# Patient Record
Sex: Female | Born: 1975 | Hispanic: Yes | Marital: Married | Smoking: Never smoker
Health system: Southern US, Community
[De-identification: ages and names within clinical notes are randomized; demographics above are authoritative.]

## PROBLEM LIST (undated history)

## (undated) DIAGNOSIS — Z789 Other specified health status: Secondary | ICD-10-CM

## (undated) HISTORY — DX: Other specified health status: Z78.9

---

## 1998-01-12 ENCOUNTER — Inpatient Hospital Stay (HOSPITAL_COMMUNITY): Admission: AD | Admit: 1998-01-12 | Discharge: 1998-01-12 | Payer: Self-pay | Admitting: *Deleted

## 1998-01-13 ENCOUNTER — Encounter: Payer: Self-pay | Admitting: *Deleted

## 1998-01-15 ENCOUNTER — Inpatient Hospital Stay (HOSPITAL_COMMUNITY): Admission: AD | Admit: 1998-01-15 | Discharge: 1998-01-19 | Payer: Self-pay | Admitting: Obstetrics

## 1998-02-11 ENCOUNTER — Encounter: Admission: RE | Admit: 1998-02-11 | Discharge: 1998-02-11 | Payer: Self-pay | Admitting: Obstetrics

## 1998-03-05 ENCOUNTER — Inpatient Hospital Stay: Admission: AD | Admit: 1998-03-05 | Discharge: 1998-03-05 | Payer: Self-pay | Admitting: Obstetrics & Gynecology

## 1998-04-09 ENCOUNTER — Encounter: Admission: RE | Admit: 1998-04-09 | Discharge: 1998-04-09 | Payer: Self-pay | Admitting: Internal Medicine

## 1998-04-16 ENCOUNTER — Ambulatory Visit (HOSPITAL_COMMUNITY): Admission: RE | Admit: 1998-04-16 | Discharge: 1998-04-16 | Payer: Self-pay | Admitting: *Deleted

## 1998-05-06 ENCOUNTER — Encounter: Admission: RE | Admit: 1998-05-06 | Discharge: 1998-05-06 | Payer: Self-pay | Admitting: Obstetrics

## 1998-05-11 ENCOUNTER — Encounter: Admission: RE | Admit: 1998-05-11 | Discharge: 1998-05-11 | Payer: Self-pay | Admitting: Internal Medicine

## 1998-05-21 ENCOUNTER — Encounter: Admission: RE | Admit: 1998-05-21 | Discharge: 1998-05-21 | Payer: Self-pay | Admitting: Internal Medicine

## 1998-08-12 ENCOUNTER — Encounter: Admission: RE | Admit: 1998-08-12 | Discharge: 1998-08-12 | Payer: Self-pay | Admitting: Obstetrics

## 1998-08-20 ENCOUNTER — Inpatient Hospital Stay (HOSPITAL_COMMUNITY): Admission: AD | Admit: 1998-08-20 | Discharge: 1998-08-20 | Payer: Self-pay | Admitting: *Deleted

## 1998-10-08 ENCOUNTER — Ambulatory Visit (HOSPITAL_COMMUNITY): Admission: RE | Admit: 1998-10-08 | Discharge: 1998-10-08 | Payer: Self-pay | Admitting: Obstetrics

## 1998-12-14 ENCOUNTER — Ambulatory Visit (HOSPITAL_COMMUNITY): Admission: RE | Admit: 1998-12-14 | Discharge: 1998-12-14 | Payer: Self-pay | Admitting: *Deleted

## 1999-03-15 ENCOUNTER — Inpatient Hospital Stay (HOSPITAL_COMMUNITY): Admission: AD | Admit: 1999-03-15 | Discharge: 1999-03-15 | Payer: Self-pay | Admitting: *Deleted

## 1999-03-24 ENCOUNTER — Inpatient Hospital Stay (HOSPITAL_COMMUNITY): Admission: AD | Admit: 1999-03-24 | Discharge: 1999-03-24 | Payer: Self-pay | Admitting: *Deleted

## 1999-03-28 ENCOUNTER — Encounter (HOSPITAL_COMMUNITY): Admission: AD | Admit: 1999-03-28 | Discharge: 1999-03-30 | Payer: Self-pay | Admitting: Obstetrics

## 1999-03-29 ENCOUNTER — Inpatient Hospital Stay (HOSPITAL_COMMUNITY): Admission: AD | Admit: 1999-03-29 | Discharge: 1999-03-31 | Payer: Self-pay | Admitting: Obstetrics

## 2003-12-08 ENCOUNTER — Ambulatory Visit: Payer: Self-pay | Admitting: Family Medicine

## 2003-12-18 ENCOUNTER — Ambulatory Visit: Payer: Self-pay | Admitting: Family Medicine

## 2004-03-07 ENCOUNTER — Emergency Department (HOSPITAL_COMMUNITY): Admission: EM | Admit: 2004-03-07 | Discharge: 2004-03-07 | Payer: Self-pay | Admitting: Emergency Medicine

## 2004-03-23 ENCOUNTER — Ambulatory Visit: Payer: Self-pay | Admitting: Family Medicine

## 2004-03-30 ENCOUNTER — Ambulatory Visit: Payer: Self-pay | Admitting: Family Medicine

## 2004-04-14 ENCOUNTER — Ambulatory Visit: Payer: Self-pay | Admitting: Family Medicine

## 2004-05-03 ENCOUNTER — Ambulatory Visit: Payer: Self-pay | Admitting: Sports Medicine

## 2004-06-02 ENCOUNTER — Ambulatory Visit: Payer: Self-pay | Admitting: Family Medicine

## 2004-06-03 ENCOUNTER — Ambulatory Visit (HOSPITAL_COMMUNITY): Admission: RE | Admit: 2004-06-03 | Discharge: 2004-06-03 | Payer: Self-pay | Admitting: Family Medicine

## 2004-06-10 ENCOUNTER — Ambulatory Visit (HOSPITAL_COMMUNITY): Admission: RE | Admit: 2004-06-10 | Discharge: 2004-06-10 | Payer: Self-pay | Admitting: Family Medicine

## 2004-06-11 ENCOUNTER — Ambulatory Visit: Payer: Self-pay | Admitting: Obstetrics and Gynecology

## 2004-06-11 ENCOUNTER — Ambulatory Visit (HOSPITAL_COMMUNITY): Admission: RE | Admit: 2004-06-11 | Discharge: 2004-06-11 | Payer: Self-pay | Admitting: Obstetrics & Gynecology

## 2004-06-28 ENCOUNTER — Ambulatory Visit: Payer: Self-pay | Admitting: Obstetrics & Gynecology

## 2004-07-01 ENCOUNTER — Ambulatory Visit: Payer: Self-pay | Admitting: Family Medicine

## 2004-09-15 ENCOUNTER — Ambulatory Visit: Payer: Self-pay | Admitting: Family Medicine

## 2004-09-21 ENCOUNTER — Ambulatory Visit: Payer: Self-pay | Admitting: Family Medicine

## 2004-10-11 ENCOUNTER — Ambulatory Visit: Payer: Self-pay | Admitting: Family Medicine

## 2004-10-29 ENCOUNTER — Inpatient Hospital Stay (HOSPITAL_COMMUNITY): Admission: AD | Admit: 2004-10-29 | Discharge: 2004-10-29 | Payer: Self-pay | Admitting: Obstetrics & Gynecology

## 2004-10-31 ENCOUNTER — Inpatient Hospital Stay (HOSPITAL_COMMUNITY): Admission: AD | Admit: 2004-10-31 | Discharge: 2004-10-31 | Payer: Self-pay | Admitting: Obstetrics and Gynecology

## 2004-11-02 ENCOUNTER — Inpatient Hospital Stay (HOSPITAL_COMMUNITY): Admission: AD | Admit: 2004-11-02 | Discharge: 2004-11-02 | Payer: Self-pay | Admitting: Obstetrics and Gynecology

## 2004-11-15 ENCOUNTER — Ambulatory Visit: Payer: Self-pay | Admitting: Obstetrics and Gynecology

## 2004-12-02 ENCOUNTER — Ambulatory Visit: Payer: Self-pay | Admitting: *Deleted

## 2005-08-01 ENCOUNTER — Ambulatory Visit: Payer: Self-pay | Admitting: Family Medicine

## 2005-08-16 ENCOUNTER — Ambulatory Visit (HOSPITAL_COMMUNITY): Admission: RE | Admit: 2005-08-16 | Discharge: 2005-08-16 | Payer: Self-pay | Admitting: Obstetrics & Gynecology

## 2005-08-30 ENCOUNTER — Ambulatory Visit: Payer: Self-pay | Admitting: Family Medicine

## 2005-09-28 ENCOUNTER — Ambulatory Visit: Payer: Self-pay | Admitting: Family Medicine

## 2005-10-02 ENCOUNTER — Ambulatory Visit (HOSPITAL_COMMUNITY): Admission: RE | Admit: 2005-10-02 | Discharge: 2005-10-02 | Payer: Self-pay | Admitting: Obstetrics & Gynecology

## 2005-10-23 ENCOUNTER — Ambulatory Visit: Payer: Self-pay | Admitting: Family Medicine

## 2005-10-26 ENCOUNTER — Ambulatory Visit: Payer: Self-pay | Admitting: Family Medicine

## 2005-11-02 ENCOUNTER — Ambulatory Visit: Payer: Self-pay | Admitting: Family Medicine

## 2005-12-05 ENCOUNTER — Ambulatory Visit: Payer: Self-pay | Admitting: Family Medicine

## 2005-12-12 ENCOUNTER — Ambulatory Visit (HOSPITAL_COMMUNITY): Admission: RE | Admit: 2005-12-12 | Discharge: 2005-12-12 | Payer: Self-pay | Admitting: Obstetrics & Gynecology

## 2005-12-18 ENCOUNTER — Ambulatory Visit: Payer: Self-pay | Admitting: Sports Medicine

## 2006-01-05 ENCOUNTER — Ambulatory Visit: Payer: Self-pay | Admitting: Family Medicine

## 2006-01-10 ENCOUNTER — Ambulatory Visit: Payer: Self-pay | Admitting: Family Medicine

## 2006-01-16 HISTORY — PX: DILATION AND CURETTAGE OF UTERUS: SHX78

## 2006-01-18 ENCOUNTER — Ambulatory Visit: Payer: Self-pay | Admitting: Family Medicine

## 2006-01-25 ENCOUNTER — Ambulatory Visit: Payer: Self-pay | Admitting: Family Medicine

## 2006-01-29 ENCOUNTER — Ambulatory Visit: Payer: Self-pay | Admitting: Obstetrics and Gynecology

## 2006-01-29 ENCOUNTER — Inpatient Hospital Stay (HOSPITAL_COMMUNITY): Admission: AD | Admit: 2006-01-29 | Discharge: 2006-01-29 | Payer: Self-pay | Admitting: Gynecology

## 2006-02-02 ENCOUNTER — Ambulatory Visit: Payer: Self-pay | Admitting: Family Medicine

## 2006-02-13 ENCOUNTER — Ambulatory Visit: Payer: Self-pay | Admitting: Family Medicine

## 2006-02-16 ENCOUNTER — Ambulatory Visit: Payer: Self-pay | Admitting: Family Medicine

## 2006-02-16 ENCOUNTER — Inpatient Hospital Stay (HOSPITAL_COMMUNITY): Admission: AD | Admit: 2006-02-16 | Discharge: 2006-02-18 | Payer: Self-pay | Admitting: Obstetrics & Gynecology

## 2006-02-16 ENCOUNTER — Ambulatory Visit: Payer: Self-pay | Admitting: *Deleted

## 2006-04-16 ENCOUNTER — Ambulatory Visit: Payer: Self-pay | Admitting: Sports Medicine

## 2006-04-16 ENCOUNTER — Encounter: Payer: Self-pay | Admitting: Family Medicine

## 2006-04-16 LAB — CONVERTED CEMR LAB
MCV: 90.4 fL
Platelets: 304 10*3/uL
RBC: 4.39 M/uL
TSH: 1.323 microintl units/mL (ref 0.350–5.50)

## 2006-05-17 ENCOUNTER — Ambulatory Visit: Payer: Self-pay | Admitting: Sports Medicine

## 2006-06-14 ENCOUNTER — Telehealth (INDEPENDENT_AMBULATORY_CARE_PROVIDER_SITE_OTHER): Payer: Self-pay | Admitting: *Deleted

## 2006-06-15 ENCOUNTER — Encounter (INDEPENDENT_AMBULATORY_CARE_PROVIDER_SITE_OTHER): Payer: Self-pay | Admitting: Family Medicine

## 2006-06-15 ENCOUNTER — Ambulatory Visit: Payer: Self-pay | Admitting: Family Medicine

## 2006-06-15 DIAGNOSIS — Q828 Other specified congenital malformations of skin: Secondary | ICD-10-CM | POA: Insufficient documentation

## 2006-06-15 LAB — CONVERTED CEMR LAB
Bilirubin Urine: NEGATIVE
GC Probe Amp, Genital: NEGATIVE
Ketones, urine, test strip: NEGATIVE
Nitrite: NEGATIVE
Specific Gravity, Urine: 1.02
Urobilinogen, UA: 0.2
Whiff Test: NEGATIVE

## 2006-06-27 ENCOUNTER — Telehealth (INDEPENDENT_AMBULATORY_CARE_PROVIDER_SITE_OTHER): Payer: Self-pay | Admitting: Family Medicine

## 2006-07-06 ENCOUNTER — Encounter (INDEPENDENT_AMBULATORY_CARE_PROVIDER_SITE_OTHER): Payer: Self-pay | Admitting: Family Medicine

## 2006-07-06 ENCOUNTER — Ambulatory Visit: Payer: Self-pay | Admitting: Family Medicine

## 2006-07-06 LAB — CONVERTED CEMR LAB
Beta hcg, urine, semiquantitative: NEGATIVE
Bilirubin Urine: NEGATIVE
Glucose, Urine, Semiquant: NEGATIVE
Specific Gravity, Urine: 1.025
pH: 6

## 2006-09-24 ENCOUNTER — Ambulatory Visit: Payer: Self-pay | Admitting: Sports Medicine

## 2006-09-24 LAB — CONVERTED CEMR LAB
KOH Prep: POSITIVE
Whiff Test: NEGATIVE

## 2006-10-02 ENCOUNTER — Encounter (INDEPENDENT_AMBULATORY_CARE_PROVIDER_SITE_OTHER): Payer: Self-pay | Admitting: Family Medicine

## 2006-10-03 ENCOUNTER — Encounter (INDEPENDENT_AMBULATORY_CARE_PROVIDER_SITE_OTHER): Payer: Self-pay | Admitting: Family Medicine

## 2006-10-03 ENCOUNTER — Ambulatory Visit (HOSPITAL_COMMUNITY): Admission: RE | Admit: 2006-10-03 | Discharge: 2006-10-03 | Payer: Self-pay | Admitting: Sports Medicine

## 2006-11-01 ENCOUNTER — Encounter: Payer: Self-pay | Admitting: Family Medicine

## 2006-11-01 ENCOUNTER — Ambulatory Visit: Payer: Self-pay | Admitting: Family Medicine

## 2006-11-01 LAB — CONVERTED CEMR LAB
Basophils Absolute: 0 10*3/uL (ref 0.0–0.1)
Eosinophils Relative: 1 % (ref 0–5)
Glucose, Urine, Semiquant: NEGATIVE
Hepatitis B Surface Ag: NEGATIVE
Lymphocytes Relative: 26 % (ref 12–46)
Neutro Abs: 5.3 10*3/uL (ref 1.7–7.7)
Neutrophils Relative %: 67 % (ref 43–77)
Platelets: 301 10*3/uL (ref 150–400)
Protein, U semiquant: NEGATIVE
RDW: 15.5 % — ABNORMAL HIGH (ref 11.5–14.0)
Rubella: 64.7 intl units/mL — ABNORMAL HIGH
WBC: 7.8 10*3/uL (ref 4.0–10.5)

## 2006-11-15 ENCOUNTER — Ambulatory Visit: Payer: Self-pay | Admitting: Family Medicine

## 2006-11-15 ENCOUNTER — Encounter (INDEPENDENT_AMBULATORY_CARE_PROVIDER_SITE_OTHER): Payer: Self-pay | Admitting: Family Medicine

## 2006-11-15 ENCOUNTER — Encounter: Payer: Self-pay | Admitting: Family Medicine

## 2006-11-15 LAB — CONVERTED CEMR LAB
Chlamydia, DNA Probe: NEGATIVE
GC Probe Amp, Genital: NEGATIVE
Pap Smear: NORMAL
Whiff Test: NEGATIVE

## 2006-12-05 ENCOUNTER — Encounter (INDEPENDENT_AMBULATORY_CARE_PROVIDER_SITE_OTHER): Payer: Self-pay | Admitting: Family Medicine

## 2006-12-05 ENCOUNTER — Ambulatory Visit (HOSPITAL_COMMUNITY): Admission: RE | Admit: 2006-12-05 | Discharge: 2006-12-05 | Payer: Self-pay | Admitting: Family Medicine

## 2006-12-18 ENCOUNTER — Ambulatory Visit: Payer: Self-pay | Admitting: Family Medicine

## 2006-12-18 LAB — CONVERTED CEMR LAB
Glucose, Urine, Semiquant: NEGATIVE
Protein, U semiquant: NEGATIVE

## 2007-01-22 ENCOUNTER — Ambulatory Visit: Payer: Self-pay | Admitting: Family Medicine

## 2007-01-22 ENCOUNTER — Encounter (INDEPENDENT_AMBULATORY_CARE_PROVIDER_SITE_OTHER): Payer: Self-pay | Admitting: Family Medicine

## 2007-01-22 ENCOUNTER — Encounter: Payer: Self-pay | Admitting: *Deleted

## 2007-01-22 LAB — CONVERTED CEMR LAB
Hemoglobin: 12.5 g/dL (ref 12.0–15.0)
Lymphocytes Relative: 18 % (ref 12–46)
Monocytes Absolute: 1 10*3/uL (ref 0.1–1.0)
Monocytes Relative: 9 % (ref 3–12)
Neutro Abs: 8.3 10*3/uL — ABNORMAL HIGH (ref 1.7–7.7)
Nitrite: POSITIVE
Protein, U semiquant: 30
RBC: 4.07 M/uL (ref 3.87–5.11)
Specific Gravity, Urine: 1.015

## 2007-01-23 ENCOUNTER — Ambulatory Visit: Payer: Self-pay | Admitting: Family Medicine

## 2007-02-06 ENCOUNTER — Ambulatory Visit: Payer: Self-pay | Admitting: Family Medicine

## 2007-02-06 ENCOUNTER — Encounter: Payer: Self-pay | Admitting: Family Medicine

## 2007-02-07 ENCOUNTER — Encounter (INDEPENDENT_AMBULATORY_CARE_PROVIDER_SITE_OTHER): Payer: Self-pay | Admitting: Family Medicine

## 2007-02-25 ENCOUNTER — Ambulatory Visit: Payer: Self-pay | Admitting: Family Medicine

## 2007-02-25 ENCOUNTER — Encounter: Payer: Self-pay | Admitting: Family Medicine

## 2007-02-25 LAB — CONVERTED CEMR LAB: Glucose, Urine, Semiquant: NEGATIVE

## 2007-03-05 ENCOUNTER — Encounter (INDEPENDENT_AMBULATORY_CARE_PROVIDER_SITE_OTHER): Payer: Self-pay | Admitting: Family Medicine

## 2007-03-05 ENCOUNTER — Ambulatory Visit: Payer: Self-pay | Admitting: Family Medicine

## 2007-03-06 LAB — CONVERTED CEMR LAB: Glucose, 2 hour: 125 mg/dL (ref 70–139)

## 2007-03-13 ENCOUNTER — Ambulatory Visit: Payer: Self-pay | Admitting: Family Medicine

## 2007-03-13 ENCOUNTER — Encounter: Payer: Self-pay | Admitting: Family Medicine

## 2007-03-13 LAB — CONVERTED CEMR LAB

## 2007-04-01 ENCOUNTER — Ambulatory Visit: Payer: Self-pay | Admitting: Sports Medicine

## 2007-04-16 ENCOUNTER — Encounter (INDEPENDENT_AMBULATORY_CARE_PROVIDER_SITE_OTHER): Payer: Self-pay | Admitting: Family Medicine

## 2007-04-16 ENCOUNTER — Ambulatory Visit: Payer: Self-pay | Admitting: Family Medicine

## 2007-04-16 LAB — CONVERTED CEMR LAB: GC Probe Amp, Genital: NEGATIVE

## 2007-04-24 ENCOUNTER — Ambulatory Visit: Payer: Self-pay | Admitting: Family Medicine

## 2007-04-24 LAB — CONVERTED CEMR LAB: Protein, U semiquant: NEGATIVE

## 2007-04-30 ENCOUNTER — Inpatient Hospital Stay (HOSPITAL_COMMUNITY): Admission: AD | Admit: 2007-04-30 | Discharge: 2007-05-02 | Payer: Self-pay | Admitting: Obstetrics and Gynecology

## 2007-04-30 ENCOUNTER — Ambulatory Visit: Payer: Self-pay | Admitting: *Deleted

## 2007-04-30 ENCOUNTER — Ambulatory Visit: Payer: Self-pay | Admitting: Obstetrics and Gynecology

## 2007-04-30 ENCOUNTER — Encounter (INDEPENDENT_AMBULATORY_CARE_PROVIDER_SITE_OTHER): Payer: Self-pay | Admitting: Family Medicine

## 2007-05-08 ENCOUNTER — Telehealth: Payer: Self-pay | Admitting: *Deleted

## 2007-05-10 ENCOUNTER — Ambulatory Visit: Payer: Self-pay | Admitting: Family Medicine

## 2007-05-10 ENCOUNTER — Encounter (INDEPENDENT_AMBULATORY_CARE_PROVIDER_SITE_OTHER): Payer: Self-pay | Admitting: Family Medicine

## 2007-05-11 LAB — CONVERTED CEMR LAB
Basophils Absolute: 0 K/uL (ref 0.0–0.1)
Basophils Relative: 0 % (ref 0–1)
Eosinophils Absolute: 0.1 K/uL (ref 0.0–0.7)
Eosinophils Relative: 1 % (ref 0–5)
HCT: 36.2 % (ref 36.0–46.0)
Hemoglobin: 11.7 g/dL — ABNORMAL LOW (ref 12.0–15.0)
Lymphocytes Relative: 25 % (ref 12–46)
Lymphs Abs: 2.5 K/uL (ref 0.7–4.0)
MCHC: 32.3 g/dL (ref 30.0–36.0)
MCV: 94.3 fL (ref 78.0–100.0)
Monocytes Absolute: 0.6 K/uL (ref 0.1–1.0)
Monocytes Relative: 6 % (ref 3–12)
Neutro Abs: 7 K/uL (ref 1.7–7.7)
Neutrophils Relative %: 68 % (ref 43–77)
Platelets: 456 K/uL — ABNORMAL HIGH (ref 150–400)
RBC: 3.84 M/uL — ABNORMAL LOW (ref 3.87–5.11)
RDW: 15.2 % (ref 11.5–15.5)
WBC: 10.2 10*3/microliter (ref 4.0–10.5)

## 2007-06-13 ENCOUNTER — Ambulatory Visit: Payer: Self-pay | Admitting: Family Medicine

## 2007-06-21 ENCOUNTER — Encounter: Payer: Self-pay | Admitting: *Deleted

## 2007-07-02 ENCOUNTER — Ambulatory Visit: Payer: Self-pay | Admitting: Family Medicine

## 2007-07-02 ENCOUNTER — Encounter: Payer: Self-pay | Admitting: Family Medicine

## 2007-07-02 ENCOUNTER — Encounter (INDEPENDENT_AMBULATORY_CARE_PROVIDER_SITE_OTHER): Payer: Self-pay | Admitting: Family Medicine

## 2007-07-02 LAB — CONVERTED CEMR LAB: Beta hcg, urine, semiquantitative: NEGATIVE

## 2007-07-09 ENCOUNTER — Encounter: Payer: Self-pay | Admitting: *Deleted

## 2007-08-12 ENCOUNTER — Ambulatory Visit: Payer: Self-pay | Admitting: Family Medicine

## 2008-06-08 ENCOUNTER — Encounter (INDEPENDENT_AMBULATORY_CARE_PROVIDER_SITE_OTHER): Payer: Self-pay | Admitting: Family Medicine

## 2008-06-08 ENCOUNTER — Ambulatory Visit: Payer: Self-pay | Admitting: Family Medicine

## 2008-06-08 DIAGNOSIS — N898 Other specified noninflammatory disorders of vagina: Secondary | ICD-10-CM | POA: Insufficient documentation

## 2008-06-08 LAB — CONVERTED CEMR LAB: Hemoglobin: 14.5 g/dL

## 2008-06-10 ENCOUNTER — Encounter (INDEPENDENT_AMBULATORY_CARE_PROVIDER_SITE_OTHER): Payer: Self-pay | Admitting: Family Medicine

## 2010-10-11 LAB — CBC
HCT: 39.7
HCT: 27 — ABNORMAL LOW
HCT: 30.8 — ABNORMAL LOW
Hemoglobin: 13.8
Hemoglobin: 10.8 — ABNORMAL LOW
Hemoglobin: 9.5 — ABNORMAL LOW
MCHC: 35.2
MCHC: 35.3
MCV: 91.6
MCV: 91.9
Platelets: 230
RBC: 4.33
RBC: 2.93 — ABNORMAL LOW
RDW: 15.5
RDW: 15.9 — ABNORMAL HIGH
WBC: 8.9

## 2012-02-16 ENCOUNTER — Encounter: Payer: Self-pay | Admitting: Family Medicine

## 2012-02-16 ENCOUNTER — Ambulatory Visit (INDEPENDENT_AMBULATORY_CARE_PROVIDER_SITE_OTHER): Payer: Self-pay | Admitting: Family Medicine

## 2012-02-16 VITALS — BP 119/76 | HR 75 | Temp 98.5°F | Ht 65.0 in | Wt 165.0 lb

## 2012-02-16 DIAGNOSIS — H6990 Unspecified Eustachian tube disorder, unspecified ear: Secondary | ICD-10-CM | POA: Insufficient documentation

## 2012-02-16 DIAGNOSIS — H6980 Other specified disorders of Eustachian tube, unspecified ear: Secondary | ICD-10-CM | POA: Insufficient documentation

## 2012-02-16 DIAGNOSIS — Z309 Encounter for contraceptive management, unspecified: Secondary | ICD-10-CM

## 2012-02-16 NOTE — Patient Instructions (Signed)
It was nice to see you.  Please fill out the paperwork for the Mirena, and also apply for the [employer].  If you are able to get the Mirena and the [employer] by April, we can do your Well Woman Exam, Pap smear, and change the Mirena during one visit.

## 2012-02-16 NOTE — Assessment & Plan Note (Signed)
No discharge or abnormalities on exam today- feel the popping is probably eustachian tube dysfunction, discussed connection of ears and nose, reassured her no infection.

## 2012-02-16 NOTE — Progress Notes (Signed)
  Subjective:    Patient ID: Teresa Orozco, female    DOB: 1975/11/27, 37 y.o.   MRN: [id number]  HPI:  Teresa Orozco comes in to re-establish care.  She has not been seen here in almost 5 years.  She has a Mirena in place, which will expire in April.  She says she would like a new Mirena if possible.   She complains about some popping and drainage from her right ear.  Denies pain. No fevers.  This comes and goes.   History reviewed. No pertinent past medical history.  History  Substance Use Topics  . Smoking status: Never Smoker   . Smokeless tobacco: Never Used  . Alcohol Use: No    History reviewed. No pertinent family history.   ROS Pertinent items in HPI    Objective:  Physical Exam:  BP 119/76  Pulse 75  Temp 98.5 F (36.9 C) (Oral)  Ht 5\' 5"  (1.651 m)  Wt 165 lb (74.844 kg)  BMI 27.46 kg/m2 General appearance: alert, cooperative and no distress Ears: Normal canals and TM's.  Head: Normocephalic, without obvious abnormality, atraumatic Lungs: clear to auscultation bilaterally Heart: regular rate and rhythm, S1, S2 normal, no murmur, click, rub or gallop Pulses: 2+ and symmetric       Assessment & Plan:

## 2012-02-16 NOTE — Assessment & Plan Note (Signed)
Discussed re-applying for Mirena scholarship to get a new one, and I have also asked her to apply for the orange card so we can do her Pap.  F/U in April before Mirena expires.

## 2012-03-22 ENCOUNTER — Ambulatory Visit: Payer: Self-pay | Admitting: Family Medicine

## 2012-04-10 ENCOUNTER — Ambulatory Visit: Payer: Self-pay | Admitting: Family Medicine

## 2012-04-17 ENCOUNTER — Ambulatory Visit (INDEPENDENT_AMBULATORY_CARE_PROVIDER_SITE_OTHER): Payer: Self-pay | Admitting: Family Medicine

## 2012-04-17 ENCOUNTER — Encounter: Payer: Self-pay | Admitting: Family Medicine

## 2012-04-17 VITALS — BP 106/71 | HR 72 | Temp 98.5°F | Ht 65.0 in | Wt 165.0 lb

## 2012-04-17 DIAGNOSIS — Z309 Encounter for contraceptive management, unspecified: Secondary | ICD-10-CM

## 2012-04-17 MED ORDER — LEVONORGESTREL 20 MCG/24HR IU IUD
INTRAUTERINE_SYSTEM | Freq: Once | INTRAUTERINE | Status: AC
  Administered 2012-04-17: 12:00:00 via INTRAUTERINE

## 2012-04-17 NOTE — Patient Instructions (Signed)
Intrauterine Device Insertion Care After Refer to this sheet in the next few weeks. These instructions provide you with information on caring for yourself after your procedure. Your caregiver may also give you more specific instructions. Your treatment has been planned according to current medical practices, but problems sometimes occur. Call your caregiver if you have any problems or questions after your procedure. HOME CARE INSTRUCTIONS   Only take over-the-counter or prescription medicines for pain, discomfort, or fever as directed by your caregiver. Do not use aspirin. This may increase bleeding.  Check your IUD to make sure it is in place before you resume sexual activity. You should be able to feel the strings. If you cannot feel the strings, something may be wrong. The IUD may have fallen out of the uterus, or the uterus may have been punctured (perforated) during placement. Also, if the strings are getting longer, it may mean that the IUD is being forced out of the uterus. You no longer have full protection from pregnancy if any of these problems occur.  You may resume sexual intercourse if you are not having problems with the IUD. The IUD is considered immediately effective.  You may resume normal activities.  Keep all follow-up appointments to be sure your IUD has remained in place. After the first exam, yearly exams are advised, unless you cannot feel the strings of your IUD.  Continue to check that the IUD is still in place by feeling for the strings after every menstrual period. SEEK MEDICAL CARE IF:   You have bleeding that is heavier or lasts longer than a normal menstrual cycle.  You have a fever.  You have increasing cramps or abdominal pain not relieved with medicine.  You have abdominal pain that does not seem to be related to the same area of earlier cramping and pain.  You are lightheaded, unusually weak, or faint.  You have abnormal vaginal discharge or  smells.  You have pain during sexual intercourse.  You cannot feel the IUD strings, or the IUD string has gotten longer.  You feel the IUD at the opening of the cervix in the vagina.  You think you are pregnant, or you miss your menstrual period.  The IUD string is hurting your sex partner. Document Released: 08/31/2010 Document Revised: 03/27/2011 Document Reviewed: 08/31/2010 [facility] Patient Information 2013 [place], [place].  Insercin de dispositivo intrauterino Cuidados despus de Consulte esta hoja en las prximas semanas . Estas instrucciones le proporcionan informacin sobre el cuidado de s mismo despus de su procedimiento. Su mdico tambin le puede dar instrucciones ms especficas . Su tratamiento se ha planificado de acuerdo a las prcticas mdicas actuales , pero los problemas a veces ocurren . Llame a su mdico si usted tiene algn problema o preguntas despus de su procedimiento. INSTRUCCIONES PARA EL CUIDADO DOMICILIARIO Slo tomar over-the- counter o medicamentos recetados para el dolor , la incomodidad, o fiebre como lo indique su mdico . No use aspirina. Esto puede aumentar el sangrado . Revise su DIU para asegurarse de que est en su lugar antes de reanudar la actividad sexual. Usted debe ser capaz de sentir las cuerdas. Si usted no puede sentir los hilos, algo puede estar mal . El DIU puede haber cado en el tero o el tero puede haber sido perforado ( perforada ) durante la colocacin. Adems, si las cuerdas se alargan, puede significar que el DIU est siendo forzado a salir del tero. Ya no tendr proteccin completa contra el embarazo si se  presenta cualquiera de estos problemas. Usted puede [employer] sexuales si no tiene problemas con el DIU. El DIU es considerada efectiva de inmediato . Puede reanudar sus actividades normales. Mantenga todas las citas de seguimiento para asegurarse de que su DIU se ha Museum/gallery conservator. Despus del primer examen,  se les recomienda realizarse exmenes anuales , a menos que usted no puede sentir los hilos del DIU . Contine revisando que el DIU sigue en su lugar por el sentimiento de las cuerdas despus de cada perodo menstrual. BUSQUE ATENCIN MDICA SI : Tiene sangrado es ms abundante o dura ms de un ciclo menstrual normal. Usted tiene una fiebre. Has aumentar los calambres o dolor abdominal que no se alivia con medicamentos. Tiene dolor abdominal que no parece estar relacionado con la misma rea de calambres antes y dolor. Usted est mareado , inusitadamente dbil o se desmaya . Tiene flujo vaginal anormal o olores. Tiene dolor durante las [employer] . Usted no puede sentir los hilos del DIU , o el hilo del DIU ha conseguido ya . Se siente el DIU en la abertura del cuello uterino en la vagina. Usted piensa que est [place] , o no tiene su perodo menstrual. El hilo del DIU est haciendo dao a su pareja sexual . Documento de lanzamiento : 08/31/2010 el documento revisado el : 03/27/2011 Documento Resea: 08/31/2010 [employer]  Informacin para el Paciente  [address] , [place] .

## 2012-04-17 NOTE — Progress Notes (Signed)
IUD Removal Procedure Note  Patient positioned in Lithotomy position, Speculum placed, Strings visualized and IUD removed easily with ringed forceps.   IUD Insertion Procedure Note  Pre-operative Diagnosis: Need for contraception  Post-operative Diagnosis: same  Indications: contraception  Procedure Details  Urine pregnancy test was done and result was Negative.  The risks (including infection, bleeding, pain, and uterine perforation) and benefits of the procedure were explained to the patient and Written informed consent was obtained.    Cervix cleansed with Betadine. Uterus sounded to 8 cm. IUD inserted without difficulty. String visible and trimmed. Patient tolerated procedure well.  IUD Information: Mirena.  Condition: Stable  Complications: None  Plan:  The patient was advised to call for any fever or for prolonged or severe pain or bleeding. She was advised to use NSAID as needed for mild to moderate pain.

## 2013-04-08 ENCOUNTER — Ambulatory Visit (INDEPENDENT_AMBULATORY_CARE_PROVIDER_SITE_OTHER): Payer: Self-pay | Admitting: Family Medicine

## 2013-04-08 ENCOUNTER — Encounter: Payer: Self-pay | Admitting: Family Medicine

## 2013-04-08 VITALS — BP 121/79 | HR 80 | Ht 65.0 in | Wt 163.9 lb

## 2013-04-08 DIAGNOSIS — Z309 Encounter for contraceptive management, unspecified: Secondary | ICD-10-CM

## 2013-04-08 DIAGNOSIS — T8332XA Displacement of intrauterine contraceptive device, initial encounter: Secondary | ICD-10-CM

## 2013-04-08 DIAGNOSIS — T8389XA Other specified complication of genitourinary prosthetic devices, implants and grafts, initial encounter: Principal | ICD-10-CM

## 2013-04-08 DIAGNOSIS — T8339XA Other mechanical complication of intrauterine contraceptive device, initial encounter: Secondary | ICD-10-CM

## 2013-04-08 LAB — POCT URINE PREGNANCY: PREG TEST UR: NEGATIVE

## 2013-04-08 NOTE — Assessment & Plan Note (Addendum)
A: suspected IUD migration. Patient and her sex partner cannot feel strings x 2 months. Unable to visualize on exam. P: Ultrasound evaluation Patient provided info to apply for orange card as she is currently uninsured.  PCP f/u to discuss removal, patient desires contraception will likely need gyn referral for IUD removal.  Advised patient to start PNV as she desires pregnancy in the near future.

## 2013-04-08 NOTE — Patient Instructions (Signed)
Teresa Orozco,   Thank you for coming in today. I have placed a referral for ultrasound.   Please f/u with Dr. Birdie SonsSonnenberg to discuss next steps for IUD removal. Please start prenatal vitamin as we discussed.   Dr. Armen PickupFunches

## 2013-04-08 NOTE — Progress Notes (Signed)
   Subjective:    Patient ID: Cheron SchaumannAna Bahena, female    DOB: 08/30/1975, 38 y.o.   MRN: [id number]  HPI 38 yo hispanic female presents for OV:  1. Check IUD: placed 04/2012. Has not felt strings x 2 months. Her sexual partner has not felt strings. Patient has menstrual cramps x one week. No vaginal bleeding. Has 4 children 17, 14, 7, 5. Desires pregnancy. Would like IUD removed if strings visualized today.   Soc hx: no smoking, no ETOH.  Review of Systems As per HPI     Objective:   Physical Exam BP 121/79  Pulse 80  Ht 5\' 5"  (1.651 m)  Wt 163 lb 14.4 oz (74.345 kg)  BMI 27.27 kg/m2 General appearance: alert, cooperative and no distress Abdomen: NABS, soft, NT, ND Pelvis:  Normal external genitalia. Speculum: normal vagina. Normal cervix with copious mucus and large ectropion. Nabothian cyst at 6 o'clock. Could not visualize IUD strings: explored cervix with cytobrush and q tips still could not visualize IUD strings.        Assessment & Plan:

## 2013-04-11 ENCOUNTER — Other Ambulatory Visit: Payer: Self-pay

## 2013-04-16 ENCOUNTER — Other Ambulatory Visit: Payer: Self-pay

## 2013-07-11 ENCOUNTER — Emergency Department (HOSPITAL_COMMUNITY): Payer: Self-pay

## 2013-07-11 ENCOUNTER — Encounter (HOSPITAL_COMMUNITY): Payer: Self-pay | Admitting: Emergency Medicine

## 2013-07-11 ENCOUNTER — Emergency Department (HOSPITAL_COMMUNITY)
Admission: EM | Admit: 2013-07-11 | Discharge: 2013-07-11 | Disposition: A | Discharge status: Home / Self Care | Payer: Self-pay | Attending: Emergency Medicine | Admitting: Emergency Medicine

## 2013-07-11 DIAGNOSIS — Y92410 Unspecified street and highway as the place of occurrence of the external cause: Secondary | ICD-10-CM | POA: Insufficient documentation

## 2013-07-11 DIAGNOSIS — IMO0002 Reserved for concepts with insufficient information to code with codable children: Secondary | ICD-10-CM | POA: Insufficient documentation

## 2013-07-11 DIAGNOSIS — Y9389 Activity, other specified: Secondary | ICD-10-CM | POA: Insufficient documentation

## 2013-07-11 DIAGNOSIS — M549 Dorsalgia, unspecified: Secondary | ICD-10-CM

## 2013-07-11 DIAGNOSIS — R0789 Other chest pain: Secondary | ICD-10-CM

## 2013-07-11 DIAGNOSIS — S298XXA Other specified injuries of thorax, initial encounter: Secondary | ICD-10-CM | POA: Insufficient documentation

## 2013-07-11 MED ORDER — METHOCARBAMOL 500 MG PO TABS: 500.0000 mg | ORAL_TABLET | Freq: Four times a day (QID) | ORAL | Status: DC | PRN

## 2013-07-11 MED ORDER — IBUPROFEN 800 MG PO TABS: 800.0000 mg | ORAL_TABLET | Freq: Three times a day (TID) | ORAL | Status: DC | PRN
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
## 2013-07-20 NOTE — ED Provider Notes (Signed)
Medical screening examination/treatment/procedure(s) were performed by non-physician practitioner and as supervising physician I was immediately available for consultation/collaboration.   EKG Interpretation None       Conni Knighton, MD 07/20/13 2022 

## 2014-03-16 ENCOUNTER — Encounter: Payer: Self-pay | Admitting: Family Medicine

## 2014-03-16 ENCOUNTER — Ambulatory Visit (INDEPENDENT_AMBULATORY_CARE_PROVIDER_SITE_OTHER): Payer: Self-pay | Admitting: Family Medicine

## 2014-03-16 VITALS — BP 134/91 | HR 83 | Temp 98.1°F | Ht 65.0 in | Wt 163.0 lb

## 2014-03-16 DIAGNOSIS — T8332XD Displacement of intrauterine contraceptive device, subsequent encounter: Secondary | ICD-10-CM

## 2014-03-16 DIAGNOSIS — T8389XD Other specified complication of genitourinary prosthetic devices, implants and grafts, subsequent encounter: Secondary | ICD-10-CM

## 2014-03-16 NOTE — Progress Notes (Signed)
   Teresa Orozco is a 39 y.o. female [facility].   HPI:  IUD Migration Patient reports that she had Mirena placed 2 years ago. Around a year ago, she noticed that she nor her partner could feel the stringers so [facility]. At that time, the strings were unable to be visualized in the cervical os even after exploration with a cytobrush and q-tip. Patient was then referred to have an ultrasound evaluation, however never followed up. Patient presents today (1 year since last visit) for evaluation. States that she still has not felt strings. Currently wishes to have IUD removed. Has not had a period for the past year. No other vaginal bleeding or discharge. No fevers. No severe abdominal pain.   Patient wishes to have bedside ultrasound today.   ROS: As per HPI  Past Medical History - Reviewed and updated Patient Active Problem List   Diagnosis Date Noted  . 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 Encounter for contraceptive management 02/16/2012  . Eustachian tube dysfunction 02/16/2012  . KERATOSIS PILARIS 06/15/2006    Medications- reviewed and updated Current Outpatient Prescriptions  Medication Sig Dispense Refill  . acetaminophen (TYLENOL) 500 MG tablet Take 1,000 mg by mouth every 6 (six) hours as needed for headache (pain).    Marland Kitchen. ibuprofen (ADVIL,MOTRIN) 800 MG tablet Take 1 tablet (800 mg total) by mouth every 8 (eight) hours as needed for mild pain or moderate pain (Tome 1 tableta cada 8 horas si lo necesita para dolor). 15 tablet 0  . levonorgestrel (MIRENA) 20 MCG/24HR IUD 1 each by Intrauterine route once. Will expire April 2019.    . methocarbamol (ROBAXIN) 500 MG tablet Take 1 tablet (500 mg total) by mouth every 6 (six) hours as needed for muscle spasms (Tome una tableta cada 6 horas si lo necesita para dolor). 20 tablet 0   No current facility-administered medications for this visit.    Objective: Physical  Exam: BP 134/91 mmHg  Pulse 83  Temp(Src) 98.1 F (36.7 C) (Oral)  Ht 5\' 5"  (1.651 m)  Wt 163 lb (73.936 kg)  BMI 27.12 kg/m2  Gen: NAD, resting comfortably CV: RRR with no murmurs appreciated Lungs: NWOB, CTAB with no crackles, wheezes, or rhonchi Abdomen: Soft, Nontender, Nondistended. Ext: no edema Skin: warm, dry Neuro: grossly normal, moves all extremities  A/P: See problem list  IUD migration IUD unlikely to be expelled given that patient has not had period in over a year. No red flag symptoms for perforation. No speculum exam or ultrasound today as it will not change management as patient wishes to have IUD removed and strings were previously not visualized. Will refer to gynecology for extraction.   Precepted with Dr Jennette KettleNeal.     Orders Placed This Encounter  Procedures  . Ambulatory referral to Gynecology    Referral Priority:  Routine    Referral Type:  Consultation    Referral Reason:  Specialty Services Required    Requested Specialty:  Gynecology    Number of Visits Requested:  1    Caleb M. Jimmey RalphParker, MD [facility] Resident PGY-1 03/16/2014 11:15 AM

## 2014-03-16 NOTE — Assessment & Plan Note (Addendum)
IUD unlikely to be expelled given that patient has not had period in over a year. No red flag symptoms for perforation. No speculum exam or ultrasound today as it will not change management as patient wishes to have IUD removed and strings were previously not visualized. Will refer to gynecology for extraction.   Precepted with Dr Jennette KettleNeal.

## 2014-03-16 NOTE — Patient Instructions (Signed)
Thank you for coming to the clinic today. It was nice seeing you.  For your IUD, we [facility].

## 2014-03-20 ENCOUNTER — Telehealth: Payer: Self-pay | Admitting: General Practice

## 2014-03-20 ENCOUNTER — Encounter: Payer: Self-pay | Admitting: General Practice

## 2014-03-20 DIAGNOSIS — T8332XA Displacement of intrauterine contraceptive device, initial encounter: Secondary | ICD-10-CM

## 2014-03-20 DIAGNOSIS — T8389XA Other specified complication of genitourinary prosthetic devices, implants and grafts, initial encounter: Principal | ICD-10-CM

## 2014-03-20 NOTE — Telephone Encounter (Addendum)
Patient needs ultrasound prior to appt in office due to missing IUD strings. Scheduled ultrasound for 3/22 @ 1045. Called patient with Teresa PerkingMaria Orozco for interpreter and husband answered stating he currently was not with her and would call us back with her number. Patient's husband called back and provided number of [phone]. Called patient with Teresa PerkingMaria Orozco and informed patient of ultrasound scheduled for 3/22 @ 10:45 and appt in our office of 4/14 @ 12:45. Patient verbalized understanding to all and had no questions

## 2014-04-07 ENCOUNTER — Ambulatory Visit (HOSPITAL_COMMUNITY)
Admission: RE | Admit: 2014-04-07 | Discharge: 2014-04-07 | Disposition: A | Discharge status: Home / Self Care | Payer: Self-pay | Source: Ambulatory Visit | Attending: Family Medicine | Admitting: Family Medicine

## 2014-04-07 DIAGNOSIS — T8332XA Displacement of intrauterine contraceptive device, initial encounter: Secondary | ICD-10-CM

## 2014-04-07 DIAGNOSIS — T8389XA Other specified complication of genitourinary prosthetic devices, implants and grafts, initial encounter: Secondary | ICD-10-CM | POA: Insufficient documentation

## 2014-04-30 ENCOUNTER — Encounter: Payer: Self-pay | Admitting: Obstetrics & Gynecology

## 2014-04-30 ENCOUNTER — Ambulatory Visit (INDEPENDENT_AMBULATORY_CARE_PROVIDER_SITE_OTHER): Payer: Self-pay | Admitting: Obstetrics & Gynecology

## 2014-04-30 VITALS — BP 127/79 | HR 79 | Wt 168.0 lb

## 2014-04-30 DIAGNOSIS — Z30432 Encounter for removal of intrauterine contraceptive device: Secondary | ICD-10-CM

## 2014-04-30 MED ORDER — PRENATAL VITAMINS 0.8 MG PO TABS: 1.0000 | ORAL_TABLET | Freq: Every day | ORAL | Status: DC

## 2014-04-30 NOTE — Patient Instructions (Signed)
Prenatal Vitamin and Mineral Combinations (oral solid dosage forms) Qu es este medicamento? Las PG&E Corporationcombinaciones de VITAMINA Y MINERAL PRENATAL se utilizan [place]antes, durante y despus de un embarazo para ayudar a suministrar una buena nutricin. Este medicamento puede ser utilizado para otros usos; si tiene alguna pregunta consulte con su proveedor de atencin mdica o con su farmacutico. MARCAS COMERCIALES DISPONIBLES: Active OB, Advanced Care Plus, Advanced NatalCare, Advanced-RF NatalCare, Aminate Fe, Anemagen OB, B-Nexa, BP FoliNatal Plus B, BP MultiNatal Plus, BP MultiNatal Plus Chewable, BP Prenate, Brainstrong, Bright Beginnings Prenatal, Cal-Nate, Calcium PNV, CareNatal DHA, CareNate 600, Cavan One Omega, Cavan Prenatal with EC Calcium, Cavan-Alpha, Cavan-EC SOD DHA, Cavan-Heme OB, Cavan-Heme Omega, Cenogen Ultra, Administrator, sportsCentrum Specialist Prenatal, CertaVite with Antioxidants, Choice-OB + DHA, Citracal Prenatal, Citracal Prenatal + DHA, CitraNatal 90 DHA, CitraNatal Assure, CitraNatal B-Calm, CitraNatal DHA, CitraNatal Harmony, CitraNatal Rx, Classic Prenatal, ComBi Rx, Complete Natal DHA, Complete-RF, CompleteNate, Concept DHA, Concept OB, CoreNate-DHA, Corvite FE with Quatrefolic, CRNatal DHA, Daily Vitamin, Docosavit, Duet, Duet Chewable, Duet DHA, Duet DHA 400, Duet DHA 430ec, Duet DHA Balanced, Duet DHA Complete, Duet DHA Complete Gluten Free, Duet DHA EC, Duet DHA Ferrazone, EC Omega-3, DuoVit DHA, Edge OB, Elite OB, Elite OB with DHA, Elite-OB 400, Extra-Virt Plus, Femecal OB, Femecal OB Plus DHA, Ferrocite Plus, Folbecal, Folcal DHA, [address]Fol[address] Care One, [address]Fol[address] Omega 3, FidelityFolet One with LewellenQuatrefolic, Folivane OB, Folivane-EC Calcium DHA NF, Foltabs 90 Plus DHA, Foltabs Prenatal, Foltabs Prenatal Plus DHA, Gesticare, Gesticare DHA, Gesticare DHA Delayed-Release, HemeNatal OB, HemeNatal OB + DHA, Hemocyte Plus, HIP Prenatal, ICAR Prenatal Rx, iNatal Advance, iNatal GT, iNatal Ultra, Infanate Balance, Infanate  DHA, Infanate Plus, Kolnatal, Lactocal-F, Levomefolate PNV, MACNATAL CN DHA, Marnatal-F, Marnatal-F Plus, Materna, Maxinate, Mission Prenatal, Mission Prenatal F.A., Mission Prenatal H.P., Mom's Choice Rx, Multi-Nate 30, Multi-Nate 30 DHA, Multi-Nate DHA Extra, Multifol Plus, Multivitamin With Minerals, MyNatal OB Prenatal, NataCaps, NataChew, NataFolic-OB, Natafort, Natal-V RX, NatalCare CFe 60, NatalCare GlossTabs, NatalCare PIC, C.H. Robinson WorldwideatalCare PIC Forte, United ParcelatalCare Plus, Schering-PloughatalCare Rx, Air Products and ChemicalsatalCare Three, NATALVIRT 90 DHA, NATALVIRT CA, NataTab CFe, NataTab FA, Natatab Rx, Natelle, Natelle C, Natelle One, Hess Corporationatelle Plus with DHA, Natelle Prefer, Natelle-ez, Navatab + DHA, Neevo, Neevo DHA, Nestabs, Nestabs ABC, Nestabs CBF, Nestabs DHA, Nestabs FA, Nestabs Rx, New Advanced Formula Prenatal Z, Nexa Plus, Nexa Select, Niferex-PN, Niferex-PN Forte, NovaNatal, NovaStart, Nu-Natal, NutraCare, Nutri-Tab OB, Nutri-Tab OB + DHA, Nutrinate, NutriSpire, O-Cal F.A., O-Cal Prenatal, OB Choice, OB Complete, OB Complete 400, OB Complete One, OB Complete Petite, OB Complete Premier, OB Complete with DHA, OB-Natal One, Obstetrix-100, Obtrex, Obtrex DHA, One-A-Day Women's, One-A-Day Women's Prenatal, OptiNate, Paire OB Tablet Plus DHA, PNV OB + DHA, PNV Prenatal, PNV Prenatal Plus Multivitamin, PNV Tabs 29-1, PNV-DHA, PNV-DHA + Docusate, PNV-DHA Plus, PNV-First, PNV-Iron, PNV-OB with DHA, PNV-Omega, PNV-Select, PNV-Total with DHA, PNV-VP-U, PR Serbiaatal 400, PR Serbiaatal 400ec, PR [address] 430, PR [address] 430ec, PR Serbiaatal 440ec, PreCare, PreferaOB, PreferaOB + DHA, PreferaOB One, Premesis Rx, [address]Prena[address] PEARL, [address]Prena1 Plus, ConconullyPrenaCare, Elmira HeightsPrenafirst, WineglassPrenaissance, Prenaissance 90 DHA, Prenaissance Balance, Prenaissance DHA, Prenaissance Harmony DHA, Prenaissance Next, Prenaissance Plus, Prenaissance Promise, PrenaPlus, Prenat with Quatrefolic, PreNata Multivitamin with Iron, Prenatabs CBF, Prenatabs FA, Prenatabs OBN, Prenatabs RX, Prenatal, Prenatal 1 Plus 1,  Prenatal 19, Prenatal AD, Prenatal Formula 3, Prenatal H, Prenatal Low Iron, Prenatal MR 90 Fe, Prenatal MTR with Selenium, Prenatal Multivitamin + DHA 2, Prenatal Optima Advance, Prenatal Plus, Prenatal Plus Iron, Prenatal Plus Low Iron, Prenatal Rx with Beta Carotene, Prenatal S, Prenatal U, Prenatal Vitamin, PreNatal Vitamins Plus, Prenate Advance,  Prenate AM with Quatrefolic, Prenate DHA, Prenate Elite, Prenate Enhance with Massachusetts Mutual LifeQuatrefolic, [address]Pren[address] Essential, Prenate GT, Prenate Mini, PreNate Plus, Prenate Restore with Massachusetts Mutual LifeQuatrefolic, [address]Pren[address] Star, Prenate Ultra, Prenavite, Hovnanian EnterprisesPrenavite Protein, PreNexa, Yahoo! IncPreNexa Premier, PrePLUS, PreQue, PreTAB, Previte Rx, PrimaCare, Rockwell AutomationPrimaCare Advantage, PrimaCare ONE, Provida OB, PruEt DHA, PruEt DHAec, PureFe OB Plus, PureFe Plus, PureVit DualFe Plus, RE DualVit OB, RE DualVit Plus, RE OB + DHA, RE OB 90 + DHA, RE Prenatal, RE PreVit + DHA, RE-Nata 29, RE-Nata 29 OB, Reaphrim, Renate, Renate DHA, Renate DHA Extra, REocyte Plus, Right Step, Rovin-Nv, Rovin-Nv DHA, Se-Care, Se-Care Conceive, Se-Care Gesture, Se-Natal 19, Se-Natal 19 Chewable, Se-Natal 90, Se-Natal ONE, Se-Plete DHA, Se-Tan DHA, Se-Tan Plus, Select-OB, Select-OB + DHA, SetonET, SetonET-EC DHA, StrongStart, StrongStart Chewable Tablet, Lu DuffelStuart One, Sempra EnergyStuart Prenatal, Stuart Prenatal + DHA, Stuartnatal Plus 3, Tandem DHA, Tandem OB, Tandem Plus, Taron A Prenatal Pack with DHA, Taron EC Calcium DHA Pack, Taron Prenatal with DHA, Taron-C DHA, Taron-EC Cal, Taron-Prex Prenatal with DHA, Thera Serbiaatal Complete, Thera Serbiaatal Core Nutrition, Thera Serbiaatal Lactation Support, Thera [address] OvaVite, Thera NIKEatal Plus, Melvillehera-Tabs, TL-Care DHA, TL-Select, TL-Select DHA, Tri Rx, Apache CorporationriAdvance, TriCare, TriCare Prenatal DHA ONE, Trifera OB, Trimesis Rx, Trinatal GT, [address]verinatal Rx 1, Trinate, Triveen-Duo DHA, Triveen-PRx RNF, Triveen-Ten, Trust Harley-Davidsonatal DHA, UltimateCare Advantage, UltimateCare Combo, EconomistUltimateCare ONE, UltimateCare ONE NF, Coca ColaUltra NatalCare,  VemaVite-PRx 2, Vena-Bal DHA, Venatal-FA, Verotin-BY, Verotin-GR, Vinacal, Vinacal B, Vinatal Forte, Vinate 90, Vinate AZ, Vinate AZ Extra, Vinate C, Vinate Calcium, Vinate Care, Vinate DHA, Vinate GT, Vinate IC, [place]thageVinate II, PortlandVinate III, Vinate M Low Iron, Vinate One, Vinate PN, Vinate Ultra, Virt Nate, Virt-Advance, Virt-C DHA, Virt-Care One, Virt-PN, Virt-PN DHA, Virt-PN Plus, Virt-Select, Virt-Vite GT, VirtPrex, Vitafol PN, Vitafol Ultra, Vitafol-Nano Prenatal, Vitafol-OB, Vitafol-OB + DHA, Vitafol-OB and DHA, Vitafol-One, VitaMed MD Plus Rx, VitaNatal OB Plus DHA, vitaPearl Prenatal, VitaPhil, VitaPhil + DHA, VitaPhil + DHA 90, VitaPhil AiDE, VitaSpire, Viva CT, VIVA DHA, Vol-Tab Rx, VP CH Ultra, VP-CH-PNV, VP-GGR-B6 Prenatal, VP-HEME OB, VP-HEME OB+ DHA, VP-PNV-DHA, Zatean-CH, Zatean-Pn, Zatean-Pn DHA, Zatean-Pn Plus, Zingiber Qu le debo informar a mi profesional de la salud antes de tomar este medicamento? Necesita saber si usted presenta alguno de los Coventry Health Caresiguientes problemas o situaciones: -trastorno de sangrado o coagulacin -antecedentes de anemia de cualquier tipo -otra afliccin de la salud crnica -una reaccin alrgica o inusual a vitaminas, minerales, a otros medicamentos, alimentos, colorantes o conservantes Cmo debo SLM Corporationutilizar este medicamento? Tome este medicamento por va oral con un vaso de agua. Usted puede tomarlo con o sin alimentos. Si le produce malestar estomacal, tmelo con alimentos. Las tabletas de vitaminas masticables prenatales se pueden Product managermasticar completamente antes de tragar. Siga las instrucciones de la etiqueta del [place]woodmedicamento. La dosis usual es una tableta una vez al da. No tome su medicamento con una frecuencia mayor a la indicada. Hable con su pediatra para informarse acerca del uso de este medicamento en nios. Puede requerir atencin especial. Este medicamento para las mujeres quienes estn [place] Parkembarazadas, Tolleyamamantado o pueden quedar [place] Viewembarazadas. Sobredosis: Pngase en  contacto inmediatamente con un centro toxicolgico o una sala de urgencia si usted cree que haya tomado demasiado medicamento. ATENCIN: Reynolds AmericanEste medicamento es solo para usted. No comparta este medicamento con nadie. Qu sucede si me olvido de una dosis? Si olvida una dosis, tmela lo antes posible. Si es casi la hora de la prxima dosis, tome slo esa dosis. No tome dosis adicionales o dobles. Qu puede interactuar con este medicamento? -alendronato -anticidos -cefdinir -cefditoren -etidronato -antibiticos fluoroquinolnicos (por ejemplo:  ciprofloxacina, gatifloxacino, levofloxacino) -ibandronato -levodopa -risedronato -antibiticos tetraciclnicos (por ejemplo: doxiciclina, minociclina, tetraciclina) -hormonas tiroideas -warfarina Puede ser que esta lista no menciona todas las posibles interacciones. Informe a su profesional de [employer] Homes de Ingram [employer] Inc productos a base de hierbas, medicamentos de Tatum o suplementos nutritivos que est tomando. Si usted fuma, consume bebidas alcohlicas o si utiliza drogas ilegales, indqueselo tambin a su profesional de [employer] Homes. Algunas sustancias pueden interactuar con su medicamento. A qu debo estar atento al usar PPL Corporation? Visite a su profesional de la salud para International aid/development worker su evolucin peridicamente de su progreso. Recuerde que los suplementos de vitamina y minerales no reemplazan la buena nutricin de [place] dieta equilibrada. Las heces cambian de color comnmente cuando se toman vitaminas y [place]. Notifique a su profesional de la salud si este cambio es preocupante o acompaado con otros sntomas, como dolor abdominal. Qu efectos secundarios puedo tener al [place] medicamento? Efectos secundarios que debe informar a su mdico o a Producer, television/film/video de la salud tan pronto como sea posible: -Therapist, art como erupcin cutnea o dificultad para respirar -vmito Efectos secundarios que, por lo general, no requieren atencin  mdica (debe informarlos a su mdico o a su profesional de la salud si persisten o si son molestos): -nuseas -Programme researcher, broadcasting/film/video Puede ser que esta lista no menciona todos los posibles efectos secundarios. Comunquese a su mdico por asesoramiento mdico Hewlett-Packard. Usted puede informar los efectos secundarios a la FDA por telfono al 1-800-FDA-1088. Dnde debo guardar mi medicina? Mantngala fuera del alcance de los nios. La mayora de las vitaminas y minerales se deben guardar a Architectural technologist. Compruebe las instrucciones de su producto. Protjala del calor y de la humedad. Deseche todo el medicamento que no haya utilizado, despus de la fecha de vencimiento. ATENCIN: Este folleto es un resumen. Puede ser que no cubra toda la posible informacin. Si usted tiene preguntas acerca de esta medicina, consulte con su mdico, su farmacutico o su profesional de Radiographer, therapeutic.  2015, Elsevier/Gold Standard. (2013-04-30 13:40:20)

## 2014-04-30 NOTE — Progress Notes (Signed)
Subjective:     Patient ID: Teresa SchaumannAna Riffe, female   DOB: 01/23/1975, 39 y.o.   MRN: [id number]  HPI Pt reports attempt to have IUD removed by primary care.  She reports that it was not able to be removed and she was told that it would need be removed operatively.  She denies problems with her IUD.  She is interested in removal and subsequent contraception.        Review of Systems     Objective:   Physical Exam BP 127/79 mmHg  Pulse 79  Wt 168 lb (76.204 kg) Pt in NAD  Patient was in the dorsal lithotomy position, normal external genitalia was noted.  A speculum was placed in the patient's vagina, normal discharge was noted, no lesions. The multiparous cervix was visualized, no lesions, no abnormal discharge;  and the cervix was swabbed with Betadine using scopettes. The strings of the IUD were not visualized, so Kelly forceps were introduced into the endometrial cavity and the IUD was grasped and removed in its entirety.  Patient tolerated the procedure well.       CLINICAL DATA: Missing IUD string  03/2014 EXAM: TRANSABDOMINAL AND TRANSVAGINAL ULTRASOUND OF PELVIS  TECHNIQUE: Both transabdominal and transvaginal ultrasound examinations of the pelvis were performed. Transabdominal technique was performed for global imaging of the pelvis including uterus, ovaries, adnexal regions, and pelvic cul-de-sac. It was necessary to proceed with endovaginal exam following the transabdominal exam to visualize the uterus, endometrium, ovaries and adnexa .  COMPARISON: CT 03/07/2004  FINDINGS: Uterus  Measurements: 9.0 x 5.0 x 5.7 cm. No fibroids or other mass visualized.  Endometrium  Thickness: IUD noted in place within the endometrium in expected position. Endometrium 9 mm in thickness. No focal abnormality visualized.  Right ovary  Measurements: 4.0 x 3.5 x 2.8 cm. Small follicles, all less than 3 cm. Normal appearance/no adnexal mass.  Left ovary  Measurements:  3.1 x 2.1 x 2.9 cm. Normal appearance/no adnexal mass.  Other findings  No free fluid.  IMPRESSION: IUD within expected position within the endometrium. No acute findings. Assessment:     IUD removal - uncomplicated Contraception counseling- pt reports that she is interested in getting pregnant     Plan:     PNV 1 po q day F/u prn

## 2014-06-04 ENCOUNTER — Telehealth: Payer: Self-pay | Admitting: General Practice

## 2014-06-04 NOTE — Telephone Encounter (Signed)
Patient called and left message in spanish stating one month ago she had the mirena IUD removed and she has not had a period yet and wants to know if this is normal or not. Marly used for interpreter. Called patient back and discussed with her that it can take a couple months to get her period back once the IUD is removed. Patient states she has felt symptoms of pregnancy and wants to know if she could be pregnant. Told patient that is possible. Patient states she took a home upt and it was negative. Told patient to wait two weeks and take another pregnancy test. Patient states if she was not pregnant then why the occasional pain/pressure down there. Told patient it could be her body adjusting to its normal hormones and to give her body time to adjust. Patient verbalized understanding and had no other questions

## 2014-12-03 ENCOUNTER — Encounter (INDEPENDENT_AMBULATORY_CARE_PROVIDER_SITE_OTHER): Payer: Self-pay

## 2014-12-03 LAB — POCT PREGNANCY, URINE: Preg Test, Ur: POSITIVE — AB

## 2015-01-06 ENCOUNTER — Telehealth: Payer: Self-pay | Admitting: General Practice

## 2015-01-06 NOTE — Telephone Encounter (Signed)
Patient called and left message stating she has a horrible sore throat and feels terrible & wants to know what she can take. Patient left message in spanish

## 2015-01-07 NOTE — Telephone Encounter (Signed)
Left message for patient stating she can take OTC tylenol and used sore throat drops. Pt can also follow up with her primary care provider regarding symptoms.

## 2015-01-14 ENCOUNTER — Ambulatory Visit (INDEPENDENT_AMBULATORY_CARE_PROVIDER_SITE_OTHER): Payer: Self-pay | Admitting: Family Medicine

## 2015-01-14 ENCOUNTER — Encounter: Payer: Self-pay | Admitting: Family Medicine

## 2015-01-14 VITALS — BP 116/75 | HR 89 | Temp 98.0°F | Wt 172.0 lb

## 2015-01-14 DIAGNOSIS — Z124 Encounter for screening for malignant neoplasm of cervix: Secondary | ICD-10-CM

## 2015-01-14 DIAGNOSIS — O99321 Drug use complicating pregnancy, first trimester: Secondary | ICD-10-CM

## 2015-01-14 DIAGNOSIS — Z1151 Encounter for screening for human papillomavirus (HPV): Secondary | ICD-10-CM

## 2015-01-14 DIAGNOSIS — O09529 Supervision of elderly multigravida, unspecified trimester: Secondary | ICD-10-CM | POA: Insufficient documentation

## 2015-01-14 DIAGNOSIS — O2341 Unspecified infection of urinary tract in pregnancy, first trimester: Secondary | ICD-10-CM

## 2015-01-14 DIAGNOSIS — F1910 Other psychoactive substance abuse, uncomplicated: Principal | ICD-10-CM

## 2015-01-14 DIAGNOSIS — F1990 Other psychoactive substance use, unspecified, uncomplicated: Secondary | ICD-10-CM

## 2015-01-14 DIAGNOSIS — O0991 Supervision of high risk pregnancy, unspecified, first trimester: Secondary | ICD-10-CM

## 2015-01-14 DIAGNOSIS — Z113 Encounter for screening for infections with a predominantly sexual mode of transmission: Secondary | ICD-10-CM

## 2015-01-14 DIAGNOSIS — O0990 Supervision of high risk pregnancy, unspecified, unspecified trimester: Secondary | ICD-10-CM | POA: Insufficient documentation

## 2015-01-14 LAB — POCT URINALYSIS DIP (DEVICE)
Bilirubin Urine: NEGATIVE
GLUCOSE, UA: NEGATIVE mg/dL
Ketones, ur: NEGATIVE mg/dL
NITRITE: NEGATIVE
PROTEIN: NEGATIVE mg/dL
SPECIFIC GRAVITY, URINE: 1.025 (ref 1.005–1.030)
UROBILINOGEN UA: 0.2 mg/dL (ref 0.0–1.0)
pH: 6.5 (ref 5.0–8.0)

## 2015-01-14 NOTE — Progress Notes (Signed)
Genetic Counseling and US scheduled for January 11th @ 1130

## 2015-01-14 NOTE — Progress Notes (Signed)
Nutrition note: 1st visit consult Pt has gained 7# @ 5960w3d, which is > expected. Pt reports eating ~6-7x/d. Pt is taking a PNV. Pt reports having nausea in the morning and having heartburn often. NKFA. Pt received verbal & written education on general nutrition during pregnancy in Spanish via an interpreter. Discussed tips to decrease nausea & heartburn. Discussed wt gain goals of 15-25# or 0.6#/wk. Pt agrees to continue taking a PNV. Pt does not have WIC but plans to apply. Pt plans to BF. F/u as needed Blondell RevealLaura Quinita Kostelecky, MS, RD, LDN, [facility] IncBCLC

## 2015-01-14 NOTE — Progress Notes (Signed)
Subjective:  Teresa Orozco is a 39 y.o. [id number] at 212w3d being seen today for ongoing prenatal care.  [name] and has KERATOSIS PILARIS; Eustachian tube dysfunction; Supervision of high risk pregnancy, antepartum; and AMA (advanced maternal age) multigravida 35+ on her problem list.  Patient reports no complaints.  Contractions: Not present.  .  Movement: Present. Denies leaking of fluid.   The following portions of the patient's history were reviewed and updated as appropriate: allergies, current medications, past family history, past medical history, past social history, past surgical history and problem list. Problem list updated.  Objective:   Filed Vitals:   01/14/15 0806  BP: 116/75  Pulse: 89  Temp: 98 F (36.7 C)  Weight: 172 lb (78.019 kg)    Fetal Status: Fetal Heart Rate (bpm): 155   Movement: Present     General:  Alert, oriented and cooperative. Patient is in no acute distress.  Skin: Skin is warm and dry. No rash noted.   Cardiovascular: Normal heart rate noted  Respiratory: Normal respiratory effort, no problems with respiration noted  Abdomen: Soft, gravid, appropriate for gestational age. Pain/Pressure: Present     Pelvic:       Cervical exam deferred        Extremities: Normal range of motion.  Edema: None  Mental Status: Normal mood and affect. Normal behavior. Normal judgment and thought content.   Urinalysis: Urine Protein: Negative Urine Glucose: Negative  Assessment and Plan:  Pregnancy: [id number] at 582w3d  1. 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 Supervision of high risk pregnancy, antepartum, first trimester See above   Preterm labor symptoms and general obstetric precautions including but not limited to vaginal bleeding, contractions, leaking of fluid and fetal movement were reviewed in detail with the patient. Please refer to After Visit Summary for other counseling recommendations.  Return in about 4 weeks (around 02/11/2015) for Routine prenatal care.   Federico FlakeKimberly Niles Azelea Seguin, MD

## 2015-01-14 NOTE — Patient Instructions (Signed)
Las medicinas seguras para tomar Academic librarian  Safe Medications in Pregnancy  Acn:  Benzoyl Peroxide (Perxido de benzolo)  Salicylic Acid (cido saliclico)  Dolor de espalda/Dolor de cabeza:  Tylenol: 2 pastillas de concentracin regular cada 4 horas O 2 pastillas de concentracin fuerte cada 6 horas  Resfriados/Tos/Alergias:  Benadryl (sin alcohol) 25 mg cada 6 horas segn lo necesite Breath Right strips (Tiras para respirar correctamente)  Claritin  Cepacol (pastillas de chupar para la garganta)  Chloraseptic (aerosol para la garganta)  Cold-Eeze- hasta tres veces por da  Cough drops (pastillas de chupar para la tos, sin alcohol)  Flonase (con receta mdica solamente)  Guaifenesin  Mucinex  Robitussin DM (simple solamente, sin alcohol)  Saline nasal spray/drops (Aerosol nasal salino/gotas) Sudafed (pseudoephedrine) y  Actifed * utilizar slo despus de 12 semanas de gestacin y si no tiene la presin arterial alta.  Tylenol Vicks  VapoRub  Zinc lozenges (pastillas para la garganta)  Zyrtec  Estreimiento:  Colace  Ducolax (supositorios)  Fleet enema (lavado intestinal rectal)  Glycerin (supositorios)  Metamucil  Milk of magnesia (leche de magnesia)  Miralax  Senokot  Smooth Move (t)  Diarrea:  Kaopectate Imodium A-D  *NO tome Pepto-Bismol  Hemorroides:  Anusol  Anusol HC  Preparation H  Tucks  Indigestin:  Tums  Maalox  Mylanta  Zantac  Pepcid  Insomnia:  Benadryl (sin alcohol) 25mg  cada 6 horas segn lo necesite  Tylenol PM  Unisom, no Gelcaps  Calambres en las piernas:  Tums  MagGel Nuseas/Vmitos:  Bonine  Dramamine  Emetrol  Ginger (extracto)  Sea-Bands  Meclizine  Medicina para las nuseas que puede tomar durante el embarazo: Unisom (doxylamine succinate, pastillas de 25 mg) Tome una pastilla al da al [place]. Si los sntomas no estn adecuadamente controlados, la dosis puede aumentarse hasta una dosis mxima recomendada de [employer] al da (1/2 pastilla por la [place], 1/2 pastilla a media tarde y Neomia Dear pastilla al [place]). Pastillas de Vitamina B6 de 100mg . Tome [employer] veces al da (hasta 200 mg por da).  Erupciones en la piel:  Productos de Aveeno  Benadryl cream (crema o una dosis de 25mg  cada 6 horas segn lo necesite)  Calamine Lotion (locin)  1% cortisone cream (crema de cortisona de 1%)  nfeccin vaginal por hongos (candidiasis):  Gyne-lotrimin 7  Monistat 7   **Si est tomando varias medicinas, por favor revise las etiquetas para Art gallery manager los mismos ingredientes [place]. **Tome la medicina segn lo indicado en la etiqueta. **No tome ms de 400 mg de Tylenol en 24 horas. **No tome medicinas que contengan aspirina o ibuprofeno.      Primer trimestre de Psychiatrist (First Trimester of Pregnancy) El primer trimestre de Psychiatrist se extiende desde la semana1 hasta el final de la semana12 (mes1 al mes3). Una semana despus de que un espermatozoide fecunda un vulo, este se implantar en la pared uterina. Este embrin comenzar a Camera operator convertirse en un beb. Sus genes y los de su pareja forman el beb. Los genes del varn determinan si ser un nio o una nia. Entre la semana6 y [place], se forman los ojos y [place], y los latidos del corazn pueden verse en la ecografa. Al final de las 12semanas, todos los rganos del beb estn formados.  Ahora que est embarazada, querr hacer todo lo que est a su alcance para tener un beb sano. Dos de las cosas ms importantes son Birdena Jubilee atencin prenatal y seguir  las indicaciones del mdico. La atencin prenatal incluye toda la asistencia mdica que usted recibe antes del nacimiento del beb. Esta ayudar a prevenir, Engineer, manufacturingdetectar y tratar cualquier problema durante el embarazo y Winfieldel parto. CAMBIOS EN EL ORGANISMO Su organismo atraviesa por muchos cambios durante el [place]bar[place], y estos varan de Neomia Dearuna mujer a Educational psychologistotra.   Al principio, puede  aumentar o bajar algunos kilos.  Puede tener Programme researcher, broadcasting/film/videomalestar estomacal (nuseas) y vomitar. Si no puede controlar los vmitos, llame al mdico.  Puede cansarse con facilidad.  Es posible que tenga dolores de cabeza que pueden aliviarse con los medicamentos que el mdico le permita tomar.  Puede orinar con mayor frecuencia. El dolor al orinar puede significar que usted tiene una infeccin de la vejiga.  Debido al Vanetta Muldersembarazo, puede tener acidez estomacal.  Puede estar estreida, ya que ciertas hormonas enlentecen los movimientos de los msculos que New York Life Insuranceempujan los desechos a travs de los intestinos.  Pueden aparecer hemorroides o abultarse e hincharse las venas (venas varicosas).  Las ConAgra Foodsmamas pueden empezar a Government social research officeragrandarse y Emergency planning/management officerestar sensibles. Los pezones pueden sobresalir ms, y el tejido que los rodea (areola) tornarse ms oscuro.  Las Veterinary surgeonencas pueden sangrar y estar sensibles al cepillado y al hilo dental.  Pueden aparecer zonas oscuras o manchas (cloasma, mscara del Psychiatristembarazo) en el rostro que probablemente se atenuarn despus del nacimiento del beb.  Los perodos menstruales se interrumpirn.  Tal vez no tenga apetito.  Puede sentir un fuerte deseo de consumir ciertos alimentos.  Puede tener cambios a Theatre managernivel emocional da a da, por ejemplo, por momentos puede estar emocionada por el Psychiatristembarazo y por otros preocuparse porque algo pueda salir mal con el embarazo o el beb.  Tendr sueos ms vvidos y extraos.  Tal vez haya cambios en el cabello que pueden incluir su engrosamiento, crecimiento rpido y cambios en la textura. A algunas mujeres tambin se les cae el cabello durante o despus del [place]embarazo, o tienen el cabello seco o fino. Lo ms probable es que el cabello se le normalice despus del nacimiento del beb. QU DEBE ESPERAR EN LAS CONSULTAS PRENATALES Durante una visita prenatal de rutina:  La pesarn para asegurarse de que usted y el beb estn creciendo normalmente.  Le controlarn la  presin arterial.  Le medirn el abdomen para controlar el desarrollo del beb.  Se escucharn los latidos cardacos a partir de la semana10 o la12 de embarazo, aproximadamente.  Se analizarn los resultados de los estudios solicitados en visitas anteriores. El mdico puede preguntarle:  Cmo se siente.  Si siente los movimientos del beb.  Si ha tenido sntomas anormales, como prdida de lquido, Albionsangrado, dolores de cabeza intensos o clicos abdominales.  Si est consumiendo algn producto que contenga tabaco, como cigarrillos, tabaco de Theatre managermascar y Administrator, Civil Servicecigarrillos electrnicos.  Si tiene Colgate-Palmolivealguna pregunta. Otros estudios que pueden realizarse durante el primer trimestre incluyen lo siguiente:  Anlisis de sangre para determinar el tipo de sangre y Engineer, manufacturingdetectar la presencia de infecciones previas. Adems, se los usar para controlar si los niveles de hierro son bajos (anemia) y Chief Strategy Officerdeterminar los anticuerpos Rh. En una etapa ms avanzada del Apisonembarazo, se harn anlisis de sangre para saber si tiene diabetes, junto con otros estudios si surgen problemas.  Anlisis de orina para detectar infecciones, diabetes o protenas en la orina.  Una ecografa para confirmar que el beb crece y se desarrolla correctamente.  Una amniocentesis para diagnosticar posibles problemas genticos.  Estudios del feto para descartar espina bfida y sndrome de [place].  Es posible que necesite otras pruebas adicionales.  Prueba del VIH (virus de inmunodeficiencia humana). Los exmenes prenatales de rutina incluyen la prueba de deteccin del VIH, a menos que decida no realizrsela. INSTRUCCIONES PARA EL CUIDADO EN EL HOGAR  Medicamentos:  Siga las indicacioneFutures traderico en relacin con el uso de medicamentos. Durante el embarazo, hay medicamentos que pueden tomarse y otros que no.  Tome las vitaminas prenatales como se le indic.  Si est estreida, tome un laxante suave, si el mdico lo [place]. Dieta  Consuma  alimentos balanceados. Elija alimentos variados, como carne o protenas de origen vegetal, pescado, leche y productos lcteos descremados, verduras, frutas y panes y Radiation protection practitioner. El mdico la ayudar a Production assistant, radio cantidad de peso que puede [place].  No coma carne cruda ni quesos sin cocinar. Estos elementos contienen bacterias que pueden causar defectos congnitos en el beb.  La ingesta diaria de cuatro o cinco comidas pequeas en lugar de tres comidas abundantes puede ayudar a Yahoo nuseas y los vmitos. Si empieza a tener nuseas, comer algunas [address] puede ser de [place]. Beber lquidos [place] comidas en lugar de tomarlos durante las comidas tambin puede ayudar a Optician, dispensing las nuseas y los vmitos.  Si est estreida, consuma alimentos con alto contenido de [place], como verduras y frutas frescas, y Radiation protection practitioner. Beba suficiente lquido para Photographer orina clara o de color amarillo plido. Actividad y Landscape architect ejercicio solamente como se lo haya indicado el mdico. El ejercicio la ayudar a:  Art gallery manager.  Mantenerse en forma.  Estar preparada para el trabajo de parto y [place].  Los dolores, los clicos en la parte baja del abdomen o los calambres en la cintura son un buen indicio de que debe dejar de Corporate treasurer. Consulte al mdico antes de seguir haciendo ejercicios normales.  Intente no estar de pie FedEx. Mueva las piernas con frecuencia si debe estar de pie en un lugar durante mucho tiempo.  Evite levantar pesos [employer].  Use zapatos de tacones bajos y [place].  Puede seguir teniendo [employer] [place] [employer], excepto que el mdico le indique lo contrario. Alivio del dolor o las molestias  Use un sostn que le brinde buen soporte si siente dolor a la palpacin Mattel.  Dese baos de asiento con agua tibia para Engineer, materials o las molestias causadas por las hemorroides. Use crema  antihemorroidal si el mdico se lo permite.  Descanse con las piernas elevadas si tiene calambres o dolor de cintura.  Si tiene venas varicosas en las piernas, use medias de descanso. Eleve los pies durante , 3 o 4veces por da. Limite la cantidad de sal en su dieta. Cuidados prenatales  Programe las visitas prenatales para la semana12 de [place]. Generalmente se programan cada mes al principio y se hacen ms frecuentes en los 2 ltimos meses antes del parto.  Escriba sus preguntas. Llvelas cuando concurra a las visitas prenatales.  Concurra a todas las visitas prenatales como se lo haya indicado el mdico. Seguridad  Colquese el cinturn de seguridad cuando conduzca.  Haga una lista de los nmeros de telfono de Associate Professor, que [employer] nmeros de telfono de familiares, [place], el hospital y los departamentos de polica y bomberos. Consejos generales  Pdale al mdico que la derive a clases de educacin prenatal en su localidad. Debe comenzar a tomar las clases antes de Cytogeneticist en el mes6 de embarazo.  Pida ayuda si  tiene necesidades nutricionales o de asesoramiento Academic librarian. El mdico puede aconsejarla o derivarla a especialistas para que la ayuden con diferentes necesidades.  No se d baos de inmersin en agua caliente, baos turcos ni saunas.  No se haga duchas vaginales ni use tampones o toallas higinicas perfumadas.  No mantenga las piernas cruzadas durante [place].  Evite el contacto con las bandejas sanitarias de los gatos y la tierra que estos animales usan. Estos elementos contienen bacterias que pueden causar defectos congnitos al beb y la posible prdida del feto debido a un aborto espontneo o muerte fetal.  No fume, no consuma hierbas ni medicamentos que no hayan sido recetados por el mdico. Las sustancias qumicas que estos productos contienen afectan la formacin y el desarrollo del beb.  No consuma ningn producto que contenga  tabaco, lo que incluye cigarrillos, tabaco de Theatre manager y Administrator, Civil Service. Si necesita ayuda para dejar de fumar, consulte al [employer] Express. Puede recibir asesoramiento y otro tipo de recursos para dejar de fumar.  Programe una cita con el dentista. En su casa, lvese los dientes con un cepillo dental blando y psese el hilo dental con suavidad. SOLICITE ATENCIN MDICA SI:   Tiene mareos.  Siente clicos leves, presin en la pelvis o dolor persistente en el abdomen.  Tiene nuseas, vmitos o diarrea persistentes.  Tiene secrecin vaginal con mal olor.  Siente dolor al [employer]ocoPhillips.  Tiene el rostro, las [place], las piernas o los tobillos ms hinchados. SOLICITE ATENCIN MDICA DE INMEDIATO SI:   Tiene fiebre.  Tiene una prdida de lquido por la vagina.  Tiene sangrado o pequeas prdidas vaginales.  Siente dolor intenso o clicos en el abdomen.  Sube o baja de peso rpidamente.  Vomita sangre de color rojo brillante o material que parezca granos de caf.  Ha estado expuesta a la rubola y no ha sufrido la enfermedad.  Ha estado expuesta a la quinta enfermedad o a la varicela.  Tiene un dolor de cabeza intenso.  Le falta el aire.  Sufre cualquier tipo de traumatismo, por ejemplo, debido a una cada o un accidente automovilstico.   Esta informacin no tiene Theme park manager el consejo del mdico. Asegrese de hacerle al mdico cualquier pregunta que tenga.   Document Released: 10/12/2004 Document Revised: 01/23/2014 Elsevier Interactive Patient Education [employer]

## 2015-01-15 LAB — PRENATAL PROFILE (SOLSTAS)
Antibody Screen: NEGATIVE
BASOS ABS: 0 10*3/uL (ref 0.0–0.1)
Basophils Relative: 0 % (ref 0–1)
EOS PCT: 1 % (ref 0–5)
Eosinophils Absolute: 0.1 10*3/uL (ref 0.0–0.7)
HEMATOCRIT: 36.7 % (ref 36.0–46.0)
HEP B S AG: NEGATIVE
HIV: NONREACTIVE
Hemoglobin: 12.4 g/dL (ref 12.0–15.0)
LYMPHS ABS: 2.2 10*3/uL (ref 0.7–4.0)
LYMPHS PCT: 21 % (ref 12–46)
MCH: 30.4 pg (ref 26.0–34.0)
MCHC: 33.8 g/dL (ref 30.0–36.0)
MCV: 90 fL (ref 78.0–100.0)
MPV: 9.8 fL (ref 8.6–12.4)
Monocytes Absolute: 0.6 10*3/uL (ref 0.1–1.0)
Monocytes Relative: 6 % (ref 3–12)
NEUTROS ABS: 7.5 10*3/uL (ref 1.7–7.7)
Neutrophils Relative %: 72 % (ref 43–77)
PLATELETS: 348 10*3/uL (ref 150–400)
RBC: 4.08 MIL/uL (ref 3.87–5.11)
RDW: 14.9 % (ref 11.5–15.5)
Rh Type: POSITIVE
Rubella: 3.31 Index — ABNORMAL HIGH (ref <0.90)
WBC: 10.4 10*3/uL (ref 4.0–10.5)

## 2015-01-15 LAB — PRESCRIPTION MONITORING PROFILE (19 PANEL)
Amphetamine/Meth: NEGATIVE ng/mL
BENZODIAZEPINE SCREEN, URINE: NEGATIVE ng/mL
BUPRENORPHINE, URINE: NEGATIVE ng/mL
Barbiturate Screen, Urine: NEGATIVE ng/mL
CANNABINOID SCRN UR: NEGATIVE ng/mL
COCAINE METABOLITES: NEGATIVE ng/mL
CREATININE, URINE: 170.66 mg/dL (ref >20.0)
Carisoprodol, Urine: NEGATIVE ng/mL
ECSTASY: NEGATIVE ng/mL
FENTANYL URINE: NEGATIVE ng/mL
MEPERIDINE UR: NEGATIVE ng/mL
METHADONE SCREEN, URINE: NEGATIVE ng/mL
METHAQUALONE SCREEN (URINE): NEGATIVE ng/mL
NITRITES URINE, INITIAL: NEGATIVE ug/mL
Opiate Screen, Urine: NEGATIVE ng/mL
Oxycodone Screen, Ur: NEGATIVE ng/mL
PH URINE, INITIAL: 7.1 pH (ref 4.5–8.9)
PROPOXYPHENE: NEGATIVE ng/mL
Phencyclidine, Ur: NEGATIVE ng/mL
Tapentadol, urine: NEGATIVE ng/mL
Tramadol Scrn, Ur: NEGATIVE ng/mL
ZOLPIDEM, URINE: NEGATIVE ng/mL

## 2015-01-15 LAB — CYTOLOGY - PAP

## 2015-01-16 DIAGNOSIS — O2340 Unspecified infection of urinary tract in pregnancy, unspecified trimester: Secondary | ICD-10-CM | POA: Insufficient documentation

## 2015-01-16 LAB — GC/CHLAMYDIA PROBE AMP (~~LOC~~) NOT AT ARMC
CHLAMYDIA, DNA PROBE: NEGATIVE
NEISSERIA GONORRHEA: NEGATIVE

## 2015-01-16 LAB — CULTURE, OB URINE

## 2015-01-16 MED ORDER — AMOXICILLIN-POT CLAVULANATE 875-125 MG PO TABS: 1.0000 | ORAL_TABLET | Freq: Two times a day (BID) | ORAL | Status: DC

## 2015-01-16 NOTE — Addendum Note (Signed)
Addended by: Geanie BerlinNEWTON, Kennth Vanbenschoten N on: 01/16/2015 08:22 PM   Modules accepted: Orders

## 2015-01-17 NOTE — L&D Delivery Note (Signed)
Delivery Note At 6:09 PM a viable and healthy female was delivered via Vaginal, Spontaneous Delivery (Presentation: LOA ).  APGAR: 8, 9; weight pending .   Placenta status: Intact, Spontaneous.  Cord: 3 vessels with the following complications: None.    Anesthesia: Epidural  Episiotomy: None Lacerations: 2nd degree;Perineal Suture Repair: vicryl rapide Est. Blood Loss (mL): 200  Mom to postpartum.  Baby to Couplet care / Skin to Skin.  Teresa Orozco is a 40 y.o. female [phone] with IUP at 7329w0d admitted for IOL for AMA age greater than 3840y .  She progressed with augmentation to complete and pushed less than 30 minutes to deliver.  Cord clamping delayed by several minutes then clamped by CNM and cut by FOB.  Placenta intact and spontaneous, bleeding minimal.  Small second degree laceration repaired without difficulty.  Mom and baby stable prior to transfer to postpartum. She plans on bottle feeding. She plans a Mirena for contraception.   LEFTWICH-KIRBY, Sherby Moncayo 07/26/2015, 7:03 PM

## 2015-01-18 ENCOUNTER — Telehealth: Payer: Self-pay | Admitting: General Practice

## 2015-01-18 LAB — HEMOGLOBINOPATHY EVALUATION
HGB A2 QUANT: 2.8 % (ref 2.2–3.2)
HGB F QUANT: 0 % (ref 0.0–2.0)
Hemoglobin Other: 0 %
Hgb A: 97.2 % (ref 96.8–97.8)
Hgb S Quant: 0 %

## 2015-01-18 NOTE — Telephone Encounter (Signed)
Patient called and left message in spanish requesting a call back about questions related to her urinary tract infection

## 2015-01-25 NOTE — Telephone Encounter (Signed)
Attempted to return patient's call using [employer] Spanish interpreter [phone]. Patient did not answer the phone, voice mail left stating we are trying to return her call and she should return our call at the clinics.

## 2015-01-27 ENCOUNTER — Other Ambulatory Visit: Payer: Self-pay | Admitting: Family Medicine

## 2015-01-27 ENCOUNTER — Encounter: Payer: Self-pay | Admitting: *Deleted

## 2015-01-27 ENCOUNTER — Encounter (HOSPITAL_COMMUNITY): Payer: Self-pay

## 2015-01-27 ENCOUNTER — Ambulatory Visit (HOSPITAL_COMMUNITY): Admission: RE | Admit: 2015-01-27 | Payer: Self-pay | Source: Ambulatory Visit

## 2015-01-27 ENCOUNTER — Ambulatory Visit (HOSPITAL_COMMUNITY)
Admission: RE | Admit: 2015-01-27 | Discharge: 2015-01-27 | Disposition: A | Discharge status: Home / Self Care | Payer: Self-pay | Source: Ambulatory Visit | Attending: Family Medicine | Admitting: Family Medicine

## 2015-01-27 VITALS — BP 122/74 | HR 99 | Wt 174.2 lb

## 2015-01-27 DIAGNOSIS — Z3689 Encounter for other specified antenatal screening: Secondary | ICD-10-CM

## 2015-01-27 DIAGNOSIS — O09522 Supervision of elderly multigravida, second trimester: Secondary | ICD-10-CM

## 2015-01-27 DIAGNOSIS — F1910 Other psychoactive substance abuse, uncomplicated: Secondary | ICD-10-CM

## 2015-01-27 DIAGNOSIS — Z3A14 14 weeks gestation of pregnancy: Secondary | ICD-10-CM | POA: Insufficient documentation

## 2015-01-27 DIAGNOSIS — O99321 Drug use complicating pregnancy, first trimester: Secondary | ICD-10-CM

## 2015-01-27 DIAGNOSIS — O0991 Supervision of high risk pregnancy, unspecified, first trimester: Secondary | ICD-10-CM

## 2015-01-27 DIAGNOSIS — Z36 Encounter for antenatal screening of mother: Secondary | ICD-10-CM | POA: Insufficient documentation

## 2015-01-27 NOTE — Telephone Encounter (Signed)
Attempted again to return patient's call. Used Safe[employer] CorporationPacific Spanish interpreter [phone]#[id number]. No answer, voice mail was left stating we were trying to return her call, please call the clinics. Patient's next appointment is 02/11/15. Will send letter. Letter sent.

## 2015-01-29 ENCOUNTER — Ambulatory Visit (HOSPITAL_COMMUNITY): Payer: Self-pay

## 2015-02-02 ENCOUNTER — Telehealth: Payer: Self-pay | Admitting: *Deleted

## 2015-02-02 NOTE — Telephone Encounter (Signed)
Pt left message in Spanish.

## 2015-02-03 ENCOUNTER — Encounter: Payer: Self-pay | Admitting: Certified Nurse Midwife

## 2015-02-03 ENCOUNTER — Ambulatory Visit (INDEPENDENT_AMBULATORY_CARE_PROVIDER_SITE_OTHER): Payer: Self-pay | Admitting: Certified Nurse Midwife

## 2015-02-03 VITALS — BP 118/70 | HR 83 | Temp 98.2°F | Wt 175.4 lb

## 2015-02-03 DIAGNOSIS — O23592 Infection of other part of genital tract in pregnancy, second trimester: Secondary | ICD-10-CM

## 2015-02-03 DIAGNOSIS — R399 Unspecified symptoms and signs involving the genitourinary system: Secondary | ICD-10-CM

## 2015-02-03 DIAGNOSIS — N760 Acute vaginitis: Secondary | ICD-10-CM

## 2015-02-03 DIAGNOSIS — N898 Other specified noninflammatory disorders of vagina: Secondary | ICD-10-CM

## 2015-02-03 DIAGNOSIS — O26891 Other specified pregnancy related conditions, first trimester: Secondary | ICD-10-CM

## 2015-02-03 LAB — POCT URINALYSIS DIP (DEVICE)
Bilirubin Urine: NEGATIVE
Glucose, UA: NEGATIVE mg/dL
Ketones, ur: NEGATIVE mg/dL
Nitrite: NEGATIVE
Protein, ur: NEGATIVE mg/dL
Specific Gravity, Urine: 1.02 (ref 1.005–1.030)
Urobilinogen, UA: 0.2 mg/dL (ref 0.0–1.0)
pH: 7 (ref 5.0–8.0)

## 2015-02-03 MED ORDER — FLUCONAZOLE 150 MG PO TABS: 150.0000 mg | ORAL_TABLET | Freq: Once | ORAL | Status: DC

## 2015-02-03 NOTE — Progress Notes (Signed)
Subjective:    Teresa Orozco is a 40 y.o. female being seen today for a work in obstetrical visit. She is at [redacted]w[redacted]d gestation. Patient reports vaginal irritation. Pt used monistat and had severe burning after applying.  Review of Systems:   Review of Systems  Constitutional: Negative for activity change.  Genitourinary: Positive for dysuria and hematuria.       Vaginal discharge  All other systems reviewed and are negative.    Objective:    BP 118/70 mmHg  Pulse 83  Temp(Src) 98.2 F (36.8 C) (Oral)  Wt 175 lb 6.4 oz (79.561 kg)  LMP 10/26/2014 (Exact Date)  Physical Exam  Nursing note and vitals reviewed. Constitutional: She appears well-developed and well-nourished. No distress.  HENT:  Head: Normocephalic and atraumatic.  Genitourinary: There is erythema in the vagina. Vaginal discharge found.    Maternal Exam:  Introitus: Vagina is positive for vaginal discharge.     FHT:  positive   Uterine Size:   Presentation:      Assessment:    Pregnancy:  [id number]  vaginitis   Plan:    Patient Active Problem List   Diagnosis Date Noted  . Vaginitis affecting pregnancy in second trimester, antepartum 02/03/2015  . UTI (urinary tract infection) in pregnancy, antepartum 01/16/2015  . Supervision of high risk pregnancy, antepartum 01/14/2015  . AMA (advanced maternal age) multigravida 35+ 01/14/2015  . Eustachian tube dysfunction 02/16/2012  . KERATOSIS PILARIS 06/15/2006   Diflucan  Follow up in next regular scheduled appointment.

## 2015-02-03 NOTE — Telephone Encounter (Signed)
Pt has been seen for this message

## 2015-02-04 LAB — WET PREP, GENITAL: Trich, Wet Prep: NONE SEEN

## 2015-02-05 LAB — CULTURE, OB URINE: Colony Count: 75000

## 2015-02-11 ENCOUNTER — Encounter: Payer: Self-pay | Admitting: Obstetrics and Gynecology

## 2015-02-11 ENCOUNTER — Ambulatory Visit (INDEPENDENT_AMBULATORY_CARE_PROVIDER_SITE_OTHER): Payer: Self-pay | Admitting: Obstetrics and Gynecology

## 2015-02-11 VITALS — BP 110/69 | HR 92 | Temp 99.1°F | Wt 172.5 lb

## 2015-02-11 DIAGNOSIS — O0992 Supervision of high risk pregnancy, unspecified, second trimester: Secondary | ICD-10-CM

## 2015-02-11 DIAGNOSIS — O09522 Supervision of elderly multigravida, second trimester: Secondary | ICD-10-CM

## 2015-02-11 LAB — POCT URINALYSIS DIP (DEVICE)
BILIRUBIN URINE: NEGATIVE
Glucose, UA: NEGATIVE mg/dL
Leukocytes, UA: NEGATIVE
Nitrite: NEGATIVE
PH: 7 (ref 5.0–8.0)
Protein, ur: NEGATIVE mg/dL
SPECIFIC GRAVITY, URINE: 1.015 (ref 1.005–1.030)
Urobilinogen, UA: 0.2 mg/dL (ref 0.0–1.0)

## 2015-02-11 MED ORDER — CEPHALEXIN 500 MG PO CAPS: 500.0000 mg | ORAL_CAPSULE | Freq: Four times a day (QID) | ORAL | Status: DC

## 2015-02-11 NOTE — Progress Notes (Signed)
Used Doctor, hospital . C/o feels " pins and needles : LLQ sometimes. "

## 2015-02-26 ENCOUNTER — Ambulatory Visit (HOSPITAL_COMMUNITY)
Admission: RE | Admit: 2015-02-26 | Discharge: 2015-02-26 | Disposition: A | Discharge status: Home / Self Care | Payer: Self-pay | Source: Ambulatory Visit | Attending: Family Medicine | Admitting: Family Medicine

## 2015-02-26 ENCOUNTER — Encounter (HOSPITAL_COMMUNITY): Payer: Self-pay

## 2015-02-26 ENCOUNTER — Other Ambulatory Visit (HOSPITAL_COMMUNITY): Payer: Self-pay | Admitting: Maternal and Fetal Medicine

## 2015-02-26 DIAGNOSIS — O09522 Supervision of elderly multigravida, second trimester: Secondary | ICD-10-CM

## 2015-02-26 DIAGNOSIS — Z3A18 18 weeks gestation of pregnancy: Secondary | ICD-10-CM

## 2015-02-26 DIAGNOSIS — Z3689 Encounter for other specified antenatal screening: Secondary | ICD-10-CM

## 2015-02-26 DIAGNOSIS — Z36 Encounter for antenatal screening of mother: Secondary | ICD-10-CM | POA: Insufficient documentation

## 2015-03-11 ENCOUNTER — Ambulatory Visit (INDEPENDENT_AMBULATORY_CARE_PROVIDER_SITE_OTHER): Payer: Self-pay | Admitting: Obstetrics & Gynecology

## 2015-03-11 VITALS — BP 121/67 | HR 93 | Temp 98.7°F | Wt 179.0 lb

## 2015-03-11 DIAGNOSIS — O4442 Low lying placenta NOS or without hemorrhage, second trimester: Secondary | ICD-10-CM

## 2015-03-11 DIAGNOSIS — O4440 Low lying placenta NOS or without hemorrhage, unspecified trimester: Secondary | ICD-10-CM

## 2015-03-11 DIAGNOSIS — O09522 Supervision of elderly multigravida, second trimester: Secondary | ICD-10-CM

## 2015-03-11 DIAGNOSIS — O0992 Supervision of high risk pregnancy, unspecified, second trimester: Secondary | ICD-10-CM

## 2015-03-11 LAB — POCT URINALYSIS DIP (DEVICE)
BILIRUBIN URINE: NEGATIVE
Glucose, UA: NEGATIVE mg/dL
Ketones, ur: NEGATIVE mg/dL
NITRITE: NEGATIVE
Protein, ur: NEGATIVE mg/dL
Specific Gravity, Urine: <=1.005 (ref 1.005–1.030)
Urobilinogen, UA: 0.2 mg/dL (ref 0.0–1.0)
pH: 6 (ref 5.0–8.0)

## 2015-03-11 NOTE — Patient Instructions (Signed)

## 2015-03-11 NOTE — Progress Notes (Signed)
Subjective:  Teresa Orozco is a 40 y.o. [id number] at [redacted]w[redacted]d being seen today for ongoing prenatal care.  [name] and has Eustachian tube dysfunction; Supervision of high risk pregnancy, antepartum; AMA (advanced maternal age) multigravida 35+; UTI (urinary tract infection) in pregnancy, antepartum; Vaginitis affecting pregnancy in second trimester, antepartum; and Low lying placenta, antepartum on her problem list.  Patient reports no complaints.  Contractions: Not present. Vag. Bleeding: None.  Movement: Present. Denies leaking of fluid.   The following portions of the patient's history were reviewed and updated as appropriate: allergies, current medications, past family history, past medical history, past social history, past surgical history and problem list. Problem list updated.  Objective:   Filed Vitals:   03/11/15 0917  BP: 121/67  Pulse: 93  Temp: 98.7 F (37.1 C)  Weight: 179 lb (81.194 kg)    Fetal Status: Fetal Heart Rate (bpm): 145   Movement: Present     General:  Alert, oriented and cooperative. Patient is in no acute distress.  Skin: Skin is warm and dry. No rash noted.   Cardiovascular: Normal heart rate noted  Respiratory: Normal respiratory effort, no problems with respiration noted  Abdomen: Soft, gravid, appropriate for gestational age. Pain/Pressure: Absent     Pelvic: Vag. Bleeding: None     Cervical exam deferred        Extremities: Normal range of motion.  Edema: Trace  Mental Status: Normal mood and affect. Normal behavior. Normal judgment and thought content.   Urinalysis: Urine Protein: Negative Urine Glucose: Negative  Assessment and Plan:  Pregnancy: [id number] at [redacted]w[redacted]d  1. Supervision of high risk pregnancy, antepartum, second trimester AMA  2. Low lying placenta, antepartum Repeat US in 2 weeks. Pelvic rest advised  Preterm labor symptoms and general obstetric precautions  including but not limited to vaginal bleeding, contractions, leaking of fluid and fetal movement were reviewed in detail with the patient. Please refer to After Visit Summary for other counseling recommendations.  Return in about 4 weeks (around 04/08/2015).   Adam Phenix, MD

## 2015-03-11 NOTE — Progress Notes (Signed)
Patient reports recent constipation- discussed dietary changes & colace Raquel used for interpreter Reviewed tip of week with patient Discussed flu shot with patient, will consider for next visit

## 2015-03-26 ENCOUNTER — Encounter (HOSPITAL_COMMUNITY): Payer: Self-pay

## 2015-03-26 ENCOUNTER — Ambulatory Visit (HOSPITAL_COMMUNITY)
Admission: RE | Admit: 2015-03-26 | Discharge: 2015-03-26 | Disposition: A | Discharge status: Home / Self Care | Payer: Self-pay | Source: Ambulatory Visit | Attending: Obstetrics & Gynecology | Admitting: Obstetrics & Gynecology

## 2015-03-26 VITALS — BP 116/63 | HR 95 | Wt 183.4 lb

## 2015-03-26 DIAGNOSIS — O09529 Supervision of elderly multigravida, unspecified trimester: Secondary | ICD-10-CM

## 2015-03-26 DIAGNOSIS — Z3A22 22 weeks gestation of pregnancy: Secondary | ICD-10-CM | POA: Insufficient documentation

## 2015-03-26 DIAGNOSIS — O09522 Supervision of elderly multigravida, second trimester: Secondary | ICD-10-CM | POA: Insufficient documentation

## 2015-03-26 DIAGNOSIS — O4442 Low lying placenta NOS or without hemorrhage, second trimester: Secondary | ICD-10-CM | POA: Insufficient documentation

## 2015-03-26 DIAGNOSIS — Z36 Encounter for antenatal screening of mother: Secondary | ICD-10-CM | POA: Insufficient documentation

## 2015-04-08 ENCOUNTER — Ambulatory Visit (INDEPENDENT_AMBULATORY_CARE_PROVIDER_SITE_OTHER): Payer: Self-pay | Admitting: Obstetrics & Gynecology

## 2015-04-08 VITALS — BP 113/51 | HR 90 | Temp 98.4°F | Wt 183.0 lb

## 2015-04-08 DIAGNOSIS — O0992 Supervision of high risk pregnancy, unspecified, second trimester: Secondary | ICD-10-CM

## 2015-04-08 DIAGNOSIS — O09522 Supervision of elderly multigravida, second trimester: Secondary | ICD-10-CM

## 2015-04-08 LAB — POCT URINALYSIS DIP (DEVICE)
BILIRUBIN URINE: NEGATIVE
Glucose, UA: NEGATIVE mg/dL
KETONES UR: NEGATIVE mg/dL
Nitrite: NEGATIVE
Protein, ur: NEGATIVE mg/dL
SPECIFIC GRAVITY, URINE: 1.02 (ref 1.005–1.030)
Urobilinogen, UA: 0.2 mg/dL (ref 0.0–1.0)
pH: 7 (ref 5.0–8.0)

## 2015-04-08 NOTE — Progress Notes (Signed)
Subjective:  Teresa Orozco is a 40 y.o. [id number] at 6330w3d being seen today for ongoing prenatal care.  [name] and has Eustachian tube dysfunction; Supervision of high risk pregnancy, antepartum; AMA (advanced maternal age) multigravida 35+; UTI (urinary tract infection) in pregnancy, antepartum; and Vaginitis affecting pregnancy in second trimester, antepartum on her problem list.  Patient reports no complaints.  Contractions: Not present. Vag. Bleeding: None.  Movement: Present. Denies leaking of fluid.   The following portions of the patient's history were reviewed and updated as appropriate: allergies, current medications, past family history, past medical history, past social history, past surgical history and problem list. Problem list updated.  Objective:   Filed Vitals:   04/08/15 1022  BP: 113/51  Pulse: 90  Temp: 98.4 F (36.9 C)  Weight: 183 lb (83.008 kg)    Fetal Status: Fetal Heart Rate (bpm): 145   Movement: Present     General:  Alert, oriented and cooperative. Patient is in no acute distress.  Skin: Skin is warm and dry. No rash noted.   Cardiovascular: Normal heart rate noted  Respiratory: Normal respiratory effort, no problems with respiration noted  Abdomen: Soft, gravid, appropriate for gestational age. Pain/Pressure: Present     Pelvic: Vag. Bleeding: None Vag D/C Character: White   Cervical exam deferred        Extremities: Normal range of motion.  Edema: Trace  Mental Status: Normal mood and affect. Normal behavior. Normal judgment and thought content.   Urinalysis:      Assessment and Plan:  Pregnancy: [id number] at 3230w3d  1. Supervision of high risk pregnancy, antepartum, second trimester AMA, needs growth US in 4 weeks  Preterm labor symptoms and general obstetric precautions including but not limited to vaginal bleeding, contractions, leaking of fluid and fetal movement were reviewed  in detail with the patient. Please refer to After Visit Summary for other counseling recommendations.  Return in about 4 weeks (around 05/06/2015).   Adam PhenixJames G Hisayo Delossantos, MD

## 2015-04-08 NOTE — Patient Instructions (Signed)

## 2015-05-06 ENCOUNTER — Ambulatory Visit (INDEPENDENT_AMBULATORY_CARE_PROVIDER_SITE_OTHER): Payer: Self-pay | Admitting: Family

## 2015-05-06 VITALS — BP 121/71 | HR 103 | Temp 98.3°F | Wt 183.2 lb

## 2015-05-06 DIAGNOSIS — Z30432 Encounter for removal of intrauterine contraceptive device: Secondary | ICD-10-CM

## 2015-05-06 DIAGNOSIS — O09529 Supervision of elderly multigravida, unspecified trimester: Secondary | ICD-10-CM

## 2015-05-06 DIAGNOSIS — O2343 Unspecified infection of urinary tract in pregnancy, third trimester: Secondary | ICD-10-CM

## 2015-05-06 DIAGNOSIS — K219 Gastro-esophageal reflux disease without esophagitis: Secondary | ICD-10-CM

## 2015-05-06 DIAGNOSIS — O09523 Supervision of elderly multigravida, third trimester: Secondary | ICD-10-CM

## 2015-05-06 DIAGNOSIS — O0993 Supervision of high risk pregnancy, unspecified, third trimester: Secondary | ICD-10-CM

## 2015-05-06 LAB — POCT URINALYSIS DIP (DEVICE)
BILIRUBIN URINE: NEGATIVE
Glucose, UA: NEGATIVE mg/dL
KETONES UR: NEGATIVE mg/dL
Nitrite: NEGATIVE
Protein, ur: 30 mg/dL — AB
Specific Gravity, Urine: 1.025 (ref 1.005–1.030)
Urobilinogen, UA: 1 mg/dL (ref 0.0–1.0)
pH: 7 (ref 5.0–8.0)

## 2015-05-06 LAB — CBC
HEMATOCRIT: 35.7 % (ref 35.0–45.0)
Hemoglobin: 11.7 g/dL (ref 11.7–15.5)
MCH: 30.2 pg (ref 27.0–33.0)
MCHC: 32.8 g/dL (ref 32.0–36.0)
MCV: 92 fL (ref 80.0–100.0)
MPV: 10 fL (ref 7.5–12.5)
PLATELETS: 318 10*3/uL (ref 140–400)
RBC: 3.88 MIL/uL (ref 3.80–5.10)
RDW: 14.7 % (ref 11.0–15.0)
WBC: 9.2 10*3/uL (ref 3.8–10.8)

## 2015-05-06 MED ORDER — PRENATAL VITAMINS 0.8 MG PO TABS: 1.0000 | ORAL_TABLET | Freq: Every day | ORAL | Status: DC

## 2015-05-06 MED ORDER — FAMOTIDINE 20 MG PO TABS: 20.0000 mg | ORAL_TABLET | Freq: Two times a day (BID) | ORAL | Status: DC

## 2015-05-06 NOTE — Progress Notes (Signed)
Subjective:  Teresa Orozco is a 40 y.o. [id number] at 6882w3d being seen today for ongoing prenatal care.  [name] and has Eustachian tube dysfunction; Supervision of high risk pregnancy, antepartum; AMA (advanced maternal age) multigravida 35+; UTI (urinary tract infection) in pregnancy, antepartum; and Vaginitis affecting pregnancy in second trimester, antepartum on her problem list.  Patient reports recent treatment for lice.   .  .  Movement: Present. Denies leaking of fluid.   The following portions of the patient's history were reviewed and updated as appropriate: allergies, current medications, past family history, past medical history, past social history, past surgical history and problem list. Problem list updated.  Objective:   Filed Vitals:   05/06/15 0950  BP: 121/71  Pulse: 103  Temp: 98.3 F (36.8 C)  Weight: 183 lb 3.2 oz (83.099 kg)    Fetal Status: Fetal Heart Rate (bpm): 132 Fundal Height: 29 cm Movement: Present     General:  Alert, oriented and cooperative. Patient is in no acute distress.  Skin: Skin is warm and dry. No rash noted.   Cardiovascular: Normal heart rate noted  Respiratory: Normal respiratory effort, no problems with respiration noted  Abdomen: Soft, gravid, appropriate for gestational age. Pain/Pressure: Absent     Pelvic:       Cervical exam deferred        Extremities: Normal range of motion.  Edema: Trace  Mental Status: Normal mood and affect. Normal behavior. Normal judgment and thought content.   Urinalysis: Urine Protein: 1+ Urine Glucose: Negative  Assessment and Plan:  Pregnancy: [id number] at 1382w3d  1. Supervision of high risk pregnancy, antepartum, third trimester - HIV antibody - CBC - RPR - Glucose Tolerance, 1 HR (50g)  2. AMA (advanced maternal age) multigravida 35+, unspecified trimester - Ultrasound scheduled for AM  3. UTI in pregnancy, antepartum, third  trimester - Culture, OB Urine TOC  4. Gastroesophageal reflux disease without esophagitis - famotidine (PEPCID) 20 MG tablet; Take 1 tablet (20 mg total) by mouth 2 (two) times daily.  Dispense: 30 tablet; Refill: 2   Preterm labor symptoms and general obstetric precautions including but not limited to vaginal bleeding, contractions, leaking of fluid and fetal movement were reviewed in detail with the patient. Please refer to After Visit Summary for other counseling recommendations.  Return in about 2 weeks (around 05/20/2015).   Eino FarberWalidah Kennith GainN Karim, CNM

## 2015-05-06 NOTE — Progress Notes (Signed)
Decline Flu at this time and TDAP she will think about it for later. Would like something called in for reflux

## 2015-05-07 ENCOUNTER — Other Ambulatory Visit (HOSPITAL_COMMUNITY): Payer: Self-pay | Admitting: Maternal and Fetal Medicine

## 2015-05-07 ENCOUNTER — Ambulatory Visit (HOSPITAL_COMMUNITY)
Admission: RE | Admit: 2015-05-07 | Discharge: 2015-05-07 | Disposition: A | Discharge status: Home / Self Care | Payer: Self-pay | Source: Ambulatory Visit | Attending: Maternal and Fetal Medicine | Admitting: Maternal and Fetal Medicine

## 2015-05-07 ENCOUNTER — Encounter (HOSPITAL_COMMUNITY): Payer: Self-pay

## 2015-05-07 DIAGNOSIS — O09523 Supervision of elderly multigravida, third trimester: Secondary | ICD-10-CM | POA: Insufficient documentation

## 2015-05-07 DIAGNOSIS — O09529 Supervision of elderly multigravida, unspecified trimester: Secondary | ICD-10-CM

## 2015-05-07 DIAGNOSIS — Z3A28 28 weeks gestation of pregnancy: Secondary | ICD-10-CM

## 2015-05-07 LAB — CULTURE, OB URINE

## 2015-05-07 LAB — RPR

## 2015-05-07 LAB — GLUCOSE TOLERANCE, 1 HOUR (50G) W/O FASTING: GLUCOSE, 1 HR, GESTATIONAL: 96 mg/dL (ref <140)

## 2015-05-07 LAB — HIV ANTIBODY (ROUTINE TESTING W REFLEX): HIV 1&2 Ab, 4th Generation: NONREACTIVE

## 2015-05-20 ENCOUNTER — Encounter: Payer: Self-pay | Admitting: Family Medicine

## 2015-05-26 ENCOUNTER — Emergency Department (HOSPITAL_COMMUNITY)
Admission: EM | Admit: 2015-05-26 | Discharge: 2015-05-26 | Disposition: A | Discharge status: Home / Self Care | Payer: Self-pay | Attending: Emergency Medicine | Admitting: Emergency Medicine

## 2015-05-26 ENCOUNTER — Encounter (HOSPITAL_COMMUNITY): Payer: Self-pay | Admitting: Emergency Medicine

## 2015-05-26 DIAGNOSIS — H6092 Unspecified otitis externa, left ear: Secondary | ICD-10-CM

## 2015-05-26 DIAGNOSIS — H608X9 Other otitis externa, unspecified ear: Secondary | ICD-10-CM | POA: Insufficient documentation

## 2015-05-26 DIAGNOSIS — Z79899 Other long term (current) drug therapy: Secondary | ICD-10-CM | POA: Insufficient documentation

## 2015-05-26 MED ORDER — CIPROFLOXACIN-DEXAMETHASONE 0.3-0.1 % OT SUSP
4.0000 [drp] | Freq: Two times a day (BID) | OTIC | Status: DC
Start: 2015-05-26 — End: 2015-05-26
  Administered 2015-05-26: 4 [drp] via OTIC
  Filled 2015-05-26: qty 7.5

## 2015-05-26 NOTE — ED Notes (Signed)
Declined W/C at D/C and was escorted to lobby by RN. 

## 2015-05-26 NOTE — ED Provider Notes (Signed)
History  By signing my name below, I, Karle Plumber, attest that this documentation has been prepared under the direction and in the presence of Melburn Hake, [place]. Electronically Signed: Karle Plumber, ED Scribe. 05/26/2015. 11:19 AM.  Chief Complaint  Patient presents with  . Otalgia   The history is provided by the patient and medical records. No language interpreter was used.    HPI Comments:  Teresa Orozco is a 40 y.o. female at 24 months gestation who presents to the Emergency Department complaining of left ear pain that began three days ago. She reports some muffled hearing from the left ear and reports yellow drainage that began yesterday. She has taken Tylenol for pain with some relief yesterday but no relief today (last dose about 7 hours ago). She denies modifying factors. She denies fever, chills, congestion, rhinorrhea, sore throat, inability to swallowing, cough. She denies any recent swimming, loud concerts or airplane travel.  History reviewed. No pertinent past medical history. Past Surgical History  Procedure Laterality Date  . Dilation and curettage of uterus  2008   No family history on file. Social History  Substance Use Topics  . Smoking status: Never Smoker   . Smokeless tobacco: Never Used  . Alcohol Use: No   OB History    Gravida Para Term Preterm AB TAB SAB Ectopic Multiple Living   0 4     Review of Systems  Constitutional: Negative for fever and chills.  HENT: Positive for ear discharge and ear pain. Negative for congestion, rhinorrhea, sore throat and trouble swallowing.   Respiratory: Negative for cough.     Allergies  Review of patient's allergies indicates no known allergies.  Home Medications   Prior to Admission medications   Medication Sig Start Date End Date Taking? Authorizing Provider  acetaminophen (TYLENOL) 500 MG tablet Take 1,000 mg by mouth every 6 (six) hours as needed for headache (pain). Reported on 05/06/2015     Historical Provider, MD  famotidine (PEPCID) 20 MG tablet Take 1 tablet (20 mg total) by mouth 2 (two) times daily. 05/06/15   Marlis Edelson, CNM  Prenatal Multivit-Min-Fe-FA (PRENATAL VITAMINS) 0.8 MG tablet Take 1 tablet by mouth daily. 05/06/15   Marlis Edelson, CNM   Triage Vitals: BP 120/66 mmHg  Pulse 90  Temp(Src) 98.6 F (37 C) (Oral)  Resp 18  SpO2 100%  LMP 10/26/2014 (Exact Date) Physical Exam  Constitutional: She is oriented to person, place, and time. She appears well-developed and well-nourished.  HENT:  Head: Normocephalic and atraumatic.  Right Ear: Hearing, tympanic membrane, external ear and ear canal normal. No mastoid tenderness.  Left Ear: Hearing normal. There is drainage (purulent) and swelling (mild). No tenderness. No mastoid tenderness.  Mouth/Throat: Uvula is midline, oropharynx is clear and moist and mucous membranes are normal. No oropharyngeal exudate, posterior oropharyngeal edema, posterior oropharyngeal erythema or tonsillar abscesses.  Eyes: EOM are normal.  Neck: Normal range of motion. Neck supple.  Cardiovascular: Normal rate.   Pulmonary/Chest: Effort normal.  Musculoskeletal: Normal range of motion.  Lymphadenopathy:    She has no cervical adenopathy.  Neurological: She is alert and oriented to person, place, and time.  Skin: Skin is warm and dry.  Psychiatric: She has a normal mood and affect. Her behavior is normal.  Nursing note and vitals reviewed.   ED Course  Procedures (including critical care time) DIAGNOSTIC STUDIES: Oxygen Saturation is 100% on RA, normal by my interpretation.  COORDINATION OF CARE: 10:47 AM- Will prescribe antibiotics. Pt verbalizes understanding and agrees to plan.  11:08 AM- Spoke with pharmacy and they said Ciprodex was appropriate to use in pregnancy with shortest course of treatment.  Medications  ciprofloxacin-dexamethasone (CIPRODEX) 0.3-0.1 % otic suspension 4 drop (not administered)    Labs  Review Labs Reviewed - No data to display  Imaging Review No results found. I have personally reviewed and evaluated these images and lab results as part of my medical decision-making.   EKG Interpretation None      MDM   Final diagnoses:  Otitis externa, left    Pt presenting with otitis externa. No canal occlusion. Pt afebrile in NAD. Exam non concerning for mastoiditis, cellulitis or malignant OE. Spoke with pharmacy and they said Ciprodex was appropriate to use in pregnancy with shortest course of treatment. Ciprodex drops placed in the left ear in the ED and pt given bottle to take home.  Advised pt to follow up in 2-3 days if no improvement with treatment or no complete resolution by 7 days. Strict return precautions given.   I personally performed the services described in this documentation, which was scribed in my presence. The recorded information has been reviewed and is accurate.     Satira Sarkicole Elizabeth MuscodaNadeau, [place]-C 05/26/15 1121  Zadie Rhineonald Wickline, MD 05/26/15 1139

## 2015-05-26 NOTE — Discharge Instructions (Signed)
Apply 4 drops of antibiotics to her left ear twice daily for the next 7 days. Tilt head towards her opposite shoulder, pole your ear upward and filled her canal with drops. Laying on her side for 3-5 minutes or place a cotton ball in the ear for 20 minutes to maximize medicine exposure. You may take Tylenol and/or ibuprofen as prescribed over-the-counter as needed for pain relief. Please follow up with a primary care provider from the Resource Guide provided below in 1 week for follow up. Please return to the Emergency Department if symptoms worsen or new onset of fever, headache, neck stiffness, facial/ear swelling, also tearing, tenderness/swelling to the posterior ear.

## 2015-05-26 NOTE — ED Notes (Signed)
Pt reports left ear pain x 3days. Pt alert x4. NAD at this time.

## 2015-05-27 ENCOUNTER — Ambulatory Visit (INDEPENDENT_AMBULATORY_CARE_PROVIDER_SITE_OTHER): Payer: Self-pay | Admitting: Family Medicine

## 2015-05-27 VITALS — BP 118/64 | HR 98 | Wt 184.0 lb

## 2015-05-27 DIAGNOSIS — O09523 Supervision of elderly multigravida, third trimester: Secondary | ICD-10-CM

## 2015-05-27 DIAGNOSIS — O2343 Unspecified infection of urinary tract in pregnancy, third trimester: Secondary | ICD-10-CM

## 2015-05-27 DIAGNOSIS — O0993 Supervision of high risk pregnancy, unspecified, third trimester: Secondary | ICD-10-CM

## 2015-05-27 LAB — POCT URINALYSIS DIP (DEVICE)
BILIRUBIN URINE: NEGATIVE
Glucose, UA: NEGATIVE mg/dL
Hgb urine dipstick: NEGATIVE
KETONES UR: NEGATIVE mg/dL
NITRITE: NEGATIVE
Protein, ur: NEGATIVE mg/dL
Specific Gravity, Urine: 1.02 (ref 1.005–1.030)
Urobilinogen, UA: 0.2 mg/dL (ref 0.0–1.0)
pH: 7 (ref 5.0–8.0)

## 2015-05-27 NOTE — Progress Notes (Signed)
Marley used for interpreter; patient reports feeling fatigued and shortness of breath Tip of week discussed with patient

## 2015-05-27 NOTE — Progress Notes (Signed)
Subjective:  Teresa Orozco is a 40 y.o. [id number] at 358w3d being seen today for ongoing prenatal care.  [name] and has Eustachian tube dysfunction; Supervision of high risk pregnancy, antepartum; AMA (advanced maternal age) multigravida 35+; UTI (urinary tract infection) in pregnancy, antepartum; and Vaginitis affecting pregnancy in second trimester, antepartum on her problem list.  Patient reports left ear pain.  Was seen in ED yesterady - otitis externa.  Given cipro drops.  Helping a little.  Contractions: Not present.  .  Movement: Present. Denies leaking of fluid.   The following portions of the patient's history were reviewed and updated as appropriate: allergies, current medications, past family history, past medical history, past social history, past surgical history and problem list. Problem list updated.  Objective:   Filed Vitals:   05/27/15 0922  BP: 118/64  Pulse: 98  Weight: 184 lb (83.462 kg)    Fetal Status: Fetal Heart Rate (bpm): 131   Movement: Present     General:  Alert, oriented and cooperative. Patient is in no acute distress.  HEENT  Left otitis externa  Skin: Skin is warm and dry. No rash noted.   Cardiovascular: Normal heart rate noted  Respiratory: Normal respiratory effort, no problems with respiration noted  Abdomen: Soft, gravid, appropriate for gestational age. Pain/Pressure: Absent     Pelvic:       Cervical exam deferred        Extremities: Normal range of motion.  Edema: Trace  Mental Status: Normal mood and affect. Normal behavior. Normal judgment and thought content.   Urinalysis:      Assessment and Plan:  Pregnancy: [id number] at 868w3d  1. Supervision of high risk pregnancy, antepartum, third trimester FHT and FH normal  2. AMA (advanced maternal age) multigravida 35+, third trimester NST at 36 weeks  3.  Otitis externa Continue cipro drops  Preterm labor symptoms and  general obstetric precautions including but not limited to vaginal bleeding, contractions, leaking of fluid and fetal movement were reviewed in detail with the patient. Please refer to After Visit Summary for other counseling recommendations.  No Follow-up on file.   Levie HeritageJacob J Brycelynn Stampley, DO

## 2015-06-10 ENCOUNTER — Ambulatory Visit (INDEPENDENT_AMBULATORY_CARE_PROVIDER_SITE_OTHER): Payer: Self-pay | Admitting: Family Medicine

## 2015-06-10 VITALS — BP 113/72 | HR 107 | Temp 98.6°F | Wt 185.6 lb

## 2015-06-10 DIAGNOSIS — O09523 Supervision of elderly multigravida, third trimester: Secondary | ICD-10-CM

## 2015-06-10 DIAGNOSIS — O0993 Supervision of high risk pregnancy, unspecified, third trimester: Secondary | ICD-10-CM

## 2015-06-10 LAB — POCT URINALYSIS DIP (DEVICE)
Bilirubin Urine: NEGATIVE
GLUCOSE, UA: NEGATIVE mg/dL
Nitrite: NEGATIVE
PH: 6.5 (ref 5.0–8.0)
PROTEIN: 30 mg/dL — AB
SPECIFIC GRAVITY, URINE: 1.02 (ref 1.005–1.030)
UROBILINOGEN UA: 1 mg/dL (ref 0.0–1.0)

## 2015-06-10 NOTE — Progress Notes (Signed)
Large leukocytes noted on urinalysis. Used Interpreter [employer].

## 2015-06-10 NOTE — Progress Notes (Signed)
Subjective:  Teresa Orozco is a 40 y.o. [id number] at 766w3d being seen today for ongoing prenatal care.  [name] and has Eustachian tube dysfunction; Supervision of high risk pregnancy, antepartum; AMA (advanced maternal age) multigravida 35+; UTI (urinary tract infection) in pregnancy, antepartum; and Vaginitis affecting pregnancy in second trimester, antepartum on her problem list.  Patient reports no complaints.  Ear pain improved. Contractions: Not present. Vag. Bleeding: None.  Movement: Present. Denies leaking of fluid.   The following portions of the patient's history were reviewed and updated as appropriate: allergies, current medications, past family history, past medical history, past social history, past surgical history and problem list. Problem list updated.  Objective:   Filed Vitals:   06/10/15 1103  BP: 113/72  Pulse: 107  Temp: 98.6 F (37 C)  Weight: 185 lb 9.6 oz (84.188 kg)    Fetal Status: Fetal Heart Rate (bpm): 152 Fundal Height: 35 cm Movement: Present     General:  Alert, oriented and cooperative. Patient is in no acute distress.  Skin: Skin is warm and dry. No rash noted.   Cardiovascular: Normal heart rate noted  Respiratory: Normal respiratory effort, no problems with respiration noted  Abdomen: Soft, gravid, appropriate for gestational age. Pain/Pressure: Present     Pelvic: Vag. Bleeding: None     Cervical exam deferred        Extremities: Normal range of motion.  Edema: Trace  Mental Status: Normal mood and affect. Normal behavior. Normal judgment and thought content.   Urinalysis: Urine Protein: 1+ Urine Glucose: Negative  Assessment and Plan:  Pregnancy: [id number] at 8166w3d  1. Supervision of high risk pregnancy, antepartum, third trimester FHT and FH normal - continue to monitor FH closely.  May need repeat US if greater than dates.  2. AMA (advanced maternal age) multigravida 35+,  third trimester Patient declines further US due to cost.  Discussed NST around 36 weeks.  Preterm labor symptoms and general obstetric precautions including but not limited to vaginal bleeding, contractions, leaking of fluid and fetal movement were reviewed in detail with the patient. Please refer to After Visit Summary for other counseling recommendations.  Return in about 2 weeks (around 06/24/2015) for HR OB f/u.   Teresa HeritageJacob J Sheccid Lahmann, DO

## 2015-07-01 ENCOUNTER — Ambulatory Visit (INDEPENDENT_AMBULATORY_CARE_PROVIDER_SITE_OTHER): Payer: Self-pay | Admitting: Family Medicine

## 2015-07-01 ENCOUNTER — Encounter: Payer: Self-pay | Admitting: Family Medicine

## 2015-07-01 VITALS — BP 111/66 | HR 88 | Wt 190.7 lb

## 2015-07-01 DIAGNOSIS — O09523 Supervision of elderly multigravida, third trimester: Secondary | ICD-10-CM

## 2015-07-01 DIAGNOSIS — O0993 Supervision of high risk pregnancy, unspecified, third trimester: Secondary | ICD-10-CM

## 2015-07-01 LAB — POCT URINALYSIS DIP (DEVICE)
BILIRUBIN URINE: NEGATIVE
GLUCOSE, UA: NEGATIVE mg/dL
Hgb urine dipstick: NEGATIVE
KETONES UR: NEGATIVE mg/dL
Nitrite: NEGATIVE
PROTEIN: 30 mg/dL — AB
SPECIFIC GRAVITY, URINE: 1.02 (ref 1.005–1.030)
Urobilinogen, UA: 1 mg/dL (ref 0.0–1.0)
pH: 7 (ref 5.0–8.0)

## 2015-07-01 NOTE — Patient Instructions (Signed)
Third Trimester of Pregnancy The third trimester is from week 29 through week 42, months 7 through 9. The third trimester is a time when the fetus is growing rapidly. At the end of the ninth month, the fetus is about 20 inches in length and weighs 6-10 pounds.  BODY CHANGES Your body goes through many changes during pregnancy. The changes vary from woman to woman.   Your weight will continue to increase. You can expect to gain 25-35 pounds (11-16 kg) by the end of the pregnancy.  You may begin to get stretch marks on your hips, abdomen, and breasts.  You may urinate more often because the fetus is moving lower into your pelvis and pressing on your bladder.  You may develop or continue to have heartburn as a result of your pregnancy.  You may develop constipation because certain hormones are causing the muscles that push waste through your intestines to slow down.  You may develop hemorrhoids or swollen, bulging veins (varicose veins).  You may have pelvic pain because of the weight gain and pregnancy hormones relaxing your joints between the bones in your pelvis. Backaches may result from overexertion of the muscles supporting your posture.  You may have changes in your hair. These can include thickening of your hair, rapid growth, and changes in texture. Some women also have hair loss during or after pregnancy, or hair that feels dry or thin. Your hair will most likely return to normal after your baby is born.  Your breasts will continue to grow and be tender. A yellow discharge may leak from your breasts called colostrum.  Your belly button may stick out.  You may feel short of breath because of your expanding uterus.  You may notice the fetus "dropping," or moving lower in your abdomen.  You may have a bloody mucus discharge. This usually occurs a few days to a week before labor begins.  Your cervix becomes thin and soft (effaced) near your due date. WHAT TO EXPECT AT YOUR  PRENATAL EXAMS  You will have prenatal exams every 2 weeks until week 36. Then, you will have weekly prenatal exams. During a routine prenatal visit:  You will be weighed to make sure you and the fetus are growing normally.  Your blood pressure is taken.  Your abdomen will be measured to track your baby's growth.  The fetal heartbeat will be listened to.  Any test results from the previous visit will be discussed.  You may have a cervical check near your due date to see if you have effaced. At around 36 weeks, your caregiver will check your cervix. At the same time, your caregiver will also perform a test on the secretions of the vaginal tissue. This test is to determine if a type of bacteria, Group B streptococcus, is present. Your caregiver will explain this further. Your caregiver may ask you:  What your birth plan is.  How you are feeling.  If you are feeling the baby move.  If you have had any abnormal symptoms, such as leaking fluid, bleeding, severe headaches, or abdominal cramping.  If you are using any tobacco products, including cigarettes, chewing tobacco, and electronic cigarettes.  If you have any questions. Other tests or screenings that may be performed during your third trimester include:  Blood tests that check for low iron levels (anemia).  Fetal testing to check the health, activity level, and growth of the fetus. Testing is done if you have certain medical conditions or if   there are problems during the pregnancy.  HIV (human immunodeficiency virus) testing. If you are at high risk, you may be screened for HIV during your third trimester of pregnancy. FALSE LABOR You may feel small, irregular contractions that eventually go away. These are called Braxton Hicks contractions, or false labor. Contractions may last for hours, days, or even weeks before true labor sets in. If contractions come at regular intervals, intensify, or become painful, it is best to be seen  by your caregiver.  SIGNS OF LABOR   Menstrual-like cramps.  Contractions that are 5 minutes apart or less.  Contractions that start on the top of the uterus and spread down to the lower abdomen and back.  A sense of increased pelvic pressure or back pain.  A watery or bloody mucus discharge that comes from the vagina. If you have any of these signs before the 37th week of pregnancy, call your caregiver right away. You need to go to the hospital to get checked immediately. HOME CARE INSTRUCTIONS   Avoid all smoking, herbs, alcohol, and unprescribed drugs. These chemicals affect the formation and growth of the baby.  Do not use any tobacco products, including cigarettes, chewing tobacco, and electronic cigarettes. If you need help quitting, ask your health care provider. You may receive counseling support and other resources to help you quit.  Follow your caregiver's instructions regarding medicine use. There are medicines that are either safe or unsafe to take during pregnancy.  Exercise only as directed by your caregiver. Experiencing uterine cramps is a good sign to stop exercising.  Continue to eat regular, healthy meals.  Wear a good support bra for breast tenderness.  Do not use hot tubs, steam rooms, or saunas.  Wear your seat belt at all times when driving.  Avoid raw meat, uncooked cheese, cat litter boxes, and soil used by cats. These carry germs that can cause birth defects in the baby.  Take your prenatal vitamins.  Take 1500-2000 mg of calcium daily starting at the 20th week of pregnancy until you deliver your baby.  Try taking a stool softener (if your caregiver approves) if you develop constipation. Eat more high-fiber foods, such as fresh vegetables or fruit and whole grains. Drink plenty of fluids to keep your urine clear or pale yellow.  Take warm sitz baths to soothe any pain or discomfort caused by hemorrhoids. Use hemorrhoid cream if your caregiver  approves.  If you develop varicose veins, wear support hose. Elevate your feet for 15 minutes, 3-4 times a day. Limit salt in your diet.  Avoid heavy lifting, wear low heal shoes, and practice good posture.  Rest a lot with your legs elevated if you have leg cramps or low back pain.  Visit your dentist if you have not gone during your pregnancy. Use a soft toothbrush to brush your teeth and be gentle when you floss.  A sexual relationship may be continued unless your caregiver directs you otherwise.  Do not travel far distances unless it is absolutely necessary and only with the approval of your caregiver.  Take prenatal classes to understand, practice, and ask questions about the labor and delivery.  Make a trial run to the hospital.  Pack your hospital bag.  Prepare the baby's nursery.  Continue to go to all your prenatal visits as directed by your caregiver. SEEK MEDICAL CARE IF:  You are unsure if you are in labor or if your water has broken.  You have dizziness.  You have   mild pelvic cramps, pelvic pressure, or nagging pain in your abdominal area.  You have persistent nausea, vomiting, or diarrhea.  You have a bad smelling vaginal discharge.  You have pain with urination. SEEK IMMEDIATE MEDICAL CARE IF:   You have a fever.  You are leaking fluid from your vagina.  You have spotting or bleeding from your vagina.  You have severe abdominal cramping or pain.  You have rapid weight loss or gain.  You have shortness of breath with chest pain.  You notice sudden or extreme swelling of your face, hands, ankles, feet, or legs.  You have not felt your baby move in over an hour.  You have severe headaches that do not go away with medicine.  You have vision changes.   This information is not intended to replace advice given to you by your health care provider. Make sure you discuss any questions you have with your health care provider.   Document Released:  12/27/2000 Document Revised: 01/23/2014 Document Reviewed: 03/05/2012 Elsevier Interactive Patient Education 2016 [employer]  Breastfeeding Deciding to breastfeed is one of the best choices you can make for you and your baby. A change in hormones during pregnancy causes your breast tissue to grow and increases the number and size of your milk ducts. These hormones also allow proteins, sugars, and fats from your blood supply to make breast milk in your milk-producing glands. Hormones prevent breast milk from being released before your baby is born as well as prompt milk flow after birth. Once breastfeeding has begun, thoughts of your baby, as well as his or her sucking or crying, can stimulate the release of milk from your milk-producing glands.  BENEFITS OF BREASTFEEDING For Your Baby  Your first milk (colostrum) helps your baby's digestive system function better.  There are antibodies in your milk that help your baby fight off infections.  Your baby has a lower incidence of asthma, allergies, and sudden infant death syndrome.  The nutrients in breast milk are better for your baby than infant formulas and are designed uniquely for your baby's needs.  Breast milk improves your baby's brain development.  Your baby is less likely to develop other conditions, such as childhood obesity, asthma, or type 2 diabetes mellitus. For You  Breastfeeding helps to create a very special bond between you and your baby.  Breastfeeding is convenient. Breast milk is always available at the correct temperature and costs nothing.  Breastfeeding helps to burn calories and helps you lose the weight gained during pregnancy.  Breastfeeding makes your uterus contract to its prepregnancy size faster and slows bleeding (lochia) after you give birth.   Breastfeeding helps to lower your risk of developing type 2 diabetes mellitus, osteoporosis, and breast or ovarian cancer later in life. SIGNS THAT YOUR BABY IS  HUNGRY Early Signs of Hunger  Increased alertness or activity.  Stretching.  Movement of the head from side to side.  Movement of the head and opening of the mouth when the corner of the mouth or cheek is stroked (rooting).  Increased sucking sounds, smacking lips, cooing, sighing, or squeaking.  Hand-to-mouth movements.  Increased sucking of fingers or hands. Late Signs of Hunger  Fussing.  Intermittent crying. Extreme Signs of Hunger Signs of extreme hunger will require calming and consoling before your baby will be able to breastfeed successfully. Do not wait for the following signs of extreme hunger to occur before you initiate breastfeeding:  Restlessness.  A loud, strong cry.  Screaming.   BREASTFEEDING BASICS Breastfeeding Initiation  Find a comfortable place to sit or lie down, with your neck and back well supported.  Place a pillow or rolled up blanket under your baby to bring him or her to the level of your breast (if you are seated). Nursing pillows are specially designed to help support your arms and your baby while you breastfeed.  Make sure that your baby's abdomen is facing your abdomen.  Gently massage your breast. With your fingertips, massage from your chest wall toward your nipple in a circular motion. This encourages milk flow. You may need to continue this action during the feeding if your milk flows slowly.  Support your breast with 4 fingers underneath and your thumb above your nipple. Make sure your fingers are well away from your nipple and your baby's mouth.  Stroke your baby's lips gently with your finger or nipple.  When your baby's mouth is open wide enough, quickly bring your baby to your breast, placing your entire nipple and as much of the colored area around your nipple (areola) as possible into your baby's mouth.  More areola should be visible above your baby's upper lip than below the lower lip.  Your baby's tongue should be between his  or her lower gum and your breast.  Ensure that your baby's mouth is correctly positioned around your nipple (latched). Your baby's lips should create a seal on your breast and be turned out (everted).  It is common for your baby to suck about 2-3 minutes in order to start the flow of breast milk. Latching Teaching your baby how to latch on to your breast properly is very important. An improper latch can cause nipple pain and decreased milk supply for you and poor weight gain in your baby. Also, if your baby is not latched onto your nipple properly, he or she may swallow some air during feeding. This can make your baby fussy. Burping your baby when you switch breasts during the feeding can help to get rid of the air. However, teaching your baby to latch on properly is still the best way to prevent fussiness from swallowing air while breastfeeding. Signs that your baby has successfully latched on to your nipple:  Silent tugging or silent sucking, without causing you pain.  Swallowing heard between every 3-4 sucks.  Muscle movement above and in front of his or her ears while sucking. Signs that your baby has not successfully latched on to nipple:  Sucking sounds or smacking sounds from your baby while breastfeeding.  Nipple pain. If you think your baby has not latched on correctly, slip your finger into the corner of your baby's mouth to break the suction and place it between your baby's gums. Attempt breastfeeding initiation again. Signs of Successful Breastfeeding Signs from your baby:  A gradual decrease in the number of sucks or complete cessation of sucking.  Falling asleep.  Relaxation of his or her body.  Retention of a small amount of milk in his or her mouth.  Letting go of your breast by himself or herself. Signs from you:  Breasts that have increased in firmness, weight, and size 1-3 hours after feeding.  Breasts that are softer immediately after  breastfeeding.  Increased milk volume, as well as a change in milk consistency and color by the fifth day of breastfeeding.  Nipples that are not sore, cracked, or bleeding. Signs That Your Baby is Getting Enough Milk  Wetting at least 3 diapers in a 24-hour period.   The urine should be clear and pale yellow by age 5 days.  At least 3 stools in a 24-hour period by age 5 days. The stool should be soft and yellow.  At least 3 stools in a 24-hour period by age 7 days. The stool should be seedy and yellow.  No loss of weight greater than 10% of birth weight during the first 3 days of age.  Average weight gain of 4-7 ounces (113-198 g) per week after age 4 days.  Consistent daily weight gain by age 5 days, without weight loss after the age of 2 weeks. After a feeding, your baby may spit up a small amount. This is common. BREASTFEEDING FREQUENCY AND DURATION Frequent feeding will help you make more milk and can prevent sore nipples and breast engorgement. Breastfeed when you feel the need to reduce the fullness of your breasts or when your baby shows signs of hunger. This is called "breastfeeding on demand." Avoid introducing a pacifier to your baby while you are working to establish breastfeeding (the first 4-6 weeks after your baby is born). After this time you may choose to use a pacifier. Research has shown that pacifier use during the first year of a baby's life decreases the risk of sudden infant death syndrome (SIDS). Allow your baby to feed on each breast as long as he or she wants. Breastfeed until your baby is finished feeding. When your baby unlatches or falls asleep while feeding from the first breast, offer the second breast. Because newborns are often sleepy in the first few weeks of life, you may need to awaken your baby to get him or her to feed. Breastfeeding times will vary from baby to baby. However, the following rules can serve as a guide to help you ensure that your baby is  properly fed:  Newborns (babies 4 weeks of age or younger) may breastfeed every 1-3 hours.  Newborns should not go longer than 3 hours during the day or 5 hours during the night without breastfeeding.  You should breastfeed your baby a minimum of 8 times in a 24-hour period until you begin to introduce solid foods to your baby at around 6 months of age. BREAST MILK PUMPING Pumping and storing breast milk allows you to ensure that your baby is exclusively fed your breast milk, even at times when you are unable to breastfeed. This is especially important if you are going back to work while you are still breastfeeding or when you are not able to be present during feedings. Your lactation consultant can give you guidelines on how long it is safe to store breast milk. A breast pump is a machine that allows you to pump milk from your breast into a sterile bottle. The pumped breast milk can then be stored in a refrigerator or freezer. Some breast pumps are operated by hand, while others use electricity. Ask your lactation consultant which type will work best for you. Breast pumps can be purchased, but some hospitals and breastfeeding support groups lease breast pumps on a monthly basis. A lactation consultant can teach you how to hand express breast milk, if you prefer not to use a pump. CARING FOR YOUR BREASTS WHILE YOU BREASTFEED Nipples can become dry, cracked, and sore while breastfeeding. The following recommendations can help keep your breasts moisturized and healthy:  Avoid using soap on your nipples.  Wear a supportive bra. Although not required, special nursing bras and tank tops are designed to allow access to your   breasts for breastfeeding without taking off your entire bra or top. Avoid wearing underwire-style bras or extremely tight bras.  Air dry your nipples for 3-4minutes after each feeding.  Use only cotton bra pads to absorb leaked breast milk. Leaking of breast milk between feedings  is normal.  Use lanolin on your nipples after breastfeeding. Lanolin helps to maintain your skin's normal moisture barrier. If you use pure lanolin, you do not need to wash it off before feeding your baby again. Pure lanolin is not toxic to your baby. You may also hand express a few drops of breast milk and gently massage that milk into your nipples and allow the milk to air dry. In the first few weeks after giving birth, some women experience extremely full breasts (engorgement). Engorgement can make your breasts feel heavy, warm, and tender to the touch. Engorgement peaks within 3-5 days after you give birth. The following recommendations can help ease engorgement:  Completely empty your breasts while breastfeeding or pumping. You may want to start by applying warm, moist heat (in the shower or with warm water-soaked hand towels) just before feeding or pumping. This increases circulation and helps the milk flow. If your baby does not completely empty your breasts while breastfeeding, pump any extra milk after he or she is finished.  Wear a snug bra (nursing or regular) or tank top for 1-2 days to signal your body to slightly decrease milk production.  Apply ice packs to your breasts, unless this is too uncomfortable for you.  Make sure that your baby is latched on and positioned properly while breastfeeding. If engorgement persists after 48 hours of following these recommendations, contact your health care provider or a lactation consultant. OVERALL HEALTH CARE RECOMMENDATIONS WHILE BREASTFEEDING  Eat healthy foods. Alternate between meals and snacks, eating 3 of each per day. Because what you eat affects your breast milk, some of the foods may make your baby more irritable than usual. Avoid eating these foods if you are sure that they are negatively affecting your baby.  Drink milk, fruit juice, and water to satisfy your thirst (about 10 glasses a day).  Rest often, relax, and continue to take  your prenatal vitamins to prevent fatigue, stress, and anemia.  Continue breast self-awareness checks.  Avoid chewing and smoking tobacco. Chemicals from cigarettes that pass into breast milk and exposure to secondhand smoke may harm your baby.  Avoid alcohol and drug use, including marijuana. Some medicines that may be harmful to your baby can pass through breast milk. It is important to ask your health care provider before taking any medicine, including all over-the-counter and prescription medicine as well as vitamin and herbal supplements. It is possible to become pregnant while breastfeeding. If birth control is desired, ask your health care provider about options that will be safe for your baby. SEEK MEDICAL CARE IF:  You feel like you want to stop breastfeeding or have become frustrated with breastfeeding.  You have painful breasts or nipples.  Your nipples are cracked or bleeding.  Your breasts are red, tender, or warm.  You have a swollen area on either breast.  You have a fever or chills.  You have nausea or vomiting.  You have drainage other than breast milk from your nipples.  Your breasts do not become full before feedings by the fifth day after you give birth.  You feel sad and depressed.  Your baby is too sleepy to eat well.  Your baby is having trouble sleeping.     Your baby is wetting less than 3 diapers in a 24-hour period.  Your baby has less than 3 stools in a 24-hour period.  Your baby's skin or the white part of his or her eyes becomes yellow.   Your baby is not gaining weight by 5 days of age. SEEK IMMEDIATE MEDICAL CARE IF:  Your baby is overly tired (lethargic) and does not want to wake up and feed.  Your baby develops an unexplained fever.   This information is not intended to replace advice given to you by your health care provider. Make sure you discuss any questions you have with your health care provider.   Document Released: 01/02/2005  Document Revised: 09/23/2014 Document Reviewed: 06/26/2012 Elsevier Interactive Patient Education 2016 [employer] Inc[employer]  

## 2015-07-01 NOTE — Progress Notes (Signed)
Pt said she could not wait for NST today and she left clinic.

## 2015-07-01 NOTE — Progress Notes (Signed)
Subjective:  Teresa Orozco is a 40 y.o. [id number] at 4463w3d being seen today for ongoing prenatal care.  [name] and has Eustachian tube dysfunction; Supervision of high risk pregnancy, antepartum; AMA (advanced maternal age) multigravida 35+; and UTI (urinary tract infection) in pregnancy, antepartum on her problem list.  Patient reports no complaints.  Contractions: Not present. Vag. Bleeding: None.  Movement: Present. Denies leaking of fluid.   The following portions of the patient's history were reviewed and updated as appropriate: allergies, current medications, past family history, past medical history, past social history, past surgical history and problem list. Problem list updated.  Objective:   Filed Vitals:   07/01/15 1043  BP: 111/66  Pulse: 88  Weight: 190 lb 11.2 oz (86.501 kg)    Fetal Status: Fetal Heart Rate (bpm): 154 Fundal Height: 38 cm Movement: Present  Presentation: Vertex  General:  Alert, oriented and cooperative. Patient is in no acute distress.  Skin: Skin is warm and dry. No rash noted.   Cardiovascular: Normal heart rate noted  Respiratory: Normal respiratory effort, no problems with respiration noted  Abdomen: Soft, gravid, appropriate for gestational age. Pain/Pressure: Present     Pelvic: Cervical exam deferred Dilation: 1.5 Effacement (%): 50 Station: -2  Extremities: Normal range of motion.  Edema: None  Mental Status: Normal mood and affect. Normal behavior. Normal judgment and thought content.   Urinalysis: Urine Protein: 1+ Urine Glucose: Negative  Assessment and Plan:  Pregnancy: [id number] at 6663w3d  1. Supervision of high risk pregnancy, antepartum, third trimester Continue  prenatal care. S>D - previous 9 lb baby  - Culture, beta strep (group b only) - [facility]Chlamydia [facility]  2. AMA (advanced maternal age) multigravida 35+, third trimester Needs to  begin 2x/wk testing due to age > 3540.  Declines further u/s  Term labor symptoms and general obstetric precautions including but not limited to vaginal bleeding, contractions, leaking of fluid and fetal movement were reviewed in detail with the patient. Please refer to After Visit Summary for other counseling recommendations.  Return in 1 week (on 07/08/2015).   Reva Boresanya S Nyrah Demos, MD

## 2015-07-01 NOTE — Progress Notes (Signed)
Cultures today 

## 2015-07-02 LAB — GC/CHLAMYDIA PROBE AMP (~~LOC~~) NOT AT ARMC
CHLAMYDIA, DNA PROBE: NEGATIVE
NEISSERIA GONORRHEA: NEGATIVE

## 2015-07-03 LAB — CULTURE, BETA STREP (GROUP B ONLY)

## 2015-07-03 LAB — OB RESULTS CONSOLE GBS: STREP GROUP B AG: NEGATIVE

## 2015-07-05 ENCOUNTER — Other Ambulatory Visit: Payer: Self-pay

## 2015-07-09 ENCOUNTER — Ambulatory Visit (INDEPENDENT_AMBULATORY_CARE_PROVIDER_SITE_OTHER): Payer: Self-pay | Admitting: Obstetrics & Gynecology

## 2015-07-09 VITALS — BP 118/69 | HR 94 | Wt 194.0 lb

## 2015-07-09 DIAGNOSIS — O0993 Supervision of high risk pregnancy, unspecified, third trimester: Secondary | ICD-10-CM

## 2015-07-09 DIAGNOSIS — O09523 Supervision of elderly multigravida, third trimester: Secondary | ICD-10-CM

## 2015-07-09 LAB — POCT URINALYSIS DIP (DEVICE)
Bilirubin Urine: NEGATIVE
GLUCOSE, UA: NEGATIVE mg/dL
Hgb urine dipstick: NEGATIVE
Ketones, ur: NEGATIVE mg/dL
Nitrite: NEGATIVE
PH: 7 (ref 5.0–8.0)
PROTEIN: NEGATIVE mg/dL
Specific Gravity, Urine: 1.02 (ref 1.005–1.030)
UROBILINOGEN UA: 0.2 mg/dL (ref 0.0–1.0)

## 2015-07-09 NOTE — Progress Notes (Signed)
[employer] [phone]#[id number]

## 2015-07-09 NOTE — Patient Instructions (Addendum)
Regrese a la clinica cuando tenga su cita. Si tiene problemas o preguntas, llama a la clinica o vaya a la [facility] al [facility].    

## 2015-07-09 NOTE — Progress Notes (Signed)
Subjective:  Teresa Orozco is a 40 y.o. [id number] at 5573w4d being seen today for ongoing prenatal care.  [name] and has Eustachian tube dysfunction; Supervision of high risk pregnancy, antepartum; AMA (advanced maternal age) multigravida 35+; and UTI (urinary tract infection) in pregnancy, antepartum on her problem list. Patient is Spanish-speaking only, Spanish interpreter present for this encounter.  Patient reports no complaints.  Contractions: Not present. Vag. Bleeding: None.  Movement: Present. Denies leaking of fluid.   The following portions of the patient's history were reviewed and updated as appropriate: allergies, current medications, past family history, past medical history, past social history, past surgical history and problem list. Problem list updated.  Objective:   Filed Vitals:   07/09/15 0827  BP: 118/69  Pulse: 94  Weight: 194 lb (87.998 kg)    Fetal Status: Fetal Heart Rate (bpm): NST Fundal Height: 39 cm Movement: Present     General:  Alert, oriented and cooperative. Patient is in no acute distress.  Skin: Skin is warm and dry. No rash noted.   Cardiovascular: Normal heart rate noted  Respiratory: Normal respiratory effort, no problems with respiration noted  Abdomen: Soft, gravid, appropriate for gestational age. Pain/Pressure: Present     Pelvic: Cervical exam deferred        Extremities: Normal range of motion.  Edema: None  Mental Status: Normal mood and affect. Normal behavior. Normal judgment and thought content.   Urinalysis: Urine Protein: Negative Urine Glucose: Negative NST performed today was reviewed and was found to be reactive.   Assessment and Plan:  Pregnancy: [id number] at 2873w4d  1. AMA (advanced maternal age) multigravida 35+, third trimester  Continue recommended antenatal testing and prenatal care. Growth scan ordered.  IOL at 40 weeks. - Fetal nonstress test - US MFM OB  LIMITED; Future - US MFM OB FOLLOW UP; Future  2. Supervision of high risk pregnancy, antepartum, third trimester Term labor symptoms and general obstetric precautions including but not limited to vaginal bleeding, contractions, leaking of fluid and fetal movement were reviewed in detail with the patient. Please refer to After Visit Summary for other counseling recommendations.  Return in about 1 week (around 07/16/2015) for NST, OB Visit.   Tereso NewcomerUgonna A Cederic Mozley, MD

## 2015-07-12 ENCOUNTER — Other Ambulatory Visit: Payer: Self-pay

## 2015-07-13 ENCOUNTER — Ambulatory Visit (HOSPITAL_COMMUNITY): Payer: Self-pay

## 2015-07-13 ENCOUNTER — Ambulatory Visit (INDEPENDENT_AMBULATORY_CARE_PROVIDER_SITE_OTHER): Payer: Self-pay | Admitting: General Practice

## 2015-07-13 ENCOUNTER — Ambulatory Visit (HOSPITAL_COMMUNITY)
Admission: RE | Admit: 2015-07-13 | Discharge: 2015-07-13 | Disposition: A | Discharge status: Home / Self Care | Payer: Self-pay | Source: Ambulatory Visit | Attending: Obstetrics & Gynecology | Admitting: Obstetrics & Gynecology

## 2015-07-13 VITALS — BP 114/62

## 2015-07-13 DIAGNOSIS — O09523 Supervision of elderly multigravida, third trimester: Secondary | ICD-10-CM

## 2015-07-15 ENCOUNTER — Ambulatory Visit (INDEPENDENT_AMBULATORY_CARE_PROVIDER_SITE_OTHER): Payer: Self-pay | Admitting: Family

## 2015-07-15 VITALS — BP 117/66 | HR 100 | Wt 192.5 lb

## 2015-07-15 DIAGNOSIS — O2343 Unspecified infection of urinary tract in pregnancy, third trimester: Secondary | ICD-10-CM

## 2015-07-15 DIAGNOSIS — O09523 Supervision of elderly multigravida, third trimester: Secondary | ICD-10-CM

## 2015-07-15 DIAGNOSIS — O0993 Supervision of high risk pregnancy, unspecified, third trimester: Secondary | ICD-10-CM

## 2015-07-15 DIAGNOSIS — R8271 Bacteriuria: Secondary | ICD-10-CM

## 2015-07-15 LAB — POCT URINALYSIS DIP (DEVICE)
Bilirubin Urine: NEGATIVE
Glucose, UA: NEGATIVE mg/dL
Ketones, ur: NEGATIVE mg/dL
NITRITE: NEGATIVE
PH: 6.5 (ref 5.0–8.0)
Protein, ur: NEGATIVE mg/dL
SPECIFIC GRAVITY, URINE: 1.02 (ref 1.005–1.030)
UROBILINOGEN UA: 1 mg/dL (ref 0.0–1.0)

## 2015-07-15 NOTE — Progress Notes (Signed)
Induction scheduled 07/10 @ 630am.

## 2015-07-15 NOTE — Progress Notes (Signed)
Subjective:  Teresa Orozco is a 40 y.o. [id number] at 7122w3d being seen today for ongoing prenatal care.  [name] and has Eustachian tube dysfunction; Supervision of high risk pregnancy, antepartum; AMA (advanced maternal age) multigravida 35+; and UTI (urinary tract infection) in pregnancy, antepartum on her problem list.  Patient reports no complaints.  Denies UTI symptoms.   Contractions: Not present. Vag. Bleeding: None.  Movement: Present. Denies leaking of fluid.   The following portions of the patient's history were reviewed and updated as appropriate: allergies, current medications, past family history, past medical history, past social history, past surgical history and problem list. Problem list updated.  Objective:   Filed Vitals:   07/15/15 0846  BP: 117/66  Pulse: 100  Weight: 192 lb 8 oz (87.317 kg)    Fetal Status: Fetal Heart Rate (bpm): NST-R Fundal Height: 40 cm Movement: Present  Presentation: Vertex  General:  Alert, oriented and cooperative. Patient is in no acute distress.  Skin: Skin is warm and dry. No rash noted.   Cardiovascular: Normal heart rate noted  Respiratory: Normal respiratory effort, no problems with respiration noted  Abdomen: Soft, gravid, appropriate for gestational age. Pain/Pressure: Present     Pelvic: Cervical exam deferred        Extremities: Normal range of motion.  Edema: None  Mental Status: Normal mood and affect. Normal behavior. Normal judgment and thought content.   Urinalysis: Urine Protein: Negative Urine Glucose: Negative  Assessment and Plan:  Pregnancy: [id number] at 1522w3d  1. AMA (advanced maternal age) multigravida 35+, third trimester - Fetal nonstress test > reactive - Schedule IOL for 40 wks  2. Bacteria in urine - Culture, OB Urine - Culture, OB Urine  3. Supervision of high risk pregnancy, antepartum, third trimester - Continue close  monitoring  Term labor symptoms and general obstetric precautions including but not limited to vaginal bleeding, contractions, leaking of fluid and fetal movement were reviewed in detail with the patient. Please refer to After Visit Summary for other counseling recommendations.  Return in about 7 days (around 07/22/2015) for nst at 1pm.   Marlis EdelsonWalidah N Karim, CNM

## 2015-07-15 NOTE — Progress Notes (Signed)
Used Tax adviserVideo Interpreter Priscilla [phone]#[id number].Large leukocytes noted on urinalysis.

## 2015-07-16 ENCOUNTER — Ambulatory Visit (HOSPITAL_COMMUNITY)
Admission: RE | Admit: 2015-07-16 | Discharge: 2015-07-16 | Disposition: A | Discharge status: Home / Self Care | Payer: Self-pay | Source: Ambulatory Visit | Attending: Obstetrics & Gynecology | Admitting: Obstetrics & Gynecology

## 2015-07-16 ENCOUNTER — Encounter (HOSPITAL_COMMUNITY): Payer: Self-pay

## 2015-07-16 DIAGNOSIS — O09523 Supervision of elderly multigravida, third trimester: Secondary | ICD-10-CM | POA: Insufficient documentation

## 2015-07-16 DIAGNOSIS — Z3A38 38 weeks gestation of pregnancy: Secondary | ICD-10-CM | POA: Insufficient documentation

## 2015-07-16 LAB — CULTURE, OB URINE

## 2015-07-19 ENCOUNTER — Ambulatory Visit (INDEPENDENT_AMBULATORY_CARE_PROVIDER_SITE_OTHER): Payer: Self-pay | Admitting: Obstetrics and Gynecology

## 2015-07-19 VITALS — BP 118/65 | HR 91 | Wt 190.0 lb

## 2015-07-19 DIAGNOSIS — O0993 Supervision of high risk pregnancy, unspecified, third trimester: Secondary | ICD-10-CM

## 2015-07-19 DIAGNOSIS — O09523 Supervision of elderly multigravida, third trimester: Secondary | ICD-10-CM

## 2015-07-19 LAB — POCT URINALYSIS DIP (DEVICE)
BILIRUBIN URINE: NEGATIVE
Glucose, UA: NEGATIVE mg/dL
Hgb urine dipstick: NEGATIVE
Ketones, ur: NEGATIVE mg/dL
NITRITE: NEGATIVE
PH: 7 (ref 5.0–8.0)
PROTEIN: NEGATIVE mg/dL
Specific Gravity, Urine: 1.02 (ref 1.005–1.030)
Urobilinogen, UA: 0.2 mg/dL (ref 0.0–1.0)

## 2015-07-19 NOTE — Progress Notes (Signed)
Subjective:  Teresa Orozco is a 40 y.o. [id number] at 720w0d being seen today for ongoing prenatal care.  [name] and has Eustachian tube dysfunction; Supervision of high risk pregnancy, antepartum; AMA (advanced maternal age) multigravida 35+; and UTI (urinary tract infection) in pregnancy, antepartum on her problem list.  Patient reports left ear pain similar to previous ear infection.  Contractions: Not present. Vag. Bleeding: None.  Movement: Present. Denies leaking of fluid.   The following portions of the patient's history were reviewed and updated as appropriate: allergies, current medications, past family history, past medical history, past social history, past surgical history and problem list. Problem list updated.  Objective:   Filed Vitals:   07/19/15 0844  BP: 118/65  Pulse: 91  Weight: 190 lb (86.183 kg)    Fetal Status: Fetal Heart Rate (bpm): NST   Movement: Present     General:  Alert, oriented and cooperative. Patient is in no acute distress.  Skin: Skin is warm and dry. No rash noted.   Cardiovascular: Normal heart rate noted  Respiratory: Normal respiratory effort, no problems with respiration noted  Abdomen: Soft, gravid, appropriate for gestational age. Pain/Pressure: Present     Pelvic: Cervical exam deferred        Extremities: Normal range of motion.  Edema: Trace  Mental Status: Normal mood and affect. Normal behavior. Normal judgment and thought content.   Urinalysis: Urine Protein: Negative Urine Glucose: Negative  Assessment and Plan:  Pregnancy: [id number] at 4020w0d  1. AMA (advanced maternal age) multigravida 35+, third trimester Will be scheduled for IOL at 40 weeks - Fetal nonstress test- reviewed and reactive  2. Supervision of high risk pregnancy, antepartum, third trimester Left ear appears normal on exam She may continue using ear drops that she has.   Term labor symptoms and  general obstetric precautions including but not limited to vaginal bleeding, contractions, leaking of fluid and fetal movement were reviewed in detail with the patient. Please refer to After Visit Summary for other counseling recommendations.  Return for 7/6-7/7 for nst/afi.   Catalina AntiguaPeggy Emilyanne Mcgough, MD

## 2015-07-19 NOTE — Progress Notes (Signed)
Patient reports left ear pain and itching in her ear that started Friday evening. Patient states this feels similar to a previous ear infection.  Glenda used for interpreter for check in

## 2015-07-21 ENCOUNTER — Telehealth (HOSPITAL_COMMUNITY): Payer: Self-pay | Admitting: *Deleted

## 2015-07-21 NOTE — Telephone Encounter (Signed)
Preadmission screen  

## 2015-07-23 ENCOUNTER — Encounter: Payer: Self-pay | Admitting: *Deleted

## 2015-07-26 ENCOUNTER — Encounter (HOSPITAL_COMMUNITY): Payer: Self-pay

## 2015-07-26 ENCOUNTER — Inpatient Hospital Stay (HOSPITAL_COMMUNITY): Payer: Medicaid Other | Admitting: Anesthesiology

## 2015-07-26 ENCOUNTER — Inpatient Hospital Stay (HOSPITAL_COMMUNITY)
Admission: RE | Admit: 2015-07-26 | Discharge: 2015-07-28 | DRG: 775 | Disposition: A | Discharge status: Home / Self Care | Payer: Medicaid Other | Source: Ambulatory Visit | Attending: Obstetrics & Gynecology | Admitting: Obstetrics & Gynecology

## 2015-07-26 VITALS — BP 103/57 | HR 79 | Temp 97.9°F | Resp 18 | Ht 65.0 in | Wt 192.0 lb

## 2015-07-26 DIAGNOSIS — Z3A40 40 weeks gestation of pregnancy: Secondary | ICD-10-CM

## 2015-07-26 DIAGNOSIS — O09523 Supervision of elderly multigravida, third trimester: Secondary | ICD-10-CM

## 2015-07-26 DIAGNOSIS — O0993 Supervision of high risk pregnancy, unspecified, third trimester: Secondary | ICD-10-CM

## 2015-07-26 DIAGNOSIS — O2340 Unspecified infection of urinary tract in pregnancy, unspecified trimester: Secondary | ICD-10-CM

## 2015-07-26 DIAGNOSIS — O09529 Supervision of elderly multigravida, unspecified trimester: Secondary | ICD-10-CM

## 2015-07-26 LAB — CBC
HEMATOCRIT: 38.3 % (ref 36.0–46.0)
Hemoglobin: 12.8 g/dL (ref 12.0–15.0)
MCH: 30.3 pg (ref 26.0–34.0)
MCHC: 33.4 g/dL (ref 30.0–36.0)
MCV: 90.8 fL (ref 78.0–100.0)
Platelets: 296 10*3/uL (ref 150–400)
RBC: 4.22 MIL/uL (ref 3.87–5.11)
RDW: 15.7 % — AB (ref 11.5–15.5)
WBC: 10.6 10*3/uL — ABNORMAL HIGH (ref 4.0–10.5)

## 2015-07-26 LAB — TYPE AND SCREEN
ABO/RH(D): A POS
Antibody Screen: NEGATIVE

## 2015-07-26 LAB — RPR: RPR Ser Ql: NONREACTIVE

## 2015-07-26 LAB — ABO/RH: ABO/RH(D): A POS

## 2015-07-26 MED ORDER — HYDROXYZINE HCL 50 MG PO TABS
50.0000 mg | ORAL_TABLET | Freq: Four times a day (QID) | ORAL | Status: DC | PRN
  Filled 2015-07-26: qty 1

## 2015-07-26 MED ORDER — FENTANYL CITRATE (PF) 100 MCG/2ML IJ SOLN: 50.0000 ug | INTRAMUSCULAR | Status: DC | PRN

## 2015-07-26 MED ORDER — PRENATAL MULTIVITAMIN CH
1.0000 | ORAL_TABLET | Freq: Every day | ORAL | Status: DC
  Administered 2015-07-27 – 2015-07-28 (×2): 1 via ORAL
  Filled 2015-07-26 (×2): qty 1

## 2015-07-26 MED ORDER — FENTANYL 2.5 MCG/ML BUPIVACAINE 1/10 % EPIDURAL INFUSION (WH - ANES)
INTRAMUSCULAR | Status: AC
  Filled 2015-07-26: qty 125

## 2015-07-26 MED ORDER — OXYCODONE-ACETAMINOPHEN 5-325 MG PO TABS: 2.0000 | ORAL_TABLET | ORAL | Status: DC | PRN

## 2015-07-26 MED ORDER — IBUPROFEN 600 MG PO TABS
600.0000 mg | ORAL_TABLET | Freq: Four times a day (QID) | ORAL | Status: DC
  Administered 2015-07-26 – 2015-07-28 (×7): 600 mg via ORAL
  Filled 2015-07-26 (×7): qty 1

## 2015-07-26 MED ORDER — ZOLPIDEM TARTRATE 5 MG PO TABS: 5.0000 mg | ORAL_TABLET | Freq: Every evening | ORAL | Status: DC | PRN

## 2015-07-26 MED ORDER — LACTATED RINGERS IV SOLN
500.0000 mL | Freq: Once | INTRAVENOUS | Status: AC
  Administered 2015-07-26: 500 mL via INTRAVENOUS

## 2015-07-26 MED ORDER — MISOPROSTOL 25 MCG QUARTER TABLET
25.0000 ug | ORAL_TABLET | ORAL | Status: DC | PRN
  Filled 2015-07-26: qty 1

## 2015-07-26 MED ORDER — PHENYLEPHRINE 40 MCG/ML (10ML) SYRINGE FOR IV PUSH (FOR BLOOD PRESSURE SUPPORT)
80.0000 ug | PREFILLED_SYRINGE | INTRAVENOUS | Status: DC | PRN
  Filled 2015-07-26: qty 5

## 2015-07-26 MED ORDER — COCONUT OIL OIL: 1.0000 "application " | TOPICAL_OIL | Status: DC | PRN

## 2015-07-26 MED ORDER — SODIUM CHLORIDE 0.9% FLUSH: 3.0000 mL | Freq: Two times a day (BID) | INTRAVENOUS | Status: DC

## 2015-07-26 MED ORDER — OXYCODONE-ACETAMINOPHEN 5-325 MG PO TABS: 1.0000 | ORAL_TABLET | ORAL | Status: DC | PRN

## 2015-07-26 MED ORDER — LIDOCAINE HCL (PF) 1 % IJ SOLN
INTRAMUSCULAR | Status: DC | PRN
  Administered 2015-07-26 (×2): 5 mL

## 2015-07-26 MED ORDER — OXYTOCIN BOLUS FROM INFUSION
500.0000 mL | INTRAVENOUS | Status: DC
  Administered 2015-07-26: 500 mL via INTRAVENOUS

## 2015-07-26 MED ORDER — TERBUTALINE SULFATE 1 MG/ML IJ SOLN
0.2500 mg | Freq: Once | INTRAMUSCULAR | Status: DC | PRN
  Filled 2015-07-26: qty 1

## 2015-07-26 MED ORDER — FENTANYL 2.5 MCG/ML BUPIVACAINE 1/10 % EPIDURAL INFUSION (WH - ANES)
14.0000 mL/h | INTRAMUSCULAR | Status: DC | PRN
  Administered 2015-07-26 (×2): 14 mL/h via EPIDURAL

## 2015-07-26 MED ORDER — SOD CITRATE-CITRIC ACID 500-334 MG/5ML PO SOLN: 30.0000 mL | ORAL | Status: DC | PRN

## 2015-07-26 MED ORDER — WITCH HAZEL-GLYCERIN EX PADS: 1.0000 "application " | MEDICATED_PAD | CUTANEOUS | Status: DC | PRN

## 2015-07-26 MED ORDER — ONDANSETRON HCL 4 MG/2ML IJ SOLN: 4.0000 mg | INTRAMUSCULAR | Status: DC | PRN

## 2015-07-26 MED ORDER — DIPHENHYDRAMINE HCL 25 MG PO CAPS: 25.0000 mg | ORAL_CAPSULE | Freq: Four times a day (QID) | ORAL | Status: DC | PRN

## 2015-07-26 MED ORDER — ACETAMINOPHEN 325 MG PO TABS: 650.0000 mg | ORAL_TABLET | ORAL | Status: DC | PRN

## 2015-07-26 MED ORDER — OXYTOCIN 40 UNITS IN LACTATED RINGERS INFUSION - SIMPLE MED: 2.5000 [IU]/h | INTRAVENOUS | Status: DC

## 2015-07-26 MED ORDER — PHENYLEPHRINE 40 MCG/ML (10ML) SYRINGE FOR IV PUSH (FOR BLOOD PRESSURE SUPPORT)
PREFILLED_SYRINGE | INTRAVENOUS | Status: AC
  Filled 2015-07-26: qty 20

## 2015-07-26 MED ORDER — SIMETHICONE 80 MG PO CHEW
80.0000 mg | CHEWABLE_TABLET | ORAL | Status: DC | PRN
Start: 2015-07-26 — End: 2015-07-28

## 2015-07-26 MED ORDER — ONDANSETRON HCL 4 MG PO TABS: 4.0000 mg | ORAL_TABLET | ORAL | Status: DC | PRN

## 2015-07-26 MED ORDER — DIPHENHYDRAMINE HCL 50 MG/ML IJ SOLN: 12.5000 mg | INTRAMUSCULAR | Status: DC | PRN

## 2015-07-26 MED ORDER — OXYTOCIN 40 UNITS IN LACTATED RINGERS INFUSION - SIMPLE MED
1.0000 m[IU]/min | INTRAVENOUS | Status: DC
  Administered 2015-07-26: 2 m[IU]/min via INTRAVENOUS

## 2015-07-26 MED ORDER — LACTATED RINGERS IV SOLN
INTRAVENOUS | Status: DC
  Administered 2015-07-26 (×2): via INTRAVENOUS

## 2015-07-26 MED ORDER — OXYTOCIN 40 UNITS IN LACTATED RINGERS INFUSION - SIMPLE MED
INTRAVENOUS | Status: AC
  Filled 2015-07-26: qty 1000

## 2015-07-26 MED ORDER — OXYTOCIN 10 UNIT/ML IJ SOLN
10.0000 [IU] | Freq: Once | INTRAMUSCULAR | Status: DC
Start: 2015-07-26 — End: 2015-07-26

## 2015-07-26 MED ORDER — EPHEDRINE 5 MG/ML INJ
10.0000 mg | INTRAVENOUS | Status: DC | PRN
  Filled 2015-07-26: qty 2

## 2015-07-26 MED ORDER — LACTATED RINGERS IV SOLN
500.0000 mL | INTRAVENOUS | Status: DC | PRN
Start: 2015-07-26 — End: 2015-07-26

## 2015-07-26 MED ORDER — DIBUCAINE 1 % RE OINT: 1.0000 "application " | TOPICAL_OINTMENT | RECTAL | Status: DC | PRN

## 2015-07-26 MED ORDER — SODIUM CHLORIDE 0.9 % IV SOLN
250.0000 mL | INTRAVENOUS | Status: DC | PRN
Start: 2015-07-26 — End: 2015-07-26

## 2015-07-26 MED ORDER — ONDANSETRON HCL 4 MG/2ML IJ SOLN: 4.0000 mg | Freq: Four times a day (QID) | INTRAMUSCULAR | Status: DC | PRN

## 2015-07-26 MED ORDER — TETANUS-DIPHTH-ACELL PERTUSSIS 5-2.5-18.5 LF-MCG/0.5 IM SUSP: 0.5000 mL | Freq: Once | INTRAMUSCULAR | Status: DC

## 2015-07-26 MED ORDER — SODIUM CHLORIDE 0.9% FLUSH: 3.0000 mL | INTRAVENOUS | Status: DC | PRN

## 2015-07-26 MED ORDER — SENNOSIDES-DOCUSATE SODIUM 8.6-50 MG PO TABS
2.0000 | ORAL_TABLET | ORAL | Status: DC
  Administered 2015-07-26 – 2015-07-27 (×2): 2 via ORAL
  Filled 2015-07-26 (×2): qty 2

## 2015-07-26 MED ORDER — BENZOCAINE-MENTHOL 20-0.5 % EX AERO: 1.0000 "application " | INHALATION_SPRAY | CUTANEOUS | Status: DC | PRN

## 2015-07-26 MED ORDER — LIDOCAINE HCL (PF) 1 % IJ SOLN
30.0000 mL | INTRAMUSCULAR | Status: DC | PRN
Start: 2015-07-26 — End: 2015-07-26
  Filled 2015-07-26: qty 30

## 2015-07-26 NOTE — Progress Notes (Signed)
I was present during the epidural with Dr. Acey Lavarignan. Teresa Orozco  Interpreter.

## 2015-07-26 NOTE — Anesthesia Procedure Notes (Signed)
Epidural Patient location during procedure: OB  Staffing Anesthesiologist: Ahni Bradwell Performed by: anesthesiologist   Preanesthetic Checklist Completed: patient identified, site marked, surgical consent, pre-op evaluation, timeout performed, IV checked, risks and benefits discussed and monitors and equipment checked  Epidural Patient position: sitting Prep: DuraPrep Patient monitoring: heart rate, continuous pulse ox and blood pressure Approach: right paramedian Location: L3-L4 Injection technique: LOR saline  Needle:  Needle type: Tuohy  Needle gauge: 17 G Needle length: 9 cm and 9 Needle insertion depth: 5 cm Catheter type: closed end flexible Catheter size: 20 Guage Catheter at skin depth: 10 cm Test dose: negative  Assessment Events: blood not aspirated, injection not painful, no injection resistance, negative IV test and no paresthesia  Additional Notes Patient identified. Risks/Benefits/Options discussed with patient including but not limited to bleeding, infection, nerve damage, paralysis, failed block, incomplete pain control, headache, blood pressure changes, nausea, vomiting, reactions to medication both or allergic, itching and postpartum back pain. Confirmed with bedside nurse the patient's most recent platelet count. Confirmed with patient that they are not currently taking any anticoagulation, have any bleeding history or any family history of bleeding disorders. Patient expressed understanding and wished to proceed. All questions were answered. Sterile technique was used throughout the entire procedure. Please see nursing notes for vital signs. Test dose was given through epidural needle and negative prior to continuing to dose epidural or start infusion. Warning signs of high block given to the patient including shortness of breath, tingling/numbness in hands, complete motor block, or any concerning symptoms with instructions to call for help. Patient was given  instructions on fall risk and not to get out of bed. All questions and concerns addressed with instructions to call with any issues.   

## 2015-07-26 NOTE — H&P (Signed)
Teresa Orozco is a 40 y.o. female [id number] @ 40 wks  presenting for IOL for AMA. Maternal Medical History:  Reason for admission: Admitted for IOL for AMA  Fetal activity: Perceived fetal activity is normal.   Last perceived fetal movement was within the past hour.    Prenatal complications: no prenatal complications Prenatal Complications - Diabetes: none.    OB History    Gravida Para Term Preterm AB TAB SAB Ectopic Multiple Living   8 4 4  3  3   0 4     No past medical history on file. Past Surgical History  Procedure Laterality Date  . Dilation and curettage of uterus  2008   Family History: family history is not on file. Social History:  reports that she has never smoked. She has never used smokeless tobacco. She reports that she does not drink alcohol or use illicit drugs.   Prenatal Transfer Tool  Maternal Diabetes: No Genetic Screening: Normal Maternal Ultrasounds/Referrals: Normal Fetal Ultrasounds or other Referrals:  None Maternal Substance Abuse:  No Significant Maternal Medications:  None Significant Maternal Lab Results:  None Other Comments:  None  Review of Systems  Constitutional: Negative.   HENT: Negative.   Eyes: Negative.   Respiratory: Negative.   Cardiovascular: Negative.   Gastrointestinal: Negative.   Genitourinary: Negative.   Musculoskeletal: Negative.   Skin: Negative.   Neurological: Negative.   Endo/Heme/Allergies: Negative.   Psychiatric/Behavioral: Negative.     Dilation: 4.5 Effacement (%): 80 Station: +1 Exam by:: Teresa BoersLawson, Teresa Orozco Blood pressure 125/77, pulse 101, temperature 98.4 F (36.9 C), temperature source Oral, resp. rate 18, last menstrual period 10/26/2014. Maternal Exam:  Uterine Assessment: none  Abdomen: Patient reports no abdominal tenderness. Fetal presentation: vertex  Introitus: Normal vulva. Normal vagina.  Amniotic fluid character: not assessed.  Pelvis: adequate for delivery.   Cervix: Cervix  evaluated by digital exam.     Fetal Exam Fetal Monitor Review: Mode: ultrasound.   Variability: moderate (6-25 bpm).   Pattern: accelerations present.    Fetal State Assessment: Category I - tracings are normal.     Physical Exam  Constitutional: She is oriented to person, place, and time. She appears well-developed and well-nourished.  HENT:  Head: Normocephalic.  Eyes: Pupils are equal, round, and reactive to light.  Neck: Normal range of motion.  Cardiovascular: Normal rate, regular rhythm, normal heart sounds and intact distal pulses.   Respiratory: Effort normal and breath sounds normal.  GI: Soft. Bowel sounds are normal.  Genitourinary: Vagina normal and uterus normal.  Musculoskeletal: Normal range of motion.  Neurological: She is alert and oriented to person, place, and time. She has normal reflexes.  Skin: Skin is warm and dry.  Psychiatric: She has a normal mood and affect. Her behavior is normal. Judgment and thought content normal.    Prenatal labs: ABO, Rh: A/POS/-- (12/29 [id number]) Antibody: NEG (12/29 0937) Rubella: 3.31 (12/29 0937) RPR: NON REAC (04/20 1102)  HBsAg: NEGATIVE (12/29 [id number])  HIV: NONREACTIVE (04/20 1102)  GBS: Negative (06/17 0000)   Assessment/Plan: SVE 4-5/80/-1, GBS neg, admit for IOL   Teresa Orozco 07/26/2015, 7:47 AM

## 2015-07-26 NOTE — Progress Notes (Signed)
I assisted RN with admission questions and explanation of care plan.  Eda H Royal  Interpreter.

## 2015-07-26 NOTE — Anesthesia Preprocedure Evaluation (Signed)

## 2015-07-26 NOTE — Progress Notes (Signed)
LABOR PROGRESS NOTE  Teresa Orozco is a 40 y.o. [id number] at 4731w0d  admitted for IOL for AMA.   Subjective: Pt resting comfortably after epidural.   Objective: BP 103/60 mmHg  Pulse 98  Temp(Src) 98.3 F (36.8 C) (Oral)  Resp 16  Ht 5\' 5"  (1.651 m)  Wt 87.091 kg (192 lb)  BMI 31.95 kg/m2  SpO2 100%  LMP 10/26/2014 (Exact Date) or  Filed Vitals:   07/26/15 1300 07/26/15 1330 07/26/15 1400 07/26/15 1430  BP: 118/67 115/66 123/68 103/60  Pulse: 83 93 105 98  Temp:      TempSrc:      Resp: 18  16   Height:      Weight:      SpO2:         Dilation: 6 Effacement (%): 90 Cervical Position: Middle Station: -2 Presentation: Vertex Exam by:: Dr. Erin FullingHarraway-Smith  Labs: Lab Results  Component Value Date   WBC 10.6* 07/26/2015   HGB 12.8 07/26/2015   HCT 38.3 07/26/2015   MCV 90.8 07/26/2015   PLT 296 07/26/2015    Patient Active Problem List   Diagnosis Date Noted  . Advanced maternal age in multigravida 07/26/2015  . UTI (urinary tract infection) in pregnancy, antepartum 01/16/2015  . Supervision of high risk pregnancy, antepartum 01/14/2015  . AMA (advanced maternal age) multigravida 35+ 01/14/2015  . Eustachian tube dysfunction 02/16/2012    Assessment / Plan: 40 y.o. [id number] at 8131w0d here for IOL for AMA.   Labor: s/p pit, AROM at 1440 by Dr. Erin FullingHarraway-Smith Fetal Wellbeing:  Occasional variable Pain Control:  S/p epidural Anticipated MOD:  SVD  Teresa MuseKate Shastina Rua, MD 07/26/2015, 2:57 PM

## 2015-07-26 NOTE — Anesthesia Pain Management Evaluation Note (Signed)
  CRNA Pain Management Visit Note  Patient: Teresa SchaumannAna Propst, 40 y.o., female  "Hello I am a [facility] the [facility]. We have an anesthesia team available at all times to provide care throughout the hospital, including epidural management and anesthesia for C-section. I don't know your plan for the delivery whether it a natural birth, water birth, IV sedation, nitrous supplementation, doula or epidural, but we want to meet your pain goals."   1.Was your pain managed to your expectations on prior hospitalizations?   Yes   2.What is your expectation for pain management during this hospitalization?     Epidural  3.How can we help you reach that goal? Epidural.  Record the patient's initial score and the patient's pain goal.   Pain: 0  Pain Goal: 0 [facility] wants you to be able to say your pain was always managed very well.  Benny Deutschman L 07/26/2015

## 2015-07-26 NOTE — Progress Notes (Signed)
I was present during the delivery with Misty StanleyLisa CNM by Orlan LeavensViria Alvarez Spanish Interpreter.

## 2015-07-27 NOTE — Progress Notes (Signed)
Post Partum Day 1  Subjective:  Teresa Orozco is a 40 y.o. [id number] 7968w0d s/p SVD.  No acute events overnight.  Pt denies problems with ambulating, voiding or po intake.  She denies nausea or vomiting.  Pain is well controlled.  She has had flatus. She has not had bowel movement.  Lochia Small.  Plan for birth control is IUD.  Method of Feeding: breast  Objective: BP 107/57 mmHg  Pulse 91  Temp(Src) 98.2 F (36.8 C) (Oral)  Resp 18  Ht 5\' 5"  (1.651 m)  Wt 87.091 kg (192 lb)  BMI 31.95 kg/m2  SpO2 100%  LMP 10/26/2014 (Exact Date)  Breastfeeding? Unknown  Physical Exam:  General: alert, cooperative and no distress Lochia:normal flow Chest: CTAB Heart: RRR no m/r/g Abdomen: +BS, soft, nontender, fundus firm at/below umbilicus Uterine Fundus: firm, nontender DVT Evaluation: No evidence of DVT seen on physical exam. Extremities: no edema   Recent Labs  07/26/15 0730  HGB 12.8  HCT 38.3    Assessment/Plan:  ASSESSMENT: Teresa Schaumannna Clay is a 40 y.o. [id number] 3468w0d ppd #1 s/p NSVD doing well.   Plan for discharge tomorrow and Breastfeeding   LOS: 1 day   Loni MuseKate Timberlake 07/27/2015, 9:59 AM

## 2015-07-27 NOTE — Anesthesia Postprocedure Evaluation (Signed)
Anesthesia Post Note  Patient: Teresa Orozco  Procedure(s) Performed: * No procedures listed *  Patient location during evaluation: Mother Baby Anesthesia Type: Epidural Level of consciousness: oriented and awake and alert Pain management: pain level controlled Vital Signs Assessment: post-procedure vital signs reviewed and stable Respiratory status: spontaneous breathing and nonlabored ventilation Cardiovascular status: stable Postop Assessment: epidural receding, patient able to bend at knees, no signs of nausea or vomiting and adequate PO intake Anesthetic complications: no     Last Vitals:  Filed Vitals:   07/27/15 0230 07/27/15 0700  BP: 99/53 107/57  Pulse: 75 91  Temp: 36.9 C 36.8 C  Resp: 16 18    Last Pain:  Filed Vitals:   07/27/15 0716  PainSc: 0-No pain   Pain Goal: Patients Stated Pain Goal: 4 (07/26/15 0746)               Laban EmperorMalinova,Keiasia Christianson Hristova

## 2015-07-27 NOTE — Progress Notes (Signed)
I assisted Dr. Timberlake with questions. Eda H Royal Interpreter. °

## 2015-07-27 NOTE — Lactation Note (Signed)
This note was copied from a baby's chart. Lactation Consultation Note Mom was going to breast/formula. Mom has now changed to breast/bottle. Mom has changed her mind about breast feeding. Mom is now just formula feeding.  Patient Name: Girl Cheron Schaumannna Trolinger BJYNW'GToday's Date: 07/27/2015 Reason for consult: Initial assessment   Maternal Data    Feeding Feeding Type: Formula Nipple Type: Slow - flow  LATCH Score/Interventions                      Lactation Tools Discussed/Used     Consult Status Consult Status: Complete Date: 07/27/15    Charyl DancerCARVER, Bryauna Byrum G 07/27/2015, 6:29 AM

## 2015-07-28 MED ORDER — IBUPROFEN 600 MG PO TABS: 600.0000 mg | ORAL_TABLET | Freq: Four times a day (QID) | ORAL | Status: DC

## 2015-07-28 MED ORDER — ACETAMINOPHEN 325 MG PO TABS: 650.0000 mg | ORAL_TABLET | ORAL | Status: DC | PRN

## 2015-07-28 NOTE — Progress Notes (Signed)
Patient given tdap VIS, she stated she has not had vaccine and will decide if she wants or not.

## 2015-07-28 NOTE — Discharge Summary (Signed)
OB Discharge Summary     Patient Name: Teresa Orozco DOB: 04/24/1975 MRN: [id number]  Date of admission: 07/26/2015 Delivering MD: Sharen CounterLEFTWICH-KIRBY, LISA A   Date of discharge: 07/28/2015  Admitting diagnosis: INDUCTION Intrauterine pregnancy: 3621w0d     Secondary diagnosis:  Active Problems:   Advanced maternal age in multigravida   NSVD (normal spontaneous vaginal delivery)  Additional problems: none     Discharge diagnosis: Term Pregnancy Delivered                                                                                                Post partum procedures:none  Augmentation: AROM and Pitocin  Complications: None  Hospital course:  Induction of Labor With Vaginal Delivery   40 y.o. yo [id number] at 4821w0d was admitted to the hospital 07/26/2015 for induction of labor.  Indication for induction: Postdates.  Patient had an uncomplicated labor course as follows: Membrane Rupture Time/Date: 2:47 PM ,07/26/2015   Intrapartum Procedures: Episiotomy: None [1]                                         Lacerations:  2nd degree [3];Perineal [11]  Patient had delivery of a Viable infant.  Information for the patient's newborn:  Geryl Rankinsrmenta, Girl Anushree [[id number]][[id number]]  Delivery Method: Vaginal, Spontaneous Delivery (Filed from Delivery Summary)   07/26/2015  Details of delivery can be found in separate delivery note.  Patient had a routine postpartum course. Patient is discharged home 07/28/2015.   Physical exam  Filed Vitals:   07/27/15 0230 07/27/15 0700 07/27/15 1858 07/28/15 0531  BP: 99/53 107/57 104/63 103/57  Pulse: 75 91 88 79  Temp: 98.4 F (36.9 C) 98.2 F (36.8 C) 98.1 F (36.7 C) 97.9 F (36.6 C)  TempSrc: Oral  Oral Oral  Resp: 16 18 18 18   Height:      Weight:      SpO2:       General: alert, cooperative and no distress Lochia: appropriate Uterine Fundus: firm Incision: N/A DVT Evaluation: No cords or calf tenderness. No significant calf/ankle edema. Labs: Lab  Results  Component Value Date   WBC 10.6* 07/26/2015   HGB 12.8 07/26/2015   HCT 38.3 07/26/2015   MCV 90.8 07/26/2015   PLT 296 07/26/2015   No flowsheet data found.  Discharge instruction: per After Visit Summary and "Baby and Me Booklet".  After visit meds:    Medication List    TAKE these medications        acetaminophen 325 MG tablet  Commonly known as:  TYLENOL  Take 2 tablets (650 mg total) by mouth every 4 (four) hours as needed (for pain scale < 4).     ibuprofen 600 MG tablet  Commonly known as:  ADVIL,MOTRIN  Take 1 tablet (600 mg total) by mouth every 6 (six) hours.     Prenatal Vitamins 0.8 MG tablet  Take 1 tablet by mouth daily.        Diet: routine diet  Activity: Advance  as tolerated. Pelvic rest for 6 weeks.   Outpatient follow up:6 weeks Follow up Appt:No future appointments. Follow up Visit:No Follow-up on file.  Postpartum contraception: IUD Mirena  Newborn Data: Live born female  Birth Weight: 7 lb 13 oz (3544 g) APGAR: 8, 9  Baby Feeding: Breast Disposition: pending   07/28/2015 Silvano Bilis, MD

## 2015-07-28 NOTE — Discharge Instructions (Signed)
Parto vaginal, Cuidados posteriores  °(Vaginal Delivery, Care After) °Siga estas instrucciones durante las próximas semanas. Estas indicaciones para el alta le proporcionan información general acerca de cómo deberá cuidarse después del parto. El médico también podrá darle instrucciones específicas. El tratamiento ha sido planificado según las prácticas médicas actuales, pero en algunos casos pueden ocurrir problemas. Comuníquese con el médico si tiene algún problema o tiene preguntas al volver a su casa.  °INSTRUCCIONES PARA EL CUIDADO EN EL HOGAR  °· Tome sólo medicamentos de venta libre o recetados, según las indicaciones del médico o del farmacéutico. °· No beba alcohol, especialmente si está amamantando o toma analgésicos. °· No mastique tabaco ni fume. °· No consuma drogas. °· Continúe con un adecuado cuidado perineal. El buen cuidado perineal incluye: °¨ Higienizarse de adelante hacia atrás. °¨ Mantener la zona perineal limpia. °· No use tampones ni duchas vaginales hasta que su médico la autorice. °· Dúchese, lávese el cabello y tome baños de inmersión según las indicaciones de su médico. °· Utilice un sostén que le ajuste bien y que brinde buen soporte a sus mamas. °· Consuma alimentos saludables. °· Beba suficiente líquido para mantener la orina clara o de color amarillo pálido. °· Consuma alimentos ricos en fibra como cereales y panes integrales, arroz, frijoles y frutas y verduras frescas todos los días. Estos alimentos pueden ayudarla a prevenir o aliviar el estreñimiento. °· Siga las recomendaciones de su médico relacionadas con la reanudación de actividades como subir escaleras, conducir automóviles, levantar objetos, hacer ejercicios o viajar. °· Hable con su médico acerca de reanudar la actividad sexual. Volver a la actividad sexual depende del riesgo de infección, la velocidad de la curación y la comodidad y su deseo de reanudarla. °· Trate de que alguien la ayude con las actividades del hogar y con  el recién nacido al menos durante un par de días después de salir del hospital. °· Descanse todo lo que pueda. Trate de descansar o tomar una siesta mientras el bebé está durmiendo. °· Aumente sus actividades gradualmente. °· Cumpla con todas las visitas de control programadas para después del parto. Es muy importante asistir a todas las citas programadas de seguimiento. En estas citas, su médico va a controlarla para asegurarse de que esté sanando física y emocionalmente. °SOLICITE ATENCIÓN MÉDICA SI:  °· Elimina coágulos grandes por la vagina. Guarde algunos coágulos para mostrarle al médico. °· Tiene una secreción con feo olor que proviene de la vagina. °· Tiene dificultad para orinar. °· Orina con frecuencia. °· Siente dolor al orinar. °· Nota un cambio en sus movimientos intestinales. °· Aumenta el enrojecimiento, el dolor o la hinchazón en la zona de la incisión vaginal (episiotomía) o el desgarro vaginal. °· Tiene pus que drena por la episiotomía o el desgarro vaginal. °· La episiotomía o el desgarro vaginal se abren. °· Sus mamas le duelen, están duras o enrojecidas. °· Sufre un dolor intenso de cabeza. °· Tiene visión borrosa o ve manchas. °· Se siente triste o deprimida. °· Tiene pensamientos acerca de lastimarse o dañar al recién nacido. °· Tiene preguntas acerca de su cuidado personal, el cuidado del recién nacido o acerca de los medicamentos. °· Se siente mareada o sufre un desmayo. °· Tiene una erupción. °· Tiene náuseas o vómitos. °· Usted amamantó al bebé y no ha tenido su período menstrual dentro de las 12 semanas después de dejar de amamantar. °· No amamanta al bebé y no tuvo su período menstrual en las últimas 12° semanas después del   parto. °· Tiene fiebre. °SOLICITE ATENCIÓN MÉDICA DE INMEDIATO SI:  °· Siente dolor persistente. °· Siente dolor en el pecho. °· Le falta el aire. °· Se desmaya. °· Siente dolor en la pierna. °· Siente dolor en el estómago. °· El sangrado vaginal satura dos o más  apósitos en 1 hora. °  °Esta información no tiene como fin reemplazar el consejo del médico. Asegúrese de hacerle al médico cualquier pregunta que tenga. °  °Document Released: 01/02/2005 Document Revised: 09/23/2014 °Elsevier Interactive Patient Education ©2016 Elsevier Inc. ° °

## 2015-08-16 ENCOUNTER — Telehealth: Payer: Self-pay | Admitting: General Practice

## 2015-08-16 NOTE — Telephone Encounter (Signed)
Per Dr Jolayne Panther, patient needs to PCP to manage her ear pain/infection. Called patient with [facility] ([place] for interpreter informing patient that we cannot refill her prescription for ear drops/infection because that was prescribed in the ER and that is not something that we can manage but she will need to contact [facility]. Patient verbalized understanding and patient asked for the mirena. Told patient we can do that at her postpartum visit and to not have unprotected sex. Reminded patient of pp visit appt. Patient verbalized understanding to all & had no questions

## 2015-08-17 ENCOUNTER — Emergency Department (HOSPITAL_COMMUNITY)
Admission: EM | Admit: 2015-08-17 | Discharge: 2015-08-17 | Disposition: A | Discharge status: Home / Self Care | Payer: Self-pay | Attending: Emergency Medicine | Admitting: Emergency Medicine

## 2015-08-17 ENCOUNTER — Encounter (HOSPITAL_COMMUNITY): Payer: Self-pay | Admitting: Emergency Medicine

## 2015-08-17 DIAGNOSIS — H6092 Unspecified otitis externa, left ear: Secondary | ICD-10-CM | POA: Insufficient documentation

## 2015-08-17 MED ORDER — AMOXICILLIN-POT CLAVULANATE 875-125 MG PO TABS: 1.0000 | ORAL_TABLET | Freq: Two times a day (BID) | ORAL | 0 refills | Status: DC

## 2015-08-17 NOTE — ED Notes (Signed)
PA at bedside.

## 2015-08-17 NOTE — ED Notes (Signed)
Patient able to ambulate independently  

## 2015-08-17 NOTE — Discharge Instructions (Signed)
Please read and follow all provided instructions.  Your diagnoses today include:  1. Otitis externa, left    Tests performed today include: Vital signs. See below for your results today.   Medications prescribed:  Take as prescribed   Home care instructions:  Follow any educational materials contained in this packet. DO NOT USE DROP ANYMORE. DO NOT SUBMERGE HEAD UNDERWATER  Follow-up instructions: Please follow-up with ENT for further evaluation of symptoms and treatment   Return instructions:  Please return to the Emergency Department if you do not get better, if you get worse, or new symptoms OR  - Fever (temperature greater than 101.61F)  - Bleeding that does not stop with holding pressure to the area    -Severe pain (please note that you may be more sore the day after your accident)  - Chest Pain  - Difficulty breathing  - Severe nausea or vomiting  - Inability to tolerate food and liquids  - Passing out  - Skin becoming red around your wounds  - Change in mental status (confusion or lethargy)  - New numbness or weakness    Please return if you have any other emergent concerns.  Additional Information:  Your vital signs today were: BP 122/88 (BP Location: Left Arm)    Pulse 75    Temp 98.1 F (36.7 C) (Oral)    Resp 16    Ht 5\' 3"  (1.6 m)    Wt 79.8 kg    SpO2 98%    BMI 31.18 kg/m  If your blood pressure (BP) was elevated above 135/85 this visit, please have this repeated by your doctor within one month. ---------------

## 2015-08-17 NOTE — ED Notes (Signed)
Irrigation of the left ear done with sterile water and hydrogen peroxide.  Some chunks of earwax removed.  Pt c/o pain with cleansing.  Will inform provider.

## 2015-08-17 NOTE — ED Provider Notes (Signed)
[facility]-EMERGENCY DEPT Provider Note   CSN: [id number] Arrival date & time: 08/17/15  2046  First Provider Contact:   First MD Initiated Contact with Patient 08/17/15 2115       By signing my name below, I, Teresa Orozco, attest that this documentation has been prepared under the direction and in the presence of Teresa Pili, PA-C Electronically Signed: Soijett Orozco, ED Scribe. 08/17/15. 9:25 PM.   History   Chief Complaint Chief Complaint  Patient presents with  . Otalgia    HPI Teresa Orozco is a 40 y.o. female who presents to the Emergency Department complaining of 5/10, constant, ear pain onset 3 months. Pt notes that ever since she delivered her baby 3 months ago, she has had ear pain. Denies any injury or trauma at time. Pt reports that she was seen for her symptoms 3 months ago and Rx ciprodex for her symptoms. Pt states that she has been using the drops that she was prescribed continuously for the past 3 months in 7 day increments. Pt is having associated symptoms of intermittent left ear drainage. She notes that she has tried ibuprofen for the relief of her symptoms. She denies fever, chills, color change, rash, wound, and any other symptoms.   Per pt chart review: Pt was seen in the ED on 05/26/2015 for left ear pain. Pt was Rx ciprodex for the relief of her symptoms.    The history is provided by the patient and the spouse. A language interpreter was used (husband and spanish).    History reviewed. No pertinent past medical history.  Patient Active Problem List   Diagnosis Date Noted  . Advanced maternal age in multigravida 07/26/2015  . NSVD (normal spontaneous vaginal delivery) 07/26/2015  . UTI (urinary tract infection) in pregnancy, antepartum 01/16/2015  . Supervision of high risk pregnancy, antepartum 01/14/2015  . AMA (advanced maternal age) multigravida 35+ 01/14/2015  . Eustachian tube dysfunction 02/16/2012    Past Surgical History:  Procedure Laterality Date  .  DILATION AND CURETTAGE OF UTERUS  2008    OB History    Gravida Para Term Preterm AB Living   8 5 5   3 5    SAB TAB Ectopic Multiple Live Births   3     0 5       Home Medications    Prior to Admission medications   Medication Sig Start Date End Date Taking? Authorizing Provider  acetaminophen (TYLENOL) 325 MG tablet Take 2 tablets (650 mg total) by mouth every 4 (four) hours as needed (for pain scale < 4). 07/28/15   Kathrynn Running, MD  ibuprofen (ADVIL,MOTRIN) 600 MG tablet Take 1 tablet (600 mg total) by mouth every 6 (six) hours. 07/28/15   Kathrynn Running, MD  Prenatal Multivit-Min-Fe-FA (PRENATAL VITAMINS) 0.8 MG tablet Take 1 tablet by mouth daily. 05/06/15   Marlis Edelson, CNM    Family History No family history on file.  Social History Social History  Substance Use Topics  . Smoking status: Never Smoker  . Smokeless tobacco: Never Used  . Alcohol use No     Allergies   Review of patient's allergies indicates no known allergies.   Review of Systems Review of Systems  Constitutional: Negative for chills and fever.  HENT: Positive for ear discharge and ear pain.   Skin: Negative for color change, rash and wound.     Physical Exam Updated Vital Signs BP 122/88 (BP Location: Left Arm)   Pulse 75  Temp 98.1 F (36.7 C) (Oral)   Resp 16   Ht  (1.6 m)   Wt 176 lb (79.8 kg)   SpO2 98%   BMI 31.18 kg/m   Physical Exam  Constitutional: She is oriented to person, place, and time. She appears well-developed and well-nourished. No distress.  HENT:  Head: Normocephalic and atraumatic.  Right Ear: Tympanic membrane, external ear and ear canal normal.  Left canal erythematous. Drainage noted with copious cerumen. No mastoid tenderness. Right ear nl.   Eyes: EOM are normal.  Neck: Neck supple.  Cardiovascular: Normal rate.   Pulmonary/Chest: Effort normal. No respiratory distress.  Abdominal: She exhibits no distension.  Musculoskeletal: Normal  range of motion.  Neurological: She is alert and oriented to person, place, and time.  Skin: Skin is warm and dry.  Psychiatric: She has a normal mood and affect. Her behavior is normal.  Nursing note and vitals reviewed.  ED Treatments / Results  DIAGNOSTIC STUDIES: Oxygen Saturation is 98% on RA, nl by my interpretation.    COORDINATION OF CARE: 9:25 PM Discussed treatment plan with pt at bedside which includes cerumen removal and pt agreed to plan.   Labs (all labs ordered are listed, but only abnormal results are displayed) Labs Reviewed - No data to display  EKG  EKG Interpretation None       Radiology No results found.  Procedures Procedures (including critical care time)  Medications Ordered in ED Medications - No data to display   Initial Impression / Assessment and Plan / ED Course  I have reviewed the triage vital signs and the nursing notes.  Pertinent labs & imaging results that were available during my care of the patient were reviewed by me and considered in my medical decision making (see chart for details).  Clinical Course    Final Clinical Impressions(s) / ED Diagnoses  I have reviewed the relevant previous healthcare records. I obtained HPI from historian.  ED Course:  Assessment: Pt is a 40yF who presents with left ear pain. Hx of same in May. Tx for Otitis Externa. On exam, pt in NAD. Nontoxic/nonseptic appearing. VSS. Afebrile. Left auditory canal erythematous. No mastoid tenderness. Copious cerumen noted. Irrigated in ED. Pt seen by supervising physician. Unlikely malignant OE. No hx DM. Given Rx Augmentin. Plan is to DC with follow up to ENT. At time of discharge, Patient is in no acute distress. Vital Signs are stable. Patient is able to ambulate. Patient able to tolerate PO.    Disposition/Plan:  DC Home Additional Verbal discharge instructions given and discussed with patient.  Pt Instructed to f/u with PCP in the next week for  evaluation and treatment of symptoms. Return precautions given Pt acknowledges and agrees with plan  Supervising Physician Margarita Grizzle, MD   Final diagnoses:  Otitis externa, left    New Prescriptions New Prescriptions   No medications on file   I personally performed the services described in this documentation, which was scribed in my presence. The recorded information has been reviewed and is accurate.     Teresa Pili, PA-C 08/17/15 2254    Margarita Grizzle, MD 08/19/15 2113

## 2015-08-17 NOTE — ED Notes (Signed)
MD at bedside. 

## 2015-08-17 NOTE — ED Triage Notes (Signed)
Pt. reports left ear ache for 12 months with intermittent drainage , no injury , denies fever or hearing loss.

## 2015-08-23 ENCOUNTER — Ambulatory Visit (INDEPENDENT_AMBULATORY_CARE_PROVIDER_SITE_OTHER): Payer: Self-pay | Admitting: Internal Medicine

## 2015-08-23 VITALS — BP 122/82 | HR 79 | Temp 98.2°F | Ht 65.0 in | Wt 173.0 lb

## 2015-08-23 DIAGNOSIS — H9202 Otalgia, left ear: Secondary | ICD-10-CM

## 2015-08-23 DIAGNOSIS — B368 Other specified superficial mycoses: Secondary | ICD-10-CM

## 2015-08-23 DIAGNOSIS — H628X2 Other disorders of left external ear in diseases classified elsewhere: Secondary | ICD-10-CM

## 2015-08-23 DIAGNOSIS — B49 Unspecified mycosis: Secondary | ICD-10-CM

## 2015-08-23 DIAGNOSIS — B369 Superficial mycosis, unspecified: Secondary | ICD-10-CM

## 2015-08-23 DIAGNOSIS — H6242 Otitis externa in other diseases classified elsewhere, left ear: Principal | ICD-10-CM

## 2015-08-23 MED ORDER — IBUPROFEN 600 MG PO TABS: 600.0000 mg | ORAL_TABLET | Freq: Four times a day (QID) | ORAL | 0 refills | Status: DC

## 2015-08-23 MED ORDER — FLUCONAZOLE 150 MG PO TABS: ORAL_TABLET | ORAL | 0 refills | Status: DC

## 2015-08-23 MED ORDER — FLUCONAZOLE 100 MG PO TABS: ORAL_TABLET | ORAL | 0 refills | Status: DC

## 2015-08-23 NOTE — Progress Notes (Signed)
[facility] Progress Note  Subjective:  Teresa Orozco is a 40-y/o female with left ear pain. Visit assisted by Spanish Video Interpreter Jasmine DecemberSharon [phone](#[phone]).   L Ear Pain: - Started about 3 weeks ago. Comes and goes. Makes it difficult to sleep.  - Was seen in ED 08/17/15 and given augmentin, which patient says has not helped ear pain and makes her stomach hurt - Denies trauma to ear or recent swimming - Denies trouble hearing or ringing of ears - Occasional, scant drainage - Of note, was seen in May for ear pain and treated with ciprodex drops - Ibuprofen helps pain ROS: No fevers, no decreased appetite, no HA   No Known Allergies  Objective: Blood pressure 122/82, pulse 79, temperature 98.2 F (36.8 C), temperature source Oral, height 5\' 5"  (1.651 m), weight 173 lb (78.5 kg), SpO2 100 %, currently breastfeeding. Constitutional: Well-appearing female, in NAD HENT: Right ear canal and TM normal; left ear canal erythematous and filled with thick greyish, white debris with off-white discharge, minimal narrowing towards ear canal after manual debridement with TM intact. No frontal or maxillary tenderness to percussion. No mastoid tenderness.  Lymph: No postauricular, submandibular or cervical lymphadenopathy.  Cardiovascular: RRR, S1, S2, no m/r/g.  Neurological: AOx3, no focal deficits Skin: Skin is warm and dry. No rash noted.  Vitals reviewed  Assessment/Plan: Otomycosis of left ear - Will treat with PO diflucan x 5 days, given duration of symptoms, and topical clotrimazole until follow-up  - Would refer to ENT if has worsening pain, lack of resolution, or evidence of TM perforation - No history of diabetes or fevers to suggest malignant otitis externa - If ear free of debris upon follow-up may benefit from continuing clotrimazole drops for another 2 weeks to prevent recurrence  Follow-up in about 10 days to look for resolution.  Dani GobbleHillary Fitzgerald, MD [facility], PGY-2

## 2015-08-23 NOTE — Patient Instructions (Signed)
Ms. Teresa Orozco,  You have an ear infection with fungus. Please take diflucan for the next 5 days (tablets). Please use ear ointment 2-3 drops in your left ear 3 times daily for the next 10 days. Please make an appointment to see us back in clinic in about 10 days.   Best, Dr. Hilda BladesFitzgerald  [name],  Usted tiene una infeccin del odo con hongos. Por favor tome diflucan durante los prximos 5 das (comprimidos). Por favor, use pomada de odo 2-3 gotas en la oreja izquierda 3 veces al [name] durante los prximos [address] das. Por favor, haga una cita para vernos de vuelta en la clnica en unos 10 das.  Mejor, Dr. Sampson GoonFitzgerald

## 2015-08-25 ENCOUNTER — Encounter: Payer: Self-pay | Admitting: Internal Medicine

## 2015-08-25 DIAGNOSIS — H6242 Otitis externa in other diseases classified elsewhere, left ear: Principal | ICD-10-CM

## 2015-08-25 DIAGNOSIS — B369 Superficial mycosis, unspecified: Secondary | ICD-10-CM | POA: Insufficient documentation

## 2015-08-25 NOTE — Assessment & Plan Note (Addendum)
-   Will treat with PO diflucan x 5 days, given duration of symptoms, and topical clotrimazole until follow-up  - Would refer to ENT if has worsening pain, lack of resolution, or evidence of TM perforation - No history of diabetes or fevers to suggest malignant otitis externa - If ear free of debris upon follow-up may benefit from continuing clotrimazole drops for another 2 weeks to prevent recurrence

## 2015-09-13 ENCOUNTER — Encounter: Payer: Self-pay | Admitting: Obstetrics and Gynecology

## 2015-09-13 ENCOUNTER — Ambulatory Visit (INDEPENDENT_AMBULATORY_CARE_PROVIDER_SITE_OTHER): Payer: Self-pay | Admitting: Obstetrics and Gynecology

## 2015-09-13 LAB — POCT PREGNANCY, URINE: Preg Test, Ur: NEGATIVE

## 2015-09-13 NOTE — Progress Notes (Addendum)
Postpartum Visiti 09/13/2015 Clinic: [facility]  Subjective:     Teresa Orozco is a 40 y.o. [id number] with the above CC. Preg c/b AMA  She is s/p 7/10 SVD/2nd degree. D/c to home on PPD#2  LMP: none. Breastfeeding 2x/day, +intercourse (last a few days ago and no issues), no issues with voiding or BMs, no discharge and mood is good. Interested in LNG IUD  Pap and HPV negative 2016  Review of Systems Pertinent items are noted in HPI.   Objective:    NAD  Labs: UPT negative Assessment:   Patient doing well  Plan:  Pt told to abstain until Liletta in two weeks  Pt working on PP depression screen and review with RN  RTC 2wks for UPT and [employer]. [employer]. PenneyLiletta Interpreter used  Cornelia Copaharlie Pickens, Jr MD Attending [facility] for [facility](Faculty Practice)

## 2016-03-09 IMAGING — US US MFM OB DETAIL+14 WK
1 series · 14 of 28 positions shown · non-contrast
Comparison: none

[Series 1: us mfm ob detail+14 wk · 74 acquisitions, 14 frames shown]
[im 3/74]
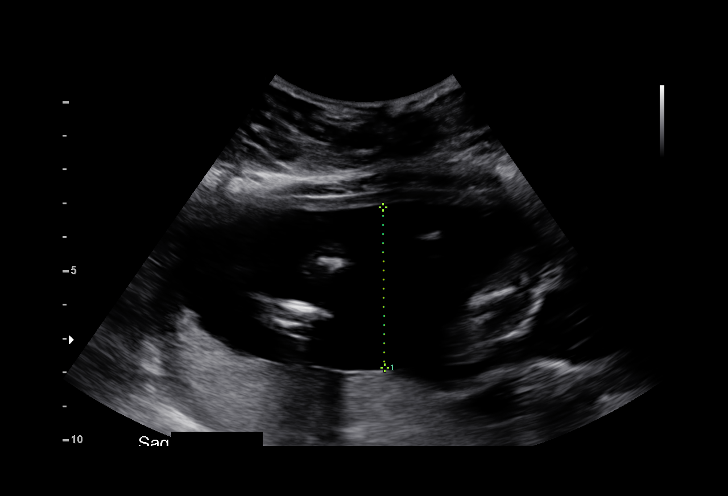
[im 9/74]
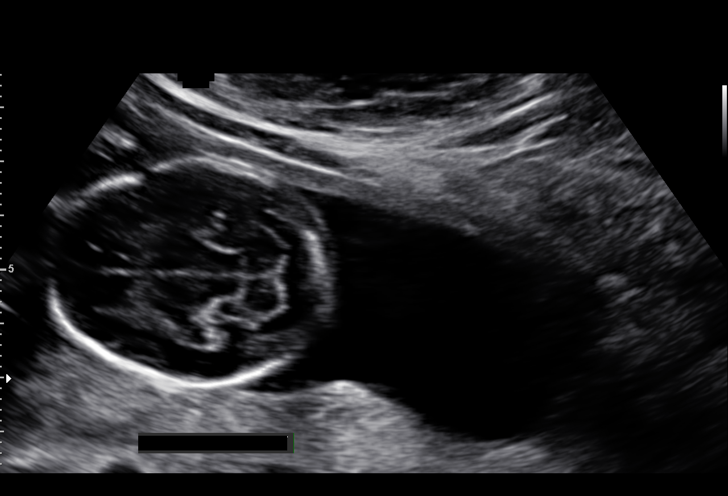
[im 14/74]
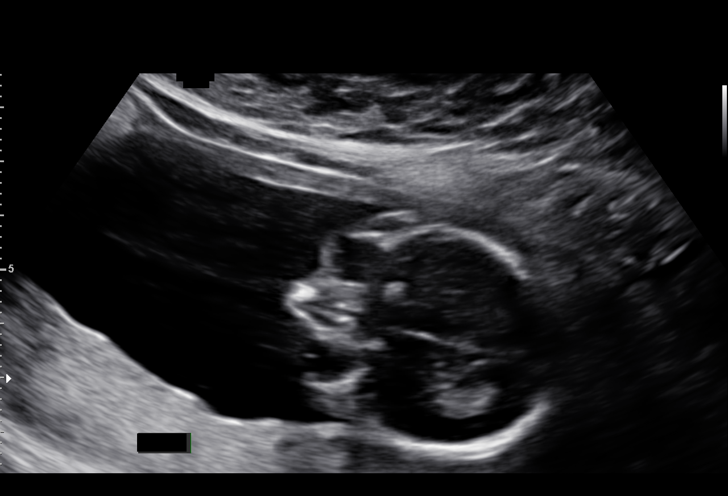
[im 19/74]
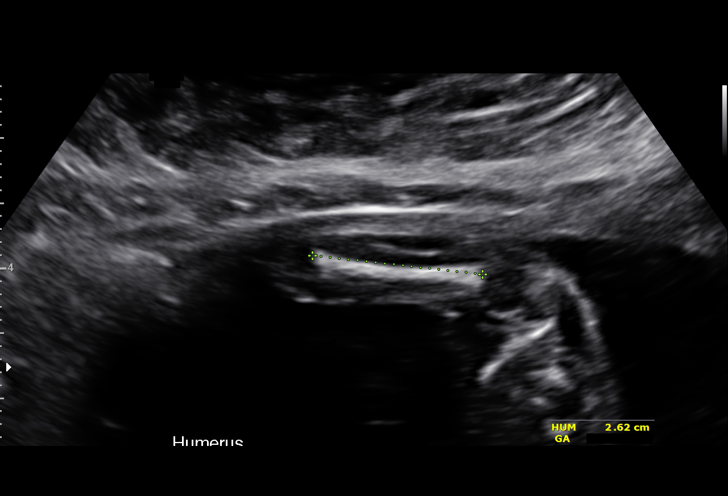
[im 25/74]
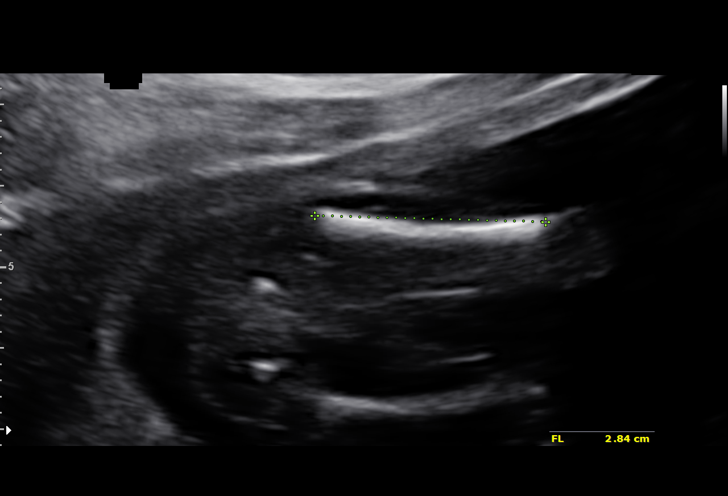
[im 30/74]
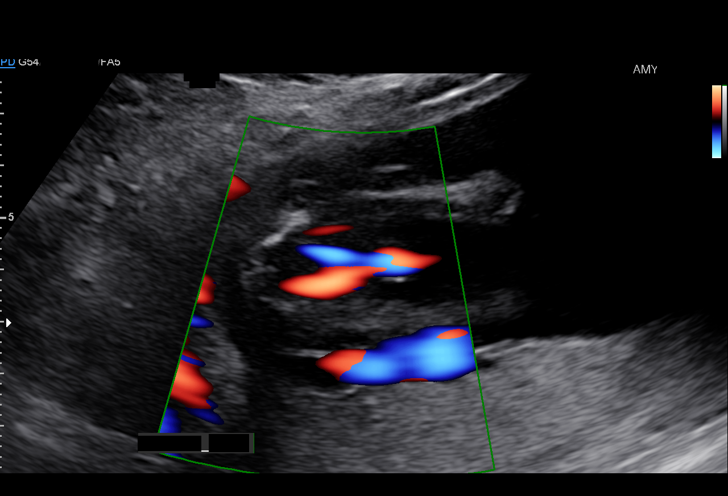
[im 36/74]
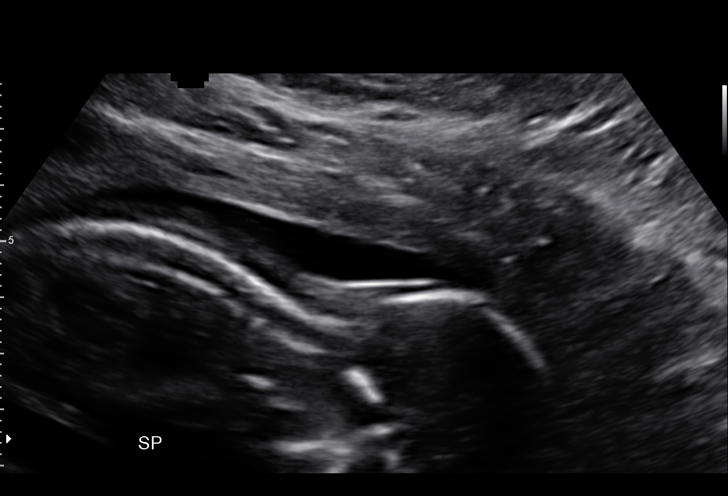
[im 41/74]
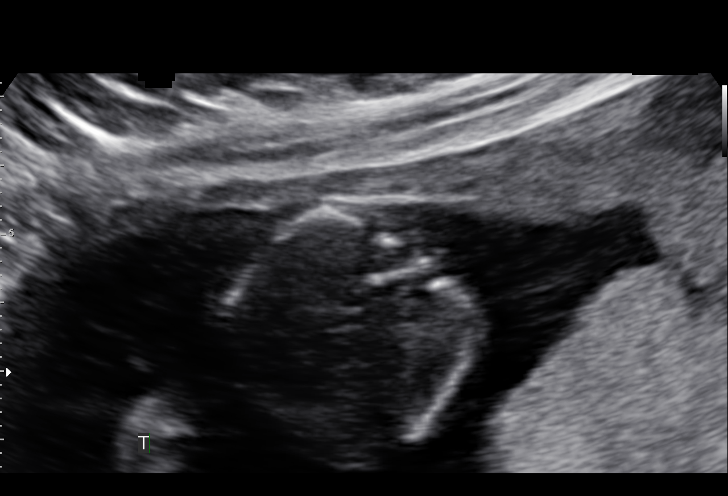
[im 46/74]
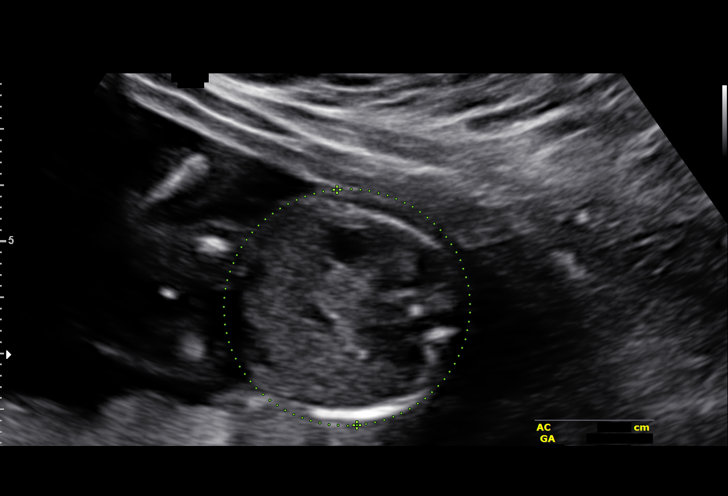
[im 52/74]
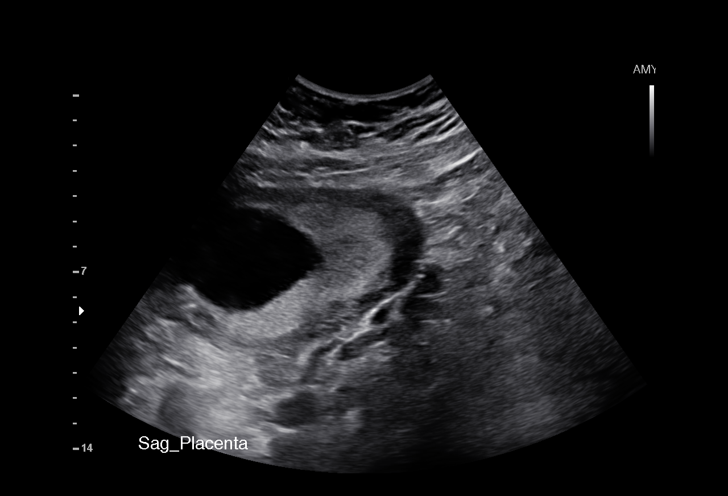
[im 57/74]
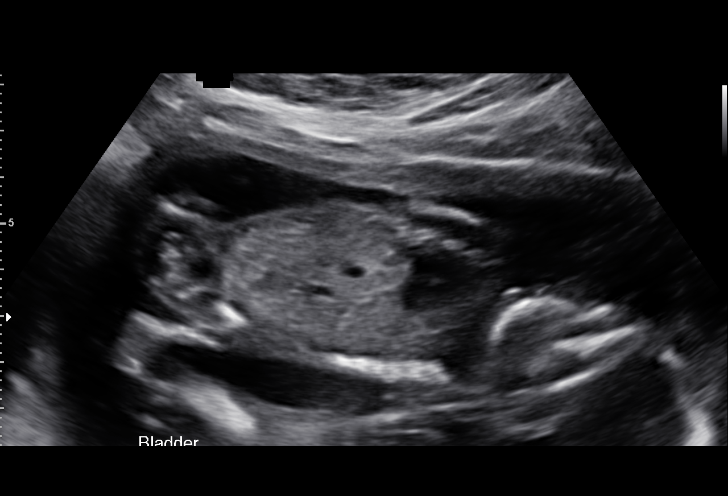
[im 63/74]
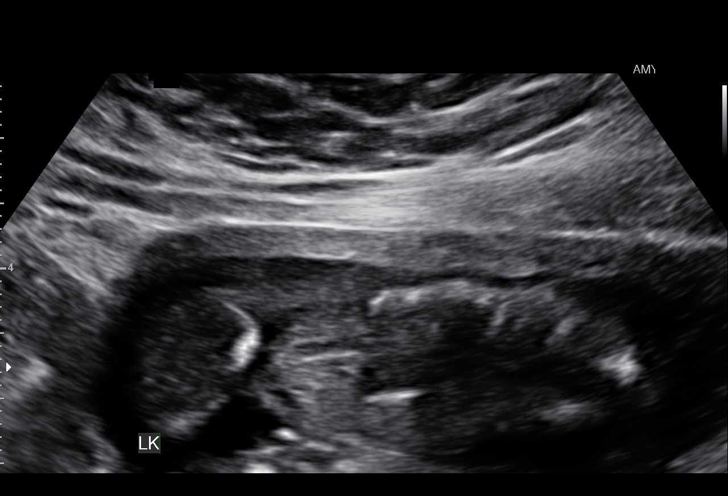
[im 68/74]
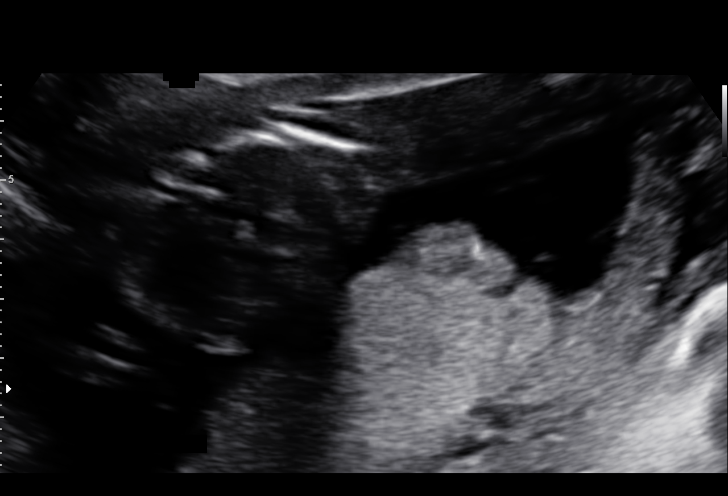
[im 74/74]
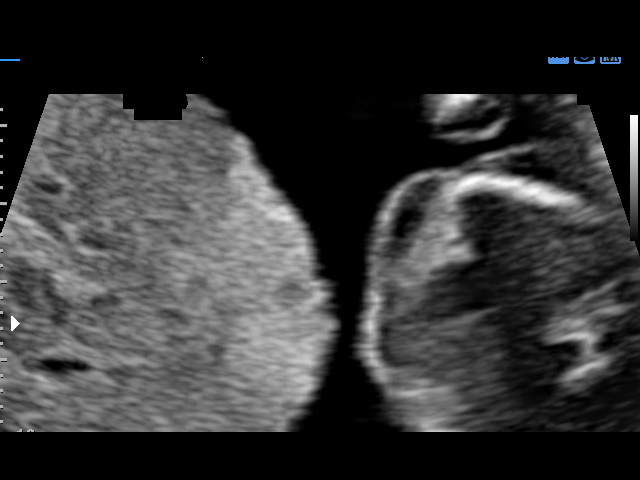

[14 of 28 positions shown; findings below may reference images not displayed]

am)

1  KIMIYASU ARVIN            [id number]      [id number]     [id number]
Indications

Advanced maternal age multigravida (40);
second trimester
18 weeks gestation of pregnancy
Detailed fetal anatomic survey                 Z36
OB History

Gravidity:    8         Term:   4        Prem:   0        SAB:   3
TOP:          0       Ectopic:  0        Living: 4
Fetal Evaluation

Num Of Fetuses:     1
Fetal Heart         142
Rate(bpm):
Cardiac Activity:   Observed
Presentation:       Variable
Placenta:           Posterior RT, low-lying, 1.3cm from os
P. Cord Insertion:  Visualized

Amniotic Fluid
AFI FV:      Subjectively within normal limits
Larg Pckt:     4.7  cm
Biometry

BPD:      40.3  mm     G. Age:  18w 1d                  CI:        70.28   %    70 - 86
FL/HC:      18.4   %    16.1 -
HC:      153.3  mm     G. Age:  18w 2d         30  %    HC/AC:      1.16        1.09 -
AC:      132.1  mm     G. Age:  18w 5d         51  %    FL/BPD:     70.0   %
FL:       28.2  mm     G. Age:  18w 5d         47  %    FL/AC:      21.3   %    20 - 24
HUM:      26.2  mm     G. Age:  18w 1d         44  %
CER:      19.2  mm     G. Age:  18w 4d         51  %
NFT:       2.9  mm
LV:        5.1  mm
CM:        4.9  mm

Est. FW:     250  gm      0 lb 9 oz     47  %
Gestational Age

U/S Today:     18w 3d                                        EDD:   07/27/15
Best:          18w 4d     Det. By:  U/S  (01/27/15)          EDD:   07/26/15
Anatomy

Cranium:          Appears normal         LVOT:             Appears normal
Fetal Cavum:      Appears normal         Aortic Arch:      Appears normal
Ventricles:       Appears normal         Ductal Arch:      Not well visualized
Choroid Plexus:   Appears normal         Diaphragm:        Appears normal
Cerebellum:       Appears normal         Stomach:          Appears normal, left
sided
Posterior Fossa:  Appears normal         Abdomen:          Appears normal
Nuchal Fold:      Appears normal         Abdominal Wall:   Appears nml (cord
insert, abd wall)
Face:             Orbits appear          Cord Vessels:     Appears normal (3
normal                                   vessel cord)
Lips:             Appears normal         Kidneys:          Appear normal
Palate:           Appears normal         Bladder:          Appears normal
Fetal Thoracic:   Appears normal         Spine:            Appears normal
Heart:            Appears normal         Upper             Appears normal
(4CH, axis, and        Extremities:
situs)
RVOT:             Not well visualized    Lower             Appears normal
Extremities:

Other:  Fetus appears to be a female. Heels appear normal.
Cervix Uterus Adnexa

Cervix
Normal appearance by transabdominal scan. Appears closed, without
funnelling.

Left Ovary
Not visualized. No adnexal mass visualized.

Right Ovary
Not visualized. No adnexal mass visualized.
Impression

Single IUP at 18w 4d
Somewhat limited views of the fetal heart were obtained
(RVOT, ductal arch)
The remainder of the fetal anatomy appears normal
No markers associated with aneuploidy were appreciated
Posterior, low lying placenta (1.3 cm from the internal os)
Normal amniotic fluid volume

The patient declined Genetic counseling
Recommendations

Recommend follow-up ultrasound examination in 4 weeks to
complete anatomy - will reevaluate the placenta at that time
Recommend ultrasound in the 3rd trimester (
30 weeks) for
growth due to AMA
Antenatal testing beginning no later than 36 weeks

## 2017-01-02 ENCOUNTER — Ambulatory Visit: Payer: Self-pay

## 2017-01-02 DIAGNOSIS — Z3201 Encounter for pregnancy test, result positive: Secondary | ICD-10-CM

## 2017-01-02 DIAGNOSIS — Z3A08 8 weeks gestation of pregnancy: Secondary | ICD-10-CM

## 2017-01-02 LAB — POCT PREGNANCY, URINE: Preg Test, Ur: POSITIVE — AB

## 2017-01-02 MED ORDER — PRENATAL VITAMINS 0.8 MG PO TABS
1.0000 | ORAL_TABLET | Freq: Every day | ORAL | 12 refills | Status: DC
Start: 2017-01-02 — End: 2017-01-23

## 2017-01-02 NOTE — Progress Notes (Signed)
Patient presented to office for UPT positive. Pt.currently 8w 3d,EDD:08/11/17 by LMP. Pt.denies taking any medicine or vitamins, pt. req RX for prenatal vitamins. Sent per protocol. Encourage pt. To start OB care, pt.verbalized understanding no questions. [employer] interpreter Mathis Farelbert ID# [id number] used for visit.

## 2017-01-16 NOTE — L&D Delivery Note (Signed)
Delivery Note Teresa Orozco is a 42 y.o. [id number] at 2727w1d admitted for eCheron Schaumannlective IOL.  Labor course: pit/arom At 2039 a viable female was delivered via spontaneous vaginal delivery (Presentation: ROA).  Vigorous infant placed directly on mom's abdomen for bonding/skin-to-skin. Delayed cord clamping x 1min, then cord clamped x 2, and cut by FOB.  APGAR: refer to delivery summary; weight: pending at time of note.  40 units of pitocin diluted in 1000cc LR was infused rapidly IV per protocol. The placenta separated spontaneously and delivered via CCT and maternal pushing effort.  It was inspected and appears to be intact with a 3 VC.  Placenta/Cord with the following complications: none .  Cord pH: not done Brisk bleeding, fundus boggy, firms up w/ massage, gave cytotec 800mcg pr w/ good results. FF, minimal lochia.   Intrapartum complications:  None Anesthesia:  epidural Episiotomy: none Lacerations:  none Suture Repair: n/a Est. Blood Loss (mL): 350 Sponge and instrument count were correct x2.  Mom to postpartum.  Baby to Couplet care / Skin to Skin. Placenta to L&D. Plans to bottlefeed Contraception: Mirena Circ: no  Teresa Orozco CNM, [facility]NP-BC 08/14/2017 9:01 PM   Teresa Orozco, Teresa Orozco, CNM  P [facility] Admin Pool        Please schedule this patient for PP visit in: 6 weeks  High risk pregnancy complicated by: AMA  Delivery mode: SVD  Anticipated Birth Control: IUD  PP Procedures needed: none, will need pap in Dec  Schedule Integrated BH visit: no  Provider: Any provider

## 2017-01-23 ENCOUNTER — Telehealth: Payer: Self-pay

## 2017-01-23 ENCOUNTER — Encounter: Payer: Self-pay | Admitting: Obstetrics and Gynecology

## 2017-01-23 ENCOUNTER — Ambulatory Visit (INDEPENDENT_AMBULATORY_CARE_PROVIDER_SITE_OTHER): Payer: Self-pay | Admitting: Obstetrics and Gynecology

## 2017-01-23 VITALS — BP 140/64 | HR 90 | Wt 171.3 lb

## 2017-01-23 DIAGNOSIS — O09521 Supervision of elderly multigravida, first trimester: Secondary | ICD-10-CM

## 2017-01-23 DIAGNOSIS — Z3A08 8 weeks gestation of pregnancy: Secondary | ICD-10-CM

## 2017-01-23 DIAGNOSIS — O0991 Supervision of high risk pregnancy, unspecified, first trimester: Secondary | ICD-10-CM

## 2017-01-23 LAB — POCT URINALYSIS DIP (DEVICE)
BILIRUBIN URINE: NEGATIVE
Glucose, UA: NEGATIVE mg/dL
KETONES UR: NEGATIVE mg/dL
Nitrite: NEGATIVE
PH: 7 (ref 5.0–8.0)
Protein, ur: NEGATIVE mg/dL
Specific Gravity, Urine: 1.02 (ref 1.005–1.030)
Urobilinogen, UA: 0.2 mg/dL (ref 0.0–1.0)

## 2017-01-23 MED ORDER — ASPIRIN EC 81 MG PO TBEC: 81.0000 mg | DELAYED_RELEASE_TABLET | Freq: Every day | ORAL | 2 refills | Status: DC

## 2017-01-23 MED ORDER — PRENATAL VITAMINS 0.8 MG PO TABS: 1.0000 | ORAL_TABLET | Freq: Every day | ORAL | 12 refills | Status: DC

## 2017-01-23 NOTE — Patient Instructions (Signed)
Primer trimestre de embarazo °(First Trimester of Pregnancy) °El primer trimestre de embarazo se extiende desde la semana 1 hasta el final de la semana 12 (mes 1 al mes 3). Durante este tiempo, el bebé comenzará a desarrollarse dentro suyo. Entre la semana 6 y la 8, se forman los ojos y el rostro, y los latidos del corazón pueden verse en la ecografía. Al final de las 12 semanas, todos los órganos del bebé están formados. La atención prenatal es toda la asistencia médica que usted recibe antes del nacimiento del bebé. Asegúrese de recibir una buena atención prenatal y de seguir todas las indicaciones del médico. °CUIDADOS EN EL HOGAR °Medicamentos: °· Tome los medicamentos solamente como se lo haya indicado el médico. Algunos medicamentos se pueden tomar durante el embarazo y otros no. °· Tome las vitaminas prenatales como se lo haya indicado el médico. °· Tome el medicamento que la ayuda a defecar (laxante suave) según sea necesario, si el médico lo autoriza. °Dieta °· Ingiera alimentos saludables de manera regular. °· El médico le indicará la cantidad de peso que puede aumentar. °· No coma carne cruda ni quesos sin cocinar. °· Si tiene malestar estomacal (náuseas) o vomita: °? Ingiera 4 o 5 comidas pequeñas por día en lugar de 3 abundantes. °? Intente comer algunas galletitas saladas. °? Beba líquidos entre las comidas, en lugar de hacerlo durante estas. °· Si tiene dificultad para defecar (estreñimiento): °? Consuma alimentos con alto contenido de fibra, como verduras y frutas frescos, y cereales integrales. °? Beba suficiente líquido para mantener el pis (orina) claro o de color amarillo pálido. °Actividad y ejercicios °· Haga ejercicios solamente como se lo haya indicado el médico. Deje de hacer ejercicios si tiene cólicos o dolor en la parte baja del vientre (abdomen) o en la cintura. °· Intente no estar de pie durante mucho tiempo. Mueva las piernas con frecuencia si debe estar de pie en un lugar durante  mucho tiempo. °· Evite levantar pesos excesivos. °· Use zapatos con tacones bajos. Mantenga una buena postura al sentarse y pararse. °· Puede tener relaciones sexuales, a menos que el médico le indique lo contrario. °Alivio del dolor o las molestias °· Use un sostén que le brinde buen soporte si le duelen las mamas. °· Dese baños con agua tibia (baños de asiento) para aliviar el dolor o las molestias a causa de las hemorroides. Use crema antihemorroidal si el médico se lo permite. °· Descanse con las piernas elevadas si tiene calambres o dolor de cintura. °· Use medias de descanso si tiene las venas de las piernas hinchadas y abultadas (venas varicosas). Eleve los pies durante 15 minutos, 3 o 4 veces por día. Limite la cantidad de sal en su dieta. °Cuidados prenatales °· Programe las visitas prenatales para la semana 12 de embarazo. °· Escriba sus preguntas. Llévelas cuando concurra a las visitas prenatales. °· Concurra a todas las visitas prenatales como se lo haya indicado el médico. °Seguridad °· Colóquese el cinturón de seguridad cuando conduzca. °· Haga una lista con los números de teléfono en caso de emergencia, en la cual deben incluirse los números de los familiares, los amigos, el hospital y los departamentos de policía y de bomberos. °Consejos generales °· Pídale al médico que la derive a clases prenatales en su localidad. Debe comenzar a tomar las clases antes de entrar en el mes 6 de embarazo. °· Pida ayuda si necesita asesoramiento o asistencia con la alimentación. El médico puede aconsejarla o indicarle dónde recurrir para recibir ayuda. °· No se dé baños de inmersión en agua caliente, baños   turcos ni saunas. °· No se haga duchas vaginales ni use tampones o toallas higiénicas perfumadas. °· No mantenga las piernas cruzadas durante mucho tiempo. °· Evite el contacto con las bandejas sanitarias de los gatos y la tierra que estos animales usan. °· No fume, no consuma hierbas ni beba alcohol. No tome  fármacos que el médico no haya autorizado. °· No consuma ningún producto que contenga tabaco, lo que incluye cigarrillos, tabaco de mascar o cigarrillos electrónicos. Si necesita ayuda para dejar de fumar, consulte al médico. Puede recibir asesoramiento u otro tipo de apoyo para dejar de fumar. °· Visite al dentista. En su casa, lávese los dientes con un cepillo dental suave. Pásese el hilo dental con suavidad. °SOLICITE AYUDA SI: °· Tiene mareos. °· Tiene cólicos leves o siente presión en la parte baja del vientre. °· Siente un dolor persistente en la zona del vientre. °· Sigue teniendo malestar estomacal, vomita o las heces son líquidas (diarrea). °· Observa una secreción, con mal olor que proviene de la vagina. °· Siente dolor al orinar. °· Tiene el rostro, las manos, las piernas o los tobillos más hinchados (inflamados). ° °SOLICITE AYUDA DE INMEDIATO SI: °· Tiene fiebre. °· Tiene una pérdida de líquido por la vagina. °· Tiene sangrado o pequeñas pérdidas vaginales. °· Tiene cólicos o dolor muy intensos en el vientre. °· Sube o baja de peso rápidamente. °· Vomita sangre. Puede ser similar a la borra del café °· Está en contacto con personas que tienen rubéola, la quinta enfermedad o varicela. °· Siente un dolor de cabeza muy intenso. °· Le falta el aire. °· Sufre cualquier tipo de traumatismo, por ejemplo, debido a una caída o un accidente automovilístico. ° °Esta información no tiene como fin reemplazar el consejo del médico. Asegúrese de hacerle al médico cualquier pregunta que tenga. °Document Released: 03/31/2008 Document Revised: 01/23/2014 Document Reviewed: 11/12/2012 °Elsevier Interactive Patient Education © 2017 Elsevier Inc. ° °

## 2017-01-23 NOTE — Telephone Encounter (Signed)
Called pt to advise of  US appt 02/01/17@10am  [facility] with [facility] [employer] Myrene BuddyYvonne id# [phone]- no answer left VM requesting for pt to call back regarding appt.date.

## 2017-01-23 NOTE — Progress Notes (Signed)
Subjective:  Teresa Orozco is a 42 y.o. [id number] at 32w3dbeing seen today for first OB visit.   [name] and has Eustachian tube dysfunction; Pregnancy, supervision, high-risk; Advanced maternal age in multigravida; Otomycosis of left ear; and Supervision of high risk pregnancy, antepartum, first trimester on their problem list.  Patient reports no complaints.  Contractions: Not present. Vag. Bleeding: None.  Movement: Absent. Denies leaking of fluid.   The following portions of the patient's history were reviewed and updated as appropriate: allergies, current medications, past family history, past medical history, past social history, past surgical history and problem list. Problem list updated.  Objective:   Vitals:   01/23/17 0906  BP: 140/64  Pulse: 90  Weight: 77.7 kg (171 lb 4.8 oz)    Fetal Status: Fetal Heart Rate (bpm): 142   Movement: Absent     General:  Alert, oriented and cooperative. Patient is in no acute distress.  Skin: Skin is warm and dry. No rash noted.   Cardiovascular: Normal heart rate noted  Respiratory: Normal respiratory effort, no problems with respiration noted  Abdomen: Soft, gravid, appropriate for gestational age. Pain/Pressure: Absent     Pelvic:  Cervical exam deferred        Extremities: Normal range of motion.  Edema: None  Mental Status: Normal mood and affect. Normal behavior. Normal judgment and thought content.   Urinalysis:      Assessment and Plan:  Pregnancy: [id number] 173w3d1. Supervision of high risk pregnancy in first trimester Prenatal care and labs reviewed with pt. Will refer to [facility] for U/S d/t uncertain LMP Pap smear 12/16 normal  - Obstetric Panel, Including HIV - Culture, OB Urine - SMN1 COPY NUMBER ANALYSIS (SMA Carrier Screen) - Comp Met (CMET) - TSH  2. Elderly multigravida in first trimester AMA reviewed with pt. Declines genetics  -  Hemoglobin A1c - aspirin EC 81 MG tablet; Take 1 tablet (81 mg total) by mouth daily. Take after 12 weeks for prevention of preeclampsia later in pregnancy  Dispense: 300 tablet; Refill: 2  3. Supervision of high risk pregnancy, antepartum, first trimester As above  Preterm labor symptoms and general obstetric precautions including but not limited to vaginal bleeding, contractions, leaking of fluid and fetal movement were reviewed in detail with the patient. Please refer to After Visit Summary for other counseling recommendations.  No Follow-up on file.   ErChancy MilroyMD

## 2017-01-24 LAB — COMPREHENSIVE METABOLIC PANEL
ALT: 12 IU/L (ref 0–32)
AST: 13 IU/L (ref 0–40)
Albumin/Globulin Ratio: 1.4 (ref 1.2–2.2)
Albumin: 4.3 g/dL (ref 3.5–5.5)
Alkaline Phosphatase: 64 IU/L (ref 39–117)
BUN/Creatinine Ratio: 11 (ref 9–23)
BUN: 6 mg/dL (ref 6–24)
Bilirubin Total: 0.5 mg/dL (ref 0.0–1.2)
CALCIUM: 9.1 mg/dL (ref 8.7–10.2)
CHLORIDE: 103 mmol/L (ref 96–106)
CO2: 20 mmol/L (ref 20–29)
Creatinine, Ser: 0.55 mg/dL — ABNORMAL LOW (ref 0.57–1.00)
GFR calc non Af Amer: 116 mL/min/{1.73_m2} (ref >59)
GFR, EST AFRICAN AMERICAN: 134 mL/min/{1.73_m2} (ref >59)
Globulin, Total: 3 g/dL (ref 1.5–4.5)
Glucose: 85 mg/dL (ref 65–99)
Potassium: 4.1 mmol/L (ref 3.5–5.2)
Sodium: 138 mmol/L (ref 134–144)
TOTAL PROTEIN: 7.3 g/dL (ref 6.0–8.5)

## 2017-01-24 LAB — OBSTETRIC PANEL, INCLUDING HIV
Antibody Screen: NEGATIVE
BASOS ABS: 0 10*3/uL (ref 0.0–0.2)
Basos: 0 %
EOS (ABSOLUTE): 0.1 10*3/uL (ref 0.0–0.4)
Eos: 1 %
HEMOGLOBIN: 12.7 g/dL (ref 11.1–15.9)
HIV Screen 4th Generation wRfx: NONREACTIVE
Hematocrit: 39.3 % (ref 34.0–46.6)
Hepatitis B Surface Ag: NEGATIVE
IMMATURE GRANS (ABS): 0 10*3/uL (ref 0.0–0.1)
IMMATURE GRANULOCYTES: 0 %
LYMPHS: 19 %
Lymphocytes Absolute: 1.8 10*3/uL (ref 0.7–3.1)
MCH: 30 pg (ref 26.6–33.0)
MCHC: 32.3 g/dL (ref 31.5–35.7)
MCV: 93 fL (ref 79–97)
MONOS ABS: 0.5 10*3/uL (ref 0.1–0.9)
Monocytes: 5 %
NEUTROS PCT: 75 %
Neutrophils Absolute: 7 10*3/uL (ref 1.4–7.0)
Platelets: 367 10*3/uL (ref 150–379)
RBC: 4.24 x10E6/uL (ref 3.77–5.28)
RDW: 15.8 % — AB (ref 12.3–15.4)
RPR Ser Ql: NONREACTIVE
Rh Factor: POSITIVE
Rubella Antibodies, IGG: 2.49 index (ref >0.99)
WBC: 9.3 10*3/uL (ref 3.4–10.8)

## 2017-01-24 LAB — HEMOGLOBIN A1C
ESTIMATED AVERAGE GLUCOSE: 108 mg/dL
Hgb A1c MFr Bld: 5.4 % (ref 4.8–5.6)

## 2017-01-24 LAB — TSH: TSH: 1.24 u[IU]/mL (ref 0.450–4.500)

## 2017-01-26 LAB — CULTURE, OB URINE

## 2017-01-26 LAB — URINE CULTURE, OB REFLEX

## 2017-01-26 NOTE — Telephone Encounter (Signed)
Called pt again to advise of US appt.Interpreter @ [employer] Tania [phone]#[phone] left message for her to call us back.

## 2017-01-29 LAB — SMN1 COPY NUMBER ANALYSIS (SMA CARRIER SCREENING)

## 2017-02-22 ENCOUNTER — Ambulatory Visit (INDEPENDENT_AMBULATORY_CARE_PROVIDER_SITE_OTHER): Payer: Self-pay | Admitting: Obstetrics and Gynecology

## 2017-02-22 ENCOUNTER — Encounter: Payer: Self-pay | Admitting: Obstetrics and Gynecology

## 2017-02-22 VITALS — BP 126/68 | HR 97 | Wt 173.2 lb

## 2017-02-22 DIAGNOSIS — O09522 Supervision of elderly multigravida, second trimester: Secondary | ICD-10-CM

## 2017-02-22 DIAGNOSIS — Z113 Encounter for screening for infections with a predominantly sexual mode of transmission: Secondary | ICD-10-CM

## 2017-02-22 DIAGNOSIS — N898 Other specified noninflammatory disorders of vagina: Secondary | ICD-10-CM

## 2017-02-22 DIAGNOSIS — O0992 Supervision of high risk pregnancy, unspecified, second trimester: Secondary | ICD-10-CM

## 2017-02-22 LAB — POCT URINALYSIS DIP (DEVICE)
Bilirubin Urine: NEGATIVE
Glucose, UA: NEGATIVE mg/dL
KETONES UR: NEGATIVE mg/dL
Nitrite: NEGATIVE
PH: 7 (ref 5.0–8.0)
PROTEIN: NEGATIVE mg/dL
SPECIFIC GRAVITY, URINE: 1.02 (ref 1.005–1.030)
Urobilinogen, UA: 1 mg/dL (ref 0.0–1.0)

## 2017-02-22 NOTE — Progress Notes (Signed)
Subjective:  Teresa Orozco is a 42 y.o. [id number] at 2420w5d being seen today for ongoing prenatal care.  [name] and has Pregnancy, supervision, high-risk and Advanced maternal age in multigravida on their problem list.  Patient reports no complaints.  Contractions: Not present. Vag. Bleeding: None.  Movement: (!) Decreased. Denies leaking of fluid.   The following portions of the patient's history were reviewed and updated as appropriate: allergies, current medications, past family history, past medical history, past social history, past surgical history and problem list. Problem list updated.  Objective:   Vitals:   02/22/17 1331  BP: 126/68  Pulse: 97  Weight: 173 lb 3.2 oz (78.6 kg)    Fetal Status: Fetal Heart Rate (bpm): 136   Movement: (!) Decreased     General:  Alert, oriented and cooperative. Patient is in no acute distress.  Skin: Skin is warm and dry. No rash noted.   Cardiovascular: Normal heart rate noted  Respiratory: Normal respiratory effort, no problems with respiration noted  Abdomen: Soft, gravid, appropriate for gestational age. Pain/Pressure: Absent     Pelvic:  Cervical exam deferred        Extremities: Normal range of motion.  Edema: None  Mental Status: Normal mood and affect. Normal behavior. Normal judgment and thought content.   Urinalysis: Urine Protein: Negative Urine Glucose: Negative  Assessment and Plan:  Pregnancy: [id number] at 3920w5d  1. Elderly multigravida in second trimester Declines  2. Supervision of high risk pregnancy in second trimester Stable Continue with PNV/BASA qd - US MFM OB DETAIL +14 WK; Future  3. Vaginal odor Self swab - Cervicovaginal ancillary only  Preterm labor symptoms and general obstetric precautions including but not limited to vaginal bleeding, contractions, leaking of fluid and fetal movement were reviewed in detail with the patient. Please refer  to After Visit Summary for other counseling recommendations.  Return in about 4 weeks (around 03/22/2017) for OB visit.   Hermina StaggersErvin, Ahlijah Raia L, MD

## 2017-02-22 NOTE — Patient Instructions (Signed)
Segundo trimestre de embarazo (Second Trimester of Pregnancy) El segundo trimestre va desde la semana13 hasta la 28, desde el cuarto hasta el sexto mes, y suele ser el momento en el que mejor se siente. En general, las nuseas matutinas han disminuido o han desaparecido completamente. Tendr ms energa y podr aumentarle el apetito. El beb por nacer (feto) se desarrolla rpidamente. Hacia el final del sexto mes, el beb mide aproximadamente 9 pulgadas (23 cm) y pesa alrededor de 1 libras (700 g). Es probable que sienta al beb moverse (dar pataditas) entre las 18 y 20 semanas del embarazo. CUIDADOS EN EL HOGAR  No fume, no consuma hierbas ni beba alcohol. No tome frmacos que el mdico no haya autorizado.  No consuma ningn producto que contenga tabaco, lo que incluye cigarrillos, tabaco de mascar o cigarrillos electrnicos. Si necesita ayuda para dejar de fumar, consulte al mdico. Puede recibir asesoramiento u otro tipo de apoyo para dejar de fumar.  Tome los medicamentos solamente como se lo haya indicado el mdico. Algunos medicamentos son seguros para tomar durante el embarazo y otros no lo son.  Haga ejercicios solamente como se lo haya indicado el mdico. Interrumpa la actividad fsica si comienza a tener calambres.  Ingiera alimentos saludables de manera regular.  Use un sostn que le brinde buen soporte si sus mamas estn sensibles.  No se d baos de inmersin en agua caliente, baos turcos ni saunas.  Colquese el cinturn de seguridad cuando conduzca.  No coma carne cruda ni queso sin cocinar; evite el contacto con las bandejas sanitarias de los gatos y la tierra que estos animales usan.  Tome las vitaminas prenatales.  Tome entre 1500 y 2000mg de calcio diariamente comenzando en la semana20 del embarazo hasta el parto.  Pruebe tomar un medicamento que la ayude a defecar (un laxante suave) si el mdico lo autoriza. Consuma ms fibra, que se encuentra en las frutas y  verduras frescas y los cereales integrales. Beba suficiente lquido para mantener el pis (orina) claro o de color amarillo plido.  Dese baos de asiento con agua tibia para aliviar el dolor o las molestias causadas por las hemorroides. Use una crema para las hemorroides si el mdico la autoriza.  Si se le hinchan las venas (venas varicosas), use medias de descanso. Levante (eleve) los pies durante 15minutos, 3 o 4veces por da. Limite el consumo de sal en su dieta.  No levante objetos pesados, use zapatos de tacones bajos y sintese derecha.  Descanse con las piernas elevadas si tiene calambres o dolor de cintura.  Visite a su dentista si no lo ha hecho durante el embarazo. Use un cepillo de cerdas suaves para cepillarse los dientes. Psese el hilo dental con suavidad.  Puede seguir manteniendo relaciones sexuales, a menos que el mdico le indique lo contrario.  Concurra a los controles mdicos.  SOLICITE AYUDA SI:  Siente mareos.  Sufre calambres o presin leves en la parte baja del vientre (abdomen).  Sufre un dolor persistente en el abdomen.  Tiene malestar estomacal (nuseas), vmitos, o tiene deposiciones acuosas (diarrea).  Advierte un olor ftido que proviene de la vagina.  Siente dolor al orinar.  SOLICITE AYUDA DE INMEDIATO SI:  Tiene fiebre.  Tiene una prdida de lquido por la vagina.  Tiene sangrado o pequeas prdidas vaginales.  Siente dolor intenso o clicos en el abdomen.  Sube o baja de peso rpidamente.  Tiene dificultades para recuperar el aliento y siente dolor en el pecho.  Sbitamente se   le hinchan mucho el rostro, las manos, los tobillos, los pies o las piernas.  No ha sentido los movimientos del beb durante una hora.  Siente un dolor de cabeza intenso que no se alivia con medicamentos.  Su visin se modifica.  Esta informacin no tiene como fin reemplazar el consejo del mdico. Asegrese de hacerle al mdico cualquier pregunta que  tenga. Document Released: 09/04/2012 Document Revised: 01/23/2014 Document Reviewed: 03/05/2012 Elsevier Interactive Patient Education  2017 Elsevier Inc.  

## 2017-02-22 NOTE — Progress Notes (Signed)
Pt states has been having a yellowish discharge with some odor

## 2017-02-23 LAB — CERVICOVAGINAL ANCILLARY ONLY
BACTERIAL VAGINITIS: POSITIVE — AB
CANDIDA VAGINITIS: NEGATIVE
TRICH (WINDOWPATH): NEGATIVE

## 2017-03-02 ENCOUNTER — Telehealth: Payer: Self-pay | Admitting: General Practice

## 2017-03-02 DIAGNOSIS — N760 Acute vaginitis: Principal | ICD-10-CM

## 2017-03-02 DIAGNOSIS — B9689 Other specified bacterial agents as the cause of diseases classified elsewhere: Secondary | ICD-10-CM

## 2017-03-02 MED ORDER — METRONIDAZOLE 500 MG PO TABS: 500.0000 mg | ORAL_TABLET | Freq: Two times a day (BID) | ORAL | 0 refills | Status: DC

## 2017-03-02 NOTE — Telephone Encounter (Signed)
-----   Message from Hermina StaggersMichael L Ervin, MD sent at 02/25/2017  7:52 AM EST ----- Flagyl 500 mg po bid x 7 days for BV Thanks Casimiro NeedleMichael

## 2017-03-02 NOTE — Telephone Encounter (Signed)
Called patient with Teresa Orozco for interpreter and informed patient of results & medication sent to pharmacy. Patient verbalized understanding and asked if the infection was back in her urine. Told patient this is an infection of the vagina due to an overgrowth of bacteria. Patient verbalized understanding to all & had no questions

## 2017-03-20 ENCOUNTER — Other Ambulatory Visit: Payer: Self-pay | Admitting: Obstetrics and Gynecology

## 2017-03-20 ENCOUNTER — Ambulatory Visit (HOSPITAL_COMMUNITY)
Admission: RE | Admit: 2017-03-20 | Discharge: 2017-03-20 | Disposition: A | Discharge status: Home / Self Care | Payer: Self-pay | Source: Ambulatory Visit | Attending: Obstetrics and Gynecology | Admitting: Obstetrics and Gynecology

## 2017-03-20 ENCOUNTER — Encounter (HOSPITAL_COMMUNITY): Payer: Self-pay

## 2017-03-20 ENCOUNTER — Other Ambulatory Visit (HOSPITAL_COMMUNITY): Payer: Self-pay | Admitting: *Deleted

## 2017-03-20 DIAGNOSIS — Z3689 Encounter for other specified antenatal screening: Secondary | ICD-10-CM

## 2017-03-20 DIAGNOSIS — Z3A18 18 weeks gestation of pregnancy: Secondary | ICD-10-CM | POA: Insufficient documentation

## 2017-03-20 DIAGNOSIS — Z3A19 19 weeks gestation of pregnancy: Secondary | ICD-10-CM

## 2017-03-20 DIAGNOSIS — O09522 Supervision of elderly multigravida, second trimester: Secondary | ICD-10-CM

## 2017-03-20 DIAGNOSIS — O09529 Supervision of elderly multigravida, unspecified trimester: Secondary | ICD-10-CM

## 2017-03-20 DIAGNOSIS — O0992 Supervision of high risk pregnancy, unspecified, second trimester: Secondary | ICD-10-CM

## 2017-03-22 ENCOUNTER — Encounter: Payer: Self-pay | Admitting: Obstetrics & Gynecology

## 2017-03-22 ENCOUNTER — Ambulatory Visit (INDEPENDENT_AMBULATORY_CARE_PROVIDER_SITE_OTHER): Payer: Self-pay | Admitting: Obstetrics & Gynecology

## 2017-03-22 VITALS — BP 123/63 | HR 93 | Wt 178.0 lb

## 2017-03-22 DIAGNOSIS — O0992 Supervision of high risk pregnancy, unspecified, second trimester: Secondary | ICD-10-CM

## 2017-03-22 DIAGNOSIS — N760 Acute vaginitis: Secondary | ICD-10-CM

## 2017-03-22 DIAGNOSIS — O09522 Supervision of elderly multigravida, second trimester: Secondary | ICD-10-CM

## 2017-03-22 DIAGNOSIS — O23592 Infection of other part of genital tract in pregnancy, second trimester: Secondary | ICD-10-CM

## 2017-03-22 MED ORDER — TERCONAZOLE 0.4 % VA CREA
1.0000 | TOPICAL_CREAM | Freq: Every day | VAGINAL | 0 refills | Status: DC
Start: 2017-03-22 — End: 2017-04-23

## 2017-03-22 NOTE — Patient Instructions (Signed)
Return to clinic for any scheduled appointments or obstetric concerns, or go to [facility] for evaluation  

## 2017-03-22 NOTE — Progress Notes (Signed)
Pt having vaginal itching and white-yellow discharge since completing her antibiotics

## 2017-03-22 NOTE — Progress Notes (Signed)
PRENATAL VISIT NOTE  Subjective:  Teresa Orozco is a 42 y.o. [id number] at [redacted]w[redacted]d being seen today for ongoing prenatal care. Patient is Spanish-speaking only, Spanish interpreter present for this encounter.  [name] and has Pregnancy, supervision, high-risk and Advanced maternal age in multigravida >40 on their problem list.  Patient reports vaginal irritation; just completed antibiotics for BV.  Contractions: Not present. Vag. Bleeding: None.  Movement: Present. Denies leaking of fluid.   The following portions of the patient's history were reviewed and updated as appropriate: allergies, current medications, past family history, past medical history, past social history, past surgical history and problem list. Problem list updated.  Objective:   Vitals:   03/22/17 0905  BP: 123/63  Pulse: 93  Weight: 178 lb (80.7 kg)    Fetal Status: Fetal Heart Rate (bpm): 150   Movement: Present     General:  Alert, oriented and cooperative. Patient is in no acute distress.  Skin: Skin is warm and dry. No rash noted.   Cardiovascular: Normal heart rate noted  Respiratory: Normal respiratory effort, no problems with respiration noted  Abdomen: Soft, gravid, appropriate for gestational age.  Pain/Pressure: Present     Pelvic: Cervical exam deferred      Mild erythema, white discharge noted.  Extremities: Normal range of motion.  Edema: None  Mental Status:  Normal mood and affect. Normal behavior. Normal judgment and thought content.   [place] Mfm Ob Detail +14 Wk  Result Date: 03/20/2017 ----------------------------------------------------------------------  OBSTETRICS REPORT                        (Corrected Final 03/20/2017 01:58                                                                          pm) ---------------------------------------------------------------------- Patient Info  ID #:       [id number]                           D.O.B.:  1975/06/03 (42 yrs)  Name:       Teresa Orozco                     Visit Date: 03/20/2017 01:35 pm ---------------------------------------------------------------------- Performed By  Performed By:     Marcellina Millin          Ref. Address:     [address]  [place], [place]                                                             [id number]  Attending:        Durwin NoraJeffrey M Denney       [facility]                    MD  Referred By:      Hermina StaggersMICHAEL L ERVIN                    MD ---------------------------------------------------------------------- Orders   #  Description                                 Code   1  US MFM OB DETAIL +14 WK                     [phone] [id number]  ----------------------------------------------------------------------   #  Ordered By               Order #        Accession #    Episode #   1  Nettie ElmMICHAEL ERVIN            [id number]      [id number]     [id number]  ---------------------------------------------------------------------- Indications   [redacted] weeks gestation of pregnancy                Z3A.18   Encounter for fetal anatomic survey            Z36.89   Advanced maternal age multigravida 6635+,        86O09.522   second trimester (declined testing)  ---------------------------------------------------------------------- OB History  Gravidity:    9         Term:   5        Prem:   0        SAB:   3  TOP:          0       Ectopic:  0        Living: 5 ---------------------------------------------------------------------- Fetal Evaluation  Num Of Fetuses:     1  Fetal Heart         149  Rate(bpm):  Cardiac Activity:   Observed  Presentation:       Variable  Placenta:           Posterior, above cervical os  P. Cord Insertion:  Visualized  Amniotic Fluid  AFI FV:      Subjectively within normal limits  ---------------------------------------------------------------------- Biometry  BPD:      39.8  mm     G. Age:  18w 0d         48  %    CI:        72.48   %    70 - 86  FL/HC:      17.8   %    15.8 - 18  HC:      148.7  mm     G. Age:  18w 0d         34  %    HC/AC:      1.16        1.07 - 1.29  AC:      128.2  mm     G. Age:  18w 3d         55  %    FL/BPD:     66.3   %  FL:       26.4  mm     G. Age:  18w 0d         41  %    FL/AC:      20.6   %    20 - 24  HUM:      26.1  mm     G. Age:  18w 1d         58  %  CER:      18.7  mm     G. Age:  18w 2d         57  %  Est. FW:     228  gm      0 lb 8 oz     50  % ---------------------------------------------------------------------- Gestational Age  LMP:           19w 3d        Date:  11/04/16                 EDD:   08/11/17  U/S Today:     18w 1d                                        EDD:   08/20/17  Best:          18w 1d     Det. By:  U/S (03/20/17)           EDD:   08/20/17 ---------------------------------------------------------------------- Anatomy  Cranium:               Appears normal         Aortic Arch:            Not well visualized  Cavum:                 Appears normal         Ductal Arch:            Appears normal  Ventricles:            Appears normal         Diaphragm:              Appears normal  Choroid Plexus:        Appears normal         Stomach:                Appears normal, left  sided  Cerebellum:            Appears normal         Abdomen:                Appears normal  Posterior Fossa:       Appears normal         Abdominal Wall:         Appears nml (cord                                                                        insert, abd wall)  Nuchal Fold:           Appears normal         Cord Vessels:           Appears normal (3                                                                        vessel cord)   Face:                  Appears normal         Kidneys:                Appear normal                         (orbits and profile)  Lips:                  Appears normal         Bladder:                Appears normal  Thoracic:              Appears normal         Spine:                  Not well visualized  Heart:                 Appears normal         Upper Extremities:      Appears normal                         (4CH, axis, and situs  RVOT:                  Appears normal         Lower Extremities:      Appears normal  LVOT:                  Not well visualized  Other:  Fetus appears to be a female. Heels and 5th digit visualized. Nasal          bone visualized. ---------------------------------------------------------------------- Cervix Uterus Adnexa  Cervix  Length:            3.8  cm.  Normal appearance by transabdominal scan. ---------------------------------------------------------------------- Impression  Single living intrauterine pregnancy at 18w 1d.  patient states LMP was a guess in context of irregular periods  dating by today's ultrasound, [facility] 08/20/17  Placenta Posterior, above cervical os.  Appropriate fetal growth.  Normal amniotic fluid volume.  The fetal anatomic survey is not complete.  No gross fetal anomalies identified.  The cervix measures 3.8cm transabdominally without  funneling.  The adnexa appear normal bilaterally without masses. ---------------------------------------------------------------------- Recommendations  Extreme AMA leads to the following recommendations:  Interval growth and completion of anatomy in 4 weeks.  Monthly growth  Antenatal testing to begin at 36 weeks. ----------------------------------------------------------------------                    Durwin Nora, MD Electronically Signed Corrected Final Report  03/20/2017 01:58 pm ----------------------------------------------------------------------   Assessment and Plan:  Pregnancy: [id number] at [redacted]w[redacted]d  1. Vaginitis  complicating current pregnancy, second trimester Likely yeast infection - terconazole (TERAZOL 7) 0.4 % vaginal cream; Place 1 applicator vaginally at bedtime. Use for seven days  Dispense: 45 g; Refill: 0  2. Elderly multigravida in second trimester Normal anatomy, declines genetics. Follow up scans ordered.   3. Supervision of high risk pregnancy in second trimester Preterm labor symptoms and general obstetric precautions including but not limited to vaginal bleeding, contractions, leaking of fluid and fetal movement were reviewed in detail with the patient. Please refer to After Visit Summary for other counseling recommendations.  Return in about 4 weeks (around 04/19/2017) for OB Visit (HOB).   Jaynie Collins, MD

## 2017-04-04 ENCOUNTER — Other Ambulatory Visit: Payer: Self-pay | Admitting: Obstetrics and Gynecology

## 2017-04-04 DIAGNOSIS — Z30432 Encounter for removal of intrauterine contraceptive device: Secondary | ICD-10-CM

## 2017-04-12 ENCOUNTER — Telehealth: Payer: Self-pay | Admitting: General Practice

## 2017-04-12 NOTE — Telephone Encounter (Signed)
Patient called and left message on voicemail line in spanish requesting call back. Called patient with pacific interpreter [phone], no answer- left message on voicemail stating we are trying to reach you to return your phone call, please call us back if you still need assistance.

## 2017-04-17 ENCOUNTER — Encounter (HOSPITAL_COMMUNITY): Payer: Self-pay

## 2017-04-17 ENCOUNTER — Ambulatory Visit (HOSPITAL_COMMUNITY)
Admission: RE | Admit: 2017-04-17 | Discharge: 2017-04-17 | Disposition: A | Discharge status: Home / Self Care | Payer: Self-pay | Source: Ambulatory Visit | Attending: Obstetrics and Gynecology | Admitting: Obstetrics and Gynecology

## 2017-04-17 ENCOUNTER — Other Ambulatory Visit (HOSPITAL_COMMUNITY): Payer: Self-pay | Admitting: Obstetrics and Gynecology

## 2017-04-17 ENCOUNTER — Other Ambulatory Visit (HOSPITAL_COMMUNITY): Payer: Self-pay | Admitting: *Deleted

## 2017-04-17 DIAGNOSIS — O09529 Supervision of elderly multigravida, unspecified trimester: Secondary | ICD-10-CM

## 2017-04-17 DIAGNOSIS — Z3A22 22 weeks gestation of pregnancy: Secondary | ICD-10-CM | POA: Insufficient documentation

## 2017-04-17 DIAGNOSIS — Z3686 Encounter for antenatal screening for cervical length: Secondary | ICD-10-CM | POA: Insufficient documentation

## 2017-04-17 DIAGNOSIS — Z3A23 23 weeks gestation of pregnancy: Secondary | ICD-10-CM

## 2017-04-17 DIAGNOSIS — O09522 Supervision of elderly multigravida, second trimester: Secondary | ICD-10-CM | POA: Insufficient documentation

## 2017-04-17 DIAGNOSIS — Z362 Encounter for other antenatal screening follow-up: Secondary | ICD-10-CM | POA: Insufficient documentation

## 2017-04-19 ENCOUNTER — Encounter: Payer: Self-pay | Admitting: Obstetrics & Gynecology

## 2017-04-23 ENCOUNTER — Ambulatory Visit (INDEPENDENT_AMBULATORY_CARE_PROVIDER_SITE_OTHER): Payer: Self-pay | Admitting: Family Medicine

## 2017-04-23 VITALS — BP 116/76 | HR 89 | Wt 181.2 lb

## 2017-04-23 DIAGNOSIS — O98312 Other infections with a predominantly sexual mode of transmission complicating pregnancy, second trimester: Secondary | ICD-10-CM

## 2017-04-23 DIAGNOSIS — A630 Anogenital (venereal) warts: Secondary | ICD-10-CM

## 2017-04-23 DIAGNOSIS — O09522 Supervision of elderly multigravida, second trimester: Secondary | ICD-10-CM

## 2017-04-23 DIAGNOSIS — O0992 Supervision of high risk pregnancy, unspecified, second trimester: Secondary | ICD-10-CM

## 2017-04-23 DIAGNOSIS — O98319 Other infections with a predominantly sexual mode of transmission complicating pregnancy, unspecified trimester: Secondary | ICD-10-CM

## 2017-04-23 DIAGNOSIS — Z641 Problems related to multiparity: Secondary | ICD-10-CM

## 2017-04-23 DIAGNOSIS — O358XX0 Maternal care for other (suspected) fetal abnormality and damage, not applicable or unspecified: Secondary | ICD-10-CM

## 2017-04-23 DIAGNOSIS — Z789 Other specified health status: Secondary | ICD-10-CM

## 2017-04-23 DIAGNOSIS — O35EXX0 Maternal care for other (suspected) fetal abnormality and damage, fetal genitourinary anomalies, not applicable or unspecified: Secondary | ICD-10-CM | POA: Insufficient documentation

## 2017-04-23 NOTE — Progress Notes (Signed)
Video Interpreter # [phone] Educated pt on Benefits of Breastfeeding for Mother

## 2017-04-23 NOTE — Assessment & Plan Note (Addendum)
Aldara is likely too expensive, may try Freeze away or in office TCA treatment--she has opted for TCA in office--start at 28 wks.

## 2017-04-23 NOTE — Progress Notes (Signed)
   PRENATAL VISIT NOTE  Subjective:  Teresa Orozco is a 42 y.o. [id number] at 2540w2d being seen today for ongoing prenatal care.  [name] and has Pregnancy, supervision, high-risk; Advanced maternal age in multigravida 41>40; Grand multipara; Mild UTD; Language barrier; and Genital warts complicating pregnancy on their problem list.  Patient reports rectal lesion with some itching..  Contractions: Not present. Vag. Bleeding: None.  Movement: Present. Denies leaking of fluid.   The following portions of the patient's history were reviewed and updated as appropriate: allergies, current medications, past family history, past medical history, past social history, past surgical history and problem list. Problem list updated.  Objective:   Vitals:   04/23/17 1032  BP: 116/76  Pulse: 89  Weight: 181 lb 3.2 oz (82.2 kg)    Fetal Status: Fetal Heart Rate (bpm): 143 Fundal Height: 26 cm Movement: Present     General:  Alert, oriented and cooperative. Patient is in no acute distress.  Skin: Skin is warm and dry. No rash noted.   Cardiovascular: Normal heart rate noted  Respiratory: Normal respiratory effort, no problems with respiration noted  Abdomen: Soft, gravid, appropriate for gestational age.  Pain/Pressure: Absent     Pelvic: Cervical exam deferred      small anal wart noted  Extremities: Normal range of motion.  Edema: None  Mental Status: Normal mood and affect. Normal behavior. Normal judgment and thought content.   Assessment and Plan:  Pregnancy: [id number] at 3940w2d  1. Multigravida of advanced maternal age in second trimester Age > 40--continue ASA U/s q 4 wks.  2. Supervision of high risk pregnancy in second trimester Continue prenatal care. 28 wk labs and TDaP at next visit  3. Grand multipara At risk of PPH  4. Mild UTD Right sided--noted on last u/s--f/u scheduled  5. Language barrier Video Spanish  interpreter: [phone] used   6. 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 Please refer to After Visit Summary for other counseling recommendations.  Return in 1 month (on 05/21/2017) for 28 wk labs, TCA treatment.  Future Appointments  Date Time Provider Department Center  05/21/2017  8:15 AM Allie Bossierove, [facility]ra C, [facility]  05/21/2017  8:50 AM [facility] LAB [facility] WOC  05/29/2017  8:30 AM [facility] US 5 [facility] M[facility]-US    Reva Boresanya S Viktor Philipp, MD

## 2017-04-23 NOTE — Patient Instructions (Signed)
Segundo trimestre de [employer] Trimester of Pregnancy El segundo trimestre va desde la semana14 hasta la 65, desde el cuarto hasta el sexto mes, y suele ser el momento en el que mejor se siente. Su organismo se ha adaptado a Public relations account executive, y comienza a Print production planner. En general, las nuseas matutinas han disminuido o han desaparecido completamente, puede tener ms energa y un aumento de apetito. El segundo trimestre es tambin la poca en la que el feto se desarrolla rpidamente. Hacia el final del sexto mes, el feto mide aproximadamente 9pulgadas (23cm) y pesa alrededor de 1 libras (700g). Es probable que sienta que el beb se Software engineer (da pataditas) entre las 71 y Slater. Cambios en el cuerpo durante el segundo trimestre Su cuerpo continua experimentando numerosos cambios durante su segundo trimestre. Estos cambios varan de [place] a [place].  Seguir American Family Insurance. Notar que la parte baja del abdomen sobresale.  Podrn aparecer las primeras Apache Corporation caderas, el abdomen y las [place].  Es posible que tenga dolores de cabeza que pueden aliviarse con ciertos medicamentos. Los medicamentos que tome deben estar aprobados por el mdico.  Tal vez tenga necesidad de orinar con ms frecuencia porque el feto est ejerciendo presin sobre la vejiga.  Debido al Glennis Brink podr sentir Victorio Palm estomacal con frecuencia.  Puede estar estreida, ya que ciertas hormonas enlentecen los movimientos de los msculos que JPMorgan Chase & Co desechos a travs de los intestinos.  Pueden aparecer hemorroides o abultarse e hincharse las venas (venas varicosas).  Puede sentir dolor en la espalda. Esto se debe a: ? Aumento de peso. ? Las hormonas del [place] articulaciones en la pelvis. ? Un cambio en el peso y los msculos que ayudan a Theatre manager su equilibrio.  Sus pechos seguirn creciendo y se pondrn cada vez ms sensibles.  Las Production manager y  estar sensibles al cepillado y al hilo dental.  Pueden aparecer zonas oscuras o manchas (cloasma, mscara del [place]) en el rostro. Esto probablemente se atenuar despus del nacimiento del beb.  Es posible que se forme una lnea oscura desde el ombligo hasta la zona del pubis (linea nigra). Esto probablemente se atenuar despus del nacimiento del beb.  Tal vez haya cambios en el cabello. Esto cambios pueden incluir su engrosamiento, crecimiento rpido y Harley-Davidson textura. Adems, a algunas mujeres se les cae el cabello durante o despus del embarazo, o tienen el cabello seco o fino. Lo ms probable es que el cabello se le normalice despus del nacimiento del beb.  Qu debe esperar en las visitas prenatales Durante una visita prenatal de rutina:  La pesarn para asegurarse de que usted y el feto estn creciendo normalmente.  Le tomarn la presin arterial.  Le medirn el abdomen para controlar el desarrollo del beb.  Se escucharn los latidos cardacos fetales.  Se evaluarn los resultados de los estudios solicitados en visitas anteriores.  El mdico puede preguntarle lo siguiente:  Cmo se siente.  Si siente los movimientos del beb.  Si ha tenido sntomas anormales, como prdida de lquido, [place], dolores de cabeza intensos o clicos abdominales.  Si est consumiendo algn producto que contenga tabaco, como cigarrillos, tabaco de Higher education careers adviser y Psychologist, sport and exercise.  Si tiene [place].  Otros estudios que podrn realizarse durante el segundo trimestre incluyen lo siguiente:  Anlisis de sangre para detectar lo siguiente: ? Concentraciones de hierro bajas (anemia). ? Nivel alto de azcar en la sangre que afecta a  las mujeres embarazadas (diabetes gestacional) entre las semanas 24 y 14. ? Anticuerpos Rh. Esto es para detectar una protena en los glbulos rojos (factor Rh).  Anlisis de orina para detectar infecciones, diabetes o protenas en la orina.  Una  ecografa para confirmar que el beb crece y se desarrolla correctamente.  Una amniocentesis para diagnosticar posibles problemas genticos.  Estudios del feto para descartar espina bfida y sndrome de [place].  Prueba del VIH (virus de inmunodeficiencia humana). Los exmenes prenatales de rutina incluyen la prueba de deteccin del VIH, a menos que decida no Radiation protection practitioner.  Siga estas indicaciones en su casa: [place] indicaciones del mdico en relacin con el uso de medicamentos. Durante el embarazo, hay medicamentos que pueden tomarse y 26 que no.  Tome vitaminas prenatales que contengan por lo menos [id number] (?g) de cido flico.  Si est estreida, tome un laxante suave, si el mdico lo autoriza. Qu debe comer y beber  Illene Bolus una dieta equilibrada que incluya gran cantidad de frutas y verduras frescas, cereales integrales, buenas fuentes de protenas como carnes [place], huevos o tofu, y lcteos descremados. El mdico la ayudar a Office manager cantidad de peso que puede Jo[place].  No coma carne cruda ni quesos sin cocinar. Estos elementos contienen grmenes que pueden causar defectos congnitos en el beb.  Si no consume muchos alimentos con calcio, hable con su mdico sobre si debera tomar un suplemento diario de calcio.  Limite el consumo de alimentos con alto contenido de grasas y azcares procesados, como alimentos fritos o dulces.  Para evitar el estreimiento: ? Bebe suficiente lquido para mantener la orina clara o de color amarillo plido. ? Consuma alimentos ricos en fibra, como frutas y verduras frescas, cereales integrales y frijoles. Actividad  Haga ejercicio solamente como se lo haya indicado el mdico. La mayora de las mujeres pueden continuar su rutina de ejercicios durante el [place]. Intente realizar como mnimo 34minutos de actividad fsica por lo menos 5das a la semana. Deje de hacer ejercicio si experimenta contracciones  uterinas.  No levante objetos pesados, use zapatos de tacones bajos y mantenga una buena postura.  Puede seguir American Electric Power, a menos que el mdico le indique lo contrario. Alivio del dolor y del Tree surgeon  Use un sostn que le brinde buen soporte para prevenir las molestias causadas por la sensibilidad en los pechos.  Dese baos de asiento con agua tibia para Best boy o las molestias causadas por las hemorroides. Use una crema para las hemorroides si el mdico la autoriza.  Descanse con las piernas elevadas si tiene calambres o dolor de cintura.  Si tiene venas varicosas, use medias de descanso. Eleve los pies durante 38minutos, 3 o 4veces por da. Limite el consumo de sal en su dieta. Cuidados prenatales  Escriba sus preguntas. Llvelas cuando concurra a las visitas prenatales.  Concurra a todas las visitas prenatales tal como se lo haya indicado el mdico. Esto es importante. Seguridad  Use el cinturn de seguridad en todo momento mientras conduce.  Haga una lista de los nmeros de telfono de Freight forwarder, que BJ's nmeros de telfono de familiares, [place], el hospital y los departamentos de polica y bomberos. Instrucciones generales  Pdale al mdico que la derive a clases de educacin prenatal en su localidad. Debe comenzar a tomar las clases antes de que empiece el mes6 de [name].  Pida ayuda si tiene necesidades nutricionales o de asesoramiento Solicitor. El mdico puede aconsejarla o derivarla a  especialistas para que la ayuden con diferentes necesidades.  No se d baos de inmersin en agua caliente, baos turcos ni saunas.  No se haga duchas vaginales ni use tampones o toallas higinicas perfumadas.  No mantenga las piernas cruzadas durante [place].  Evite el contacto con las bandejas sanitarias de los gatos y la tierra que estos animales usan. Estos elementos contienen bacterias que pueden causar defectos congnitos  al beb y la posible prdida del feto debido a un aborto espontneo o muerte fetal.  Evite fumar, consumir hierbas, beber alcohol y tomar frmacos que no le hayan recetado. Las sustancias qumicas que estos productos contienen pueden afectar la formacin y el desarrollo del beb.  No consuma ningn producto que contenga nicotina o tabaco, como cigarrillos y Administrator, Civil Service. Si necesita ayuda para dejar de fumar, consulte al American Express.  Visite a su dentista si an no lo ha Occupational hygienist. Use un cepillo de dientes blando para higienizarse los dientes y psese el hilo dental con suavidad. Comunquese con un mdico si:  Tiene mareos.  Siente clicos leves, presin en la pelvis o dolor persistente en el abdomen.  Tiene nuseas, vmitos o diarrea persistentes.  Brett Fairy secrecin vaginal con mal olor.  Siente dolor al ConocoPhillips. Solicite ayuda de inmediato si:  Tiene fiebre.  Tiene una prdida de lquido por la vagina.  Tiene sangrado o pequeas prdidas vaginales.  Siente dolor intenso o clicos en el abdomen.  Sube de peso o baja de peso rpidamente.  Tiene dificultad para respirar y siente dolor de pecho.  Sbitamente se le hinchan mucho el rostro, las [place], los tobillos, los pies o las piernas.  No ha sentido los movimientos del beb durante Georgianne Fick.  Siente un dolor de cabeza intenso que no se alivia al tomar United Parcel.  Nota cambios en la visin. Resumen  El segundo trimestre va desde la semana14 hasta la 27, desde el cuarto hasta el sexto mes. Es tambin una poca en la que el feto se desarrolla rpidamente.  Su organismo atraviesa por muchos cambios durante el [place]. Estos cambios varan de [place] a [place].  Evite fumar, consumir hierbas, beber alcohol y tomar frmacos que no le hayan recetado. Estas sustancias qumicas afectan la formacin y el desarrollo de su beb.  No consuma ningn producto que contenga tabaco, lo que incluye  cigarrillos, tabaco de Theatre manager y Administrator, Civil Service. Si necesita ayuda para dejar de fumar, consulte al mdico.  Comunquese con su mdico si tiene preguntas sobre esto. Concurra a todas las visitas prenatales tal como se lo haya indicado el mdico. Esto es importante. Esta informacin no tiene Theme park manager el consejo del mdico. Asegrese de hacerle al mdico cualquier pregunta que tenga. Document Released: 10/12/2004 Document Revised: 05/15/2016 Document Reviewed: 05/15/2016 Elsevier Interactive Patient Education  2018 Elsevier Inc.   [place] materna Breastfeeding Decidir amamantar es una de las mejores elecciones que puede hacer por usted y su beb. Un cambio en las hormonas durante el embarazo hace que las mamas produzcan leche materna en las glndulas productoras de [place]. Las hormonas impiden que la leche materna sea liberada antes del nacimiento del beb. Adems, impulsan el flujo de leche luego del nacimiento. Una vez que ha comenzado a Museum/gallery exhibitions officer, Conservation officer, nature beb, as Immunologist succin o Theatre manager, pueden estimular la liberacin de [place] de las glndulas productoras de [place]. Los beneficios de [name] investigaciones demuestran que la lactancia materna ofrece muchos beneficios de salud para bebs y  madres. Adems, ofrece una forma gratuita y conveniente de Corporate treasurer al beb. Para el beb  La primera leche (calostro) ayuda a Careers information officer funcionamiento del aparato digestivo del beb.  Las clulas especiales de la leche (anticuerpos) ayudan a Artist las infecciones en el beb.  Los bebs que se alimentan con leche materna tambin tienen menos probabilidades de tener asma, alergias, obesidad o diabetes de tipo 2. Adems, tienen menor riesgo de sufrir el sndrome de muerte sbita del lactante (SMSL).  Los nutrientes de la [place] materna son mejores para Patent examiner las necesidades del beb en comparacin con la CHS Inc.  La leche materna mejora el desarrollo  cerebral del beb. Para usted  La lactancia materna favorece el desarrollo de un vnculo muy especial entre la madre y el beb.  Es conveniente. La leche materna es econmica y siempre est disponible a la Human resources officer.  La lactancia materna ayuda a quemar caloras. Claude Manges a perder el peso ganado durante el [place].  Hace que el tero vuelva al tamao que tena antes del embarazo ms rpido. Adems, disminuye el sangrado (loquios) despus del parto.  La lactancia materna contribuye a reducir Nurse, adult de tener diabetes de tipo 2, osteoporosis, artritis reumatoide, enfermedades cardiovasculares y cncer de mama, ovario, tero y endometrio en el futuro. Informacin bsica sobre la lactancia Comienzo de la lactancia  Encuentre un lugar cmodo para sentarse o Teacher, music, con un buen respaldo para el cuello y la espalda.  Coloque una almohada o una manta enrollada debajo del beb para acomodarlo a la altura de la mama (si est sentada). Las almohadas para Museum/gallery exhibitions officer se han diseado especialmente a fin de servir de apoyo para los brazos y el beb Smithfield Foods.  Asegrese de que la barriga del beb (abdomen) est frente a la suya.  Masajee suavemente la mama. Con las yemas de los dedos, Liberty Media bordes exteriores de la mama hacia adentro, en direccin al pezn. Esto estimula el flujo de [place]. Si la Home [employer]ot, es posible que deba Educational psychologist con este movimiento durante la Market researcher.  Sostenga la mama con 4 dedos por debajo y Multimedia programmer por arriba del pezn (forme la letra "C" con la mano). Asegrese de que los dedos se encuentren lejos del pezn y de la boca del beb.  Empuje suavemente los labios del beb con el pezn o con el dedo.  Cuando la boca del beb se abra lo suficiente, acrquelo rpidamente a la mama e introduzca todo el pezn y la arola, tanto como sea posible, dentro de la boca del beb. La arola es la zona de color que rodea al pezn. ? Debe haber ms  arola visible por arriba del labio superior del beb que por debajo del labio inferior. ? Los labios del beb deben estar abiertos y extendidos hacia afuera (evertidos) para asegurar que el beb se prenda de forma adecuada y cmoda. ? La lengua del beb debe estar entre la enca inferior y Educational psychologist.  Asegrese de que la boca del beb est en la posicin correcta alrededor del pezn (prendido). Los labios del beb deben crear un sello sobre la mama y estar doblados hacia afuera (invertidos).  Es comn que el beb succione durante 2 a 3 minutos para que comience el flujo de [place]. Cmo debe prenderse Es muy importante que le ensee al beb cmo prenderse adecuadamente a la mama. Si el beb no se prende adecuadamente, puede causar Federated Department Stores, reducir la produccin de [place]  materna y hacer que el beb tenga un escaso aumento de Zeiglerpeso. Adems, si el beb no se prende adecuadamente al pezn, puede tragar aire durante la alimentacin. Esto puede causarle molestias al beb. Hacer eructar al beb al Pilar Platecambiar de mama puede ayudarlo a liberar el aire. Sin embargo, ensearle al beb cmo prenderse a la mama adecuadamente es la mejor manera de evitar que se sienta molesto por tragar Oceanographeraire mientras se alimenta. Signos de que el beb se ha prendido adecuadamente al pezn  Tironea o succiona de modo silencioso, sin Publishing rights managercausarle dolor. Los labios del beb deben estar extendidos hacia afuera (evertidos).  Se escucha que traga cada 3 o 4 succiones una vez que la WPS Resourcesleche ha comenzado a Radiographer, therapeuticfluir (despus de que se produzca el reflejo de eyeccin de la [place] Brooklynleche).  Hay movimientos musculares por arriba y por delante de sus odos al Printmakersuccionar.  Signos de que el beb no se ha prendido Audiological scientistadecuadamente al pezn  Hace ruidos de succin o de chasquido mientras se Tree surgeonalimenta.  Siente dolor en los pezones.  Si cree que el beb no se prendi correctamente, deslice el dedo en la comisura de la boca y Ameren Corporationcolquelo entre las encas  del beb para interrumpir la succin. Intente volver a comenzar a Museum/gallery exhibitions officeramamantar. Signos de Market researcherlactancia materna exitosa Signos del beb  El beb disminuir gradualmente el nmero de succiones o dejar de succionar por completo.  El beb se quedar dormido.  El cuerpo del beb se relajar.  El beb retendr Neomia Dearuna pequea cantidad de Kindred Healthcareleche en la boca.  El beb se desprender solo del Glassportpecho.  Signos que presenta usted  Las mamas han aumentado la firmeza, el peso y el tamao 1 a 3 horas despus de Museum/gallery exhibitions officeramamantar.  Estn ms blandas inmediatamente despus de amamantar.  Se producen un aumento del volumen de Azerbaijanleche y un cambio en su consistencia y color hacia el quinto da de Market researcherlactancia.  Los pezones no duelen, no estn agrietados ni sangran.  Signos de que su beb recibe la cantidad de leche suficiente  Mojar por lo menos 1 o 2paales durante las primeras 24horas despus del nacimiento.  Mojar por lo menos 5 o 6paales cada 24horas durante la primera semana despus del nacimiento. La orina debe ser clara o de color amarillo plido a los 5das de vida.  Mojar entre 6 y 8paales cada 24horas a medida que el beb sigue creciendo y desarrollndose.  Defeca por lo menos 3 veces en 24 horas a los 5 [address]  [address] de [address]. Las heces deben ser blandas y Armed forces operational officeramarillentas.  Defeca por lo menos 3 veces en 24 horas a los [address] das de [address]. Las heces deben ser grumosas y Armed forces operational officeramarillentas.  No registra una prdida de peso mayor al 10% del peso al nacer durante los primeros 3 [address]  [address]das de [place]vida.  Aumenta de peso un promedio de 4 a 7onzas (113 a 198g) por semana despus de los 4 [address]  [address] de vida.  Aumenta de Eastonpeso, Merigolddiariamente, de Talmagemanera uniforme a Glass blower/designerpartir de los 5 [address]  [address] de vida, sin Passenger transport managerregistrar prdida de peso despus de las 2semanas de vida. Despus de alimentarse, es posible que el beb regurgite una pequea cantidad de Nilandleche. Esto es normal. Frecuencia y duracin de la lactancia El amamantamiento frecuente la ayudar a producir  ms Azerbaijanleche y puede prevenir dolores en los pezones y las mamas extremadamente llenas (congestin Round Lakemamaria). Alimente al beb cuando muestre signos de hambre o si siente la necesidad de reducir la congestin de las W[place] Homesteadsmamas. Esto se  denomina "lactancia a demanda". Las seales de que el beb tiene hambre incluyen las siguientes:  Aumento del [place] de [place], [place] o inquietud.  Mueve la cabeza de un lado a otro.  Abre la boca cuando se le toca la mejilla o la comisura de la boca (reflejo de bsqueda).  Aumenta las vocalizaciones, tales como sonidos de succin, se relame los labios, emite arrullos, suspiros o chirridos.  Mueve la [employer] Apparel Group boca y se chupa los dedos o las manos.  Est molesto o llora.  Evite el uso del chupete en las primeras 4 a 6 semanas despus del nacimiento del beb. Despus de este perodo, podr usar un chupete. Las investigaciones demostraron que el uso del chupete durante Financial risk analyst ao de vida del beb disminuye el riesgo de tener el sndrome de muerte sbita del lactante (SMSL). Permita que el nio se alimente en cada mama todo lo que desee. Cuando el beb se desprende o se queda dormido mientras se est alimentando de la primera mama, ofrzcale la segunda. Debido a que, con frecuencia, los recin nacidos estn somnolientos las primeras semanas de vida, es posible que deba despertar al beb para alimentarlo. Los horarios de Acupuncturist de un beb a otro. Sin embargo, las siguientes reglas pueden servir como gua para ayudarla a Lawyer que el beb se alimenta adecuadamente:  Se puede amamantar a los recin nacidos (bebs de 4 semanas o menos de vida) cada 1 a 3 horas.  No deben transcurrir ms de 3 horas durante el da o 5 horas durante la noche sin que se amamante a los recin nacidos.  Debe amamantar al beb un mnimo de 8 veces en un perodo de 24 horas.  Extraccin de Bank of America extraccin y Contractor de la leche materna le permiten  asegurarse de que el beb se alimente exclusivamente de su leche materna, aun en momentos en los que no puede Museum/gallery exhibitions officer. Esto tiene especial importancia si debe regresar al Aleen Campi en el perodo en que an est amamantando o si no puede estar presente en los momentos en que el beb debe alimentarse. Su asesor en lactancia puede ayudarla a Clinical research associate un mtodo de extraccin que funcione mejor para usted y Programmer, systems cunto tiempo es seguro almacenar [place]. Cmo cuidar las mamas durante la lactancia Los pezones pueden secarse, Lobbyist y doler durante la Market researcher. Las siguientes recomendaciones pueden ayudarla a Pharmacologist las TEPPCO Partners y sanas:  Careers information officer usar jabn en los pezones.  Use un sostn de soporte diseado especialmente para la lactancia materna. Evite usar sostenes con aro o sostenes muy ajustados (sostenes deportivos).  Seque al aire sus pezones durante 3 a despus de amamantar al beb.  Utilice solo apsitos de Haematologist sostn para Environmental health practitioner las prdidas de [place]. La prdida de un poco de Public Service Enterprise Group tomas es normal.  Utilice lanolina sobre los pezones luego de Museum/gallery exhibitions officer. La lanolina ayuda a mantener la humedad normal de la piel. La lanolina pura no es perjudicial (no es txica) para el beb. Adems, puede extraer Beazer Homes algunas gotas de [place] materna y Engineer, maintenance (IT) suavemente esa ToysRus pezones para que la [place] se seque al aire.  Durante las primeras semanas despus del nacimiento, algunas mujeres experimentan [place]. La congestin [employer] Corporation puede hacer que sienta las mamas pesadas, calientes y sensibles al tacto. El pico de la congestin mamaria ocurre en el plazo de los 3 a 5 das despus del [place]. Las siguientes recomendaciones pueden  ayudarla a Technical sales engineer congestin mamaria:  Vace por completo las mamas al QUALCOMM o DTE Energy Company. Puede aplicar calor hmedo en las mamas (en la ducha o con toallas hmedas para manos)  antes de Museum/gallery exhibitions officer o extraer WPS Resources. Esto aumenta la circulacin y [place] a que la [place]. Si el beb no vaca por completo las [address] cuando lo [address], extraiga la [place] restante despus de que haya finalizado.  Aplique compresas de hielo Yahoo! Inc inmediatamente despus de Museum/gallery exhibitions officer o extraer [place], a menos que le resulte demasiado incmodo. Haga lo siguiente: ? Ponga el hielo en una bolsa plstica. ? Coloque una FirstEnergy Corp piel y la bolsa de hielo. ? Coloque el hielo durante , 2 o 3veces por da.  Asegrese de que el beb est prendido y se encuentre en la posicin correcta mientras lo alimenta.  Si la congestin mamaria persiste luego de 48 horas o despus de seguir estas recomendaciones, comunquese con su mdico o un Holiday representative. Recomendaciones de salud general durante la lactancia  Consuma 3 comidas y 3 colaciones saludables todos los [place]. Las M.D.C. Holdings bien alimentadas que amamantan necesitan entre 450 y 500 caloras adicionales por Futures trader. Puede cumplir con este requisito al aumentar la cantidad de una dieta equilibrada que realice.  Beba suficiente agua para mantener la orina clara o de color amarillo plido.  Descanse con frecuencia, reljese y siga tomando sus vitaminas prenatales para prevenir la fatiga, el estrs y los niveles bajos de vitaminas y [employer] en el cuerpo (deficiencias de nutrientes).  No consuma ningn producto que contenga nicotina o tabaco, como cigarrillos y Administrator, Civil Service. El beb puede verse afectado por las sustancias qumicas de los cigarrillos que pasan a la [place] materna y por la exposicin al humo ambiental del tabaco. Si necesita ayuda para dejar de fumar, consulte al mdico.  Evite el consumo de alcohol.  No consuma drogas ilegales o marihuana.  Antes de Dietitian, hable con el mdico. Estos incluyen medicamentos recetados y de [place], como tambin vitaminas y suplementos a base de hierbas.  Algunos medicamentos, que pueden ser perjudiciales para el beb, pueden pasar a travs de la [employer].  Puede quedar embarazada durante la lactancia. Si se desea un mtodo anticonceptivo, consulte al mdico sobre cules son las opciones seguras durante la Market researcher. Dnde encontrar ms informacin: Liga internacional [place]: [email or web address]/. Comunquese con un mdico si:  Siente que quiere dejar de Museum/gallery exhibitions officer o se siente frustrada con la lactancia.  Sus pezones estn agrietados o Water quality scientist.  Sus mamas estn irritadas, sensibles o calientes.  Tiene los siguientes sntomas: ? Dolor en las mamas o en los pezones. ? Un rea hinchada en cualquiera de las mamas. ? Grant Ruts o escalofros. ? Nuseas o vmitos. ? Drenaje de otro lquido distinto de la [employer] materna desde los pezones.  Sus mamas no se llenan antes de Museum/gallery exhibitions officer al beb para el quinto da despus del [place].  Se siente triste y deprimida.  El beb: ? Est demasiado somnoliento como para comer bien. ? Tiene problemas para dormir. ? Tiene ms de 1 semana de vida y [employer] de 6 paales en un periodo de 24 horas. ? No ha aumentado de [place] a los [address] de [address]  El beb defeca menos de 3 veces en 24 horas.  La piel del beb o las partes blancas de los ojos se vuelven amarillentas. Solicite ayuda de inmediato si:  El beb est muy cansado Retail buyer) y no se quiere  despertar para comer.  Le sube la fiebre sin causa. Resumen  La lactancia materna ofrece muchos beneficios de salud para bebs y Cutlermadres.  Intente amamantar a su beb cuando muestre signos tempranos de hambre.  Haga cosquillas o empuje suavemente los labios del beb con el dedo o el pezn para lograr que el beb abra la boca. Acerque el beb a la mama. Asegrese de que la mayor parte de la arola se encuentre dentro de la boca del beb. Ofrzcale una mama y haga eructar al beb antes de pasar a la otra.  Hable con su mdico o asesor en lactancia si tiene dudas o  problemas con la lactancia. Esta informacin no tiene Theme park managercomo fin reemplazar el consejo del mdico. Asegrese de hacerle al mdico cualquier pregunta que tenga. Document Released: 01/02/2005 Document Revised: 04/24/2016 Document Reviewed: 04/24/2016 Elsevier Interactive Patient Education  2018 [employer]

## 2017-05-21 ENCOUNTER — Other Ambulatory Visit: Payer: Self-pay

## 2017-05-21 ENCOUNTER — Ambulatory Visit (INDEPENDENT_AMBULATORY_CARE_PROVIDER_SITE_OTHER): Payer: Self-pay | Admitting: Obstetrics & Gynecology

## 2017-05-21 VITALS — BP 110/58 | HR 90 | Wt 185.9 lb

## 2017-05-21 DIAGNOSIS — O0993 Supervision of high risk pregnancy, unspecified, third trimester: Secondary | ICD-10-CM

## 2017-05-21 DIAGNOSIS — O98313 Other infections with a predominantly sexual mode of transmission complicating pregnancy, third trimester: Secondary | ICD-10-CM

## 2017-05-21 DIAGNOSIS — A630 Anogenital (venereal) warts: Secondary | ICD-10-CM

## 2017-05-21 DIAGNOSIS — O0992 Supervision of high risk pregnancy, unspecified, second trimester: Secondary | ICD-10-CM

## 2017-05-21 DIAGNOSIS — Z789 Other specified health status: Secondary | ICD-10-CM

## 2017-05-21 DIAGNOSIS — O09523 Supervision of elderly multigravida, third trimester: Secondary | ICD-10-CM

## 2017-05-21 DIAGNOSIS — Z641 Problems related to multiparity: Secondary | ICD-10-CM

## 2017-05-21 LAB — POCT URINALYSIS DIP (DEVICE)
GLUCOSE, UA: NEGATIVE mg/dL
KETONES UR: NEGATIVE mg/dL
Nitrite: NEGATIVE
Protein, ur: 30 mg/dL — AB
SPECIFIC GRAVITY, URINE: 1.02 (ref 1.005–1.030)
Urobilinogen, UA: 1 mg/dL (ref 0.0–1.0)
pH: 7 (ref 5.0–8.0)

## 2017-05-21 NOTE — Progress Notes (Signed)
   PRENATAL VISIT NOTE  Subjective:  Teresa Orozco is a 42 y.o. [id number] at [redacted]w[redacted]d being seen today for ongoing prenatal care.  [name] and has Pregnancy, supervision, high-risk; Advanced maternal age in multigravida >65; Grand multipara; Mild UTD; Language barrier; and Genital warts complicating pregnancy on their problem list.  Patient reports no complaints.  Contractions: Not present. Vag. Bleeding: None.  Movement: Present. Denies leaking of fluid.   The following portions of the patient's history were reviewed and updated as appropriate: allergies, current medications, past family history, past medical history, past social history, past surgical history and problem list. Problem list updated.  Objective:   Vitals:   05/21/17 0846  BP: (!) 110/58  Pulse: 90  Weight: 185 lb 14.4 oz (84.3 kg)    Fetal Status: Fetal Heart Rate (bpm): 138   Movement: Present     General:  Alert, oriented and cooperative. Patient is in no acute distress.  Skin: Skin is warm and dry. No rash noted.   Cardiovascular: Normal heart rate noted  Respiratory: Normal respiratory effort, no problems with respiration noted  Abdomen: Soft, gravid, appropriate for gestational age.  Pain/Pressure: Present     Pelvic: Cervical exam deferred        Extremities: Normal range of motion.  Edema: None  Mental Status: Normal mood and affect. Normal behavior. Normal judgment and thought content.   Assessment and Plan:  Pregnancy: A5W0981 at [redacted]w[redacted]d  1. Supervision of high risk pregnancy, antepartum, third trimester  - CBC - Glucose Tolerance, 2 Hours w/1 Hour - HIV antibody - RPR - TSH - She declines TDAP  2. Supervision of high risk pregnancy in third trimester   3. Language barrier - Interpreter used for visit  4. Genital warts- 4 small anal warts treated with TCA. She tolerated the procedure well.  4. Grand multipara   5.  Multigravida of advanced maternal age in third trimester   6. 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 She tolerated the procedure well.  Preterm labor symptoms and general obstetric precautions including but not limited to vaginal bleeding, contractions, leaking of fluid and fetal movement were reviewed in detail with the patient. Please refer to After Visit Summary for other counseling recommendations.  Return in about 3 weeks (around 06/11/2017).  Future Appointments  Date Time Provider Department Center  05/29/2017  8:30 AM [facility] [place] 5 [facility]    Allie Bossier, MD

## 2017-05-21 NOTE — Progress Notes (Signed)
Pt c/o cough which is resolving, but has lower right flank pain. Will run U/A--trace hgb, small leuks Spanish interpreter used

## 2017-05-22 ENCOUNTER — Encounter: Payer: Self-pay | Admitting: *Deleted

## 2017-05-22 LAB — CBC
HEMATOCRIT: 34.3 % (ref 34.0–46.6)
Hemoglobin: 11.5 g/dL (ref 11.1–15.9)
MCH: 30.2 pg (ref 26.6–33.0)
MCHC: 33.5 g/dL (ref 31.5–35.7)
MCV: 90 fL (ref 79–97)
PLATELETS: 298 10*3/uL (ref 150–379)
RBC: 3.81 x10E6/uL (ref 3.77–5.28)
RDW: 15.9 % — AB (ref 12.3–15.4)
WBC: 9.2 10*3/uL (ref 3.4–10.8)

## 2017-05-22 LAB — TSH: TSH: 2.3 u[IU]/mL (ref 0.450–4.500)

## 2017-05-22 LAB — GLUCOSE TOLERANCE, 2 HOURS W/ 1HR
GLUCOSE, 2 HOUR: 110 mg/dL (ref 65–152)
Glucose, 1 hour: 105 mg/dL (ref 65–179)
Glucose, Fasting: 82 mg/dL (ref 65–91)

## 2017-05-22 LAB — HIV ANTIBODY (ROUTINE TESTING W REFLEX): HIV Screen 4th Generation wRfx: NONREACTIVE

## 2017-05-22 LAB — RPR: RPR Ser Ql: NONREACTIVE

## 2017-05-29 ENCOUNTER — Ambulatory Visit (HOSPITAL_COMMUNITY): Payer: Self-pay

## 2017-05-30 ENCOUNTER — Ambulatory Visit (HOSPITAL_COMMUNITY)
Admission: RE | Admit: 2017-05-30 | Discharge: 2017-05-30 | Disposition: A | Discharge status: Home / Self Care | Payer: Self-pay | Source: Ambulatory Visit | Attending: Obstetrics and Gynecology | Admitting: Obstetrics and Gynecology

## 2017-05-30 ENCOUNTER — Encounter (HOSPITAL_COMMUNITY): Payer: Self-pay

## 2017-05-30 DIAGNOSIS — O09529 Supervision of elderly multigravida, unspecified trimester: Secondary | ICD-10-CM

## 2017-05-30 DIAGNOSIS — Z641 Problems related to multiparity: Secondary | ICD-10-CM

## 2017-05-30 DIAGNOSIS — O98313 Other infections with a predominantly sexual mode of transmission complicating pregnancy, third trimester: Secondary | ICD-10-CM

## 2017-05-30 DIAGNOSIS — O35EXX0 Maternal care for other (suspected) fetal abnormality and damage, fetal genitourinary anomalies, not applicable or unspecified: Secondary | ICD-10-CM

## 2017-05-30 DIAGNOSIS — Z3A28 28 weeks gestation of pregnancy: Secondary | ICD-10-CM | POA: Insufficient documentation

## 2017-05-30 DIAGNOSIS — O358XX0 Maternal care for other (suspected) fetal abnormality and damage, not applicable or unspecified: Secondary | ICD-10-CM

## 2017-05-30 DIAGNOSIS — A630 Anogenital (venereal) warts: Secondary | ICD-10-CM

## 2017-05-30 DIAGNOSIS — O09522 Supervision of elderly multigravida, second trimester: Secondary | ICD-10-CM | POA: Insufficient documentation

## 2017-06-14 ENCOUNTER — Ambulatory Visit (INDEPENDENT_AMBULATORY_CARE_PROVIDER_SITE_OTHER): Payer: Self-pay | Admitting: Obstetrics & Gynecology

## 2017-06-14 ENCOUNTER — Other Ambulatory Visit: Payer: Self-pay

## 2017-06-14 VITALS — BP 113/64 | HR 89 | Wt 187.0 lb

## 2017-06-14 DIAGNOSIS — O0993 Supervision of high risk pregnancy, unspecified, third trimester: Secondary | ICD-10-CM

## 2017-06-14 DIAGNOSIS — O98313 Other infections with a predominantly sexual mode of transmission complicating pregnancy, third trimester: Secondary | ICD-10-CM

## 2017-06-14 DIAGNOSIS — O09523 Supervision of elderly multigravida, third trimester: Secondary | ICD-10-CM

## 2017-06-14 DIAGNOSIS — A630 Anogenital (venereal) warts: Secondary | ICD-10-CM

## 2017-06-14 NOTE — Patient Instructions (Signed)
Regrese a la clinica cuando tenga su cita. Si tiene problemas o preguntas, llama a la clinica o vaya a la sala [facility] al [facility].    

## 2017-06-14 NOTE — Progress Notes (Signed)
PRENATAL VISIT NOTE  Subjective:  Teresa Orozco is a 42 y.o. [id number] at [redacted]w[redacted]d being seen today for ongoing prenatal care.  Patient is Spanish-speaking only, Spanish interpreter present for this encounter. [name] and has Pregnancy, supervision, high-risk; Advanced maternal age in multigravida >4; Grand multipara; Language barrier; and Genital warts complicating pregnancy on their problem list.  Patient reports no complaints.  Contractions: Not present. Vag. Bleeding: None.  Movement: Present. Denies leaking of fluid.   The following portions of the patient's history were reviewed and updated as appropriate: allergies, current medications, past family history, past medical history, past social history, past surgical history and problem list. Problem list updated.  Objective:   Vitals:   06/14/17 1114  BP: 113/64  Pulse: 89  Weight: 187 lb (84.8 kg)    Fetal Status: Fetal Heart Rate (bpm): 143 Fundal Height: 31 cm Movement: Present     General:  Alert, oriented and cooperative. Patient is in no acute distress.  Skin: Skin is warm and dry. No rash noted.   Cardiovascular: Normal heart rate noted  Respiratory: Normal respiratory effort, no problems with respiration noted  Abdomen: Soft, gravid, appropriate for gestational age.  Pain/Pressure: Absent     Pelvic: Cervical exam deferred        Extremities: Normal range of motion.     Mental Status: Normal mood and affect. Normal behavior. Normal judgment and thought content.  [place] Mfm Ob Follow Up  Result Date: 05/30/2017 ----------------------------------------------------------------------  OBSTETRICS REPORT                      (Signed Final 05/30/2017 04:26 pm) ---------------------------------------------------------------------- Patient Info  ID #:       [id number]                          D.O.B.:  1975-08-06 (42 yrs)  Name:       Teresa Orozco                     Visit  Date: 05/30/2017 03:58 pm ---------------------------------------------------------------------- Performed By  Performed By:     Earley Brooke     Secondary Phy.:   [facility]                    BS, RDMS                                                             [facility] for                                                             [facility]  Attending:        Charlsie Merles MD         Address:          [facility]                                                             [address]                                                             [place], [place]                                                             [address]  Referred By:      Hermina Staggers        Location:         [facility]                    MD  Ref. Address:     [address]                    [place], [place]                    [address] ---------------------------------------------------------------------- Orders   #  Description                                 Code   1  [place] MFM OB FOLLOW UP                         62130.86  ----------------------------------------------------------------------   #  Ordered By               Order #        Accession #    Episode #   1  MARK Ezzard Standing              [id number]      [id number]  [id number]  ---------------------------------------------------------------------- Indications   [redacted] weeks gestation of pregnancy                Z3A.66   Advanced maternal age multigravida 55+,        O82.522   second trimester (declined testing)   Fetal abnormality - other known or             O35.9XX0   suspected (Rt sided UTD)  ---------------------------------------------------------------------- OB History  Gravidity:    9         Term:   5        Prem:   0        SAB:   3   TOP:          0       Ectopic:  0        Living: 5 ---------------------------------------------------------------------- Fetal Evaluation  Num Of Fetuses:     1  Fetal Heart         162  Rate(bpm):  Cardiac Activity:   Observed  Presentation:       Cephalic  Placenta:           Posterior, above cervical os  P. Cord Insertion:  Previously Visualized  Amniotic Fluid  AFI FV:      Subjectively within normal limits  AFI Sum(cm)     %Tile       Largest Pocket(cm)  18.02           69          6.3  RUQ(cm)       RLQ(cm)       LUQ(cm)        LLQ(cm)  3.41          6.06          2.25           6.3 ---------------------------------------------------------------------- Biometry  BPD:      73.7  mm     G. Age:  29w 4d         78  %    CI:        75.09   %    70 - 86                                                          FL/HC:      19.1   %    18.8 - 20.6  HC:      269.8  mm     G. Age:  29w 3d         54  %    HC/AC:      1.10        1.05 - 1.21  AC:      245.7  mm     G. Age:  28w 5d         59  %    FL/BPD:     69.9   %    71 - 87  FL:       51.5  mm     G. Age:  27w 4d         17  %    FL/AC:      21.0   %    20 - 24  Est. FW:    1236  gm    2 lb 12 oz      55  % ---------------------------------------------------------------------- Gestational Age  LMP:           29w 4d        Date:  11/04/16                 EDD:   08/11/17  U/S Today:     28w 6d                                        EDD:   08/16/17  Best:          [name] 2d     Det. By:  U/S  (03/20/17)          EDD:   08/20/17 ---------------------------------------------------------------------- Anatomy  Cranium:               Appears normal         Aortic Arch:            Previously seen  Cavum:                 Previously seen        Ductal Arch:            Previously seen  Ventricles:            Appears normal         Diaphragm:              Appears normal  Choroid Plexus:        Previously seen        Stomach:                Appears normal, left                                                                         sided  Cerebellum:            Previously seen        Abdomen:                Appears normal  Posterior Fossa:       Previously seen        Abdominal Wall:         Previously seen  Nuchal Fold:           Previously seen        Cord Vessels:           Previously seen  Face:                  Orbits and profile     Kidneys:                Appear normal                         previously seen  Lips:                  Previously seen        Bladder:  Appears normal  Thoracic:              Appears normal         Spine:                  Previously seen  Heart:                 Appears normal         Upper Extremities:      Previously seen                         (4CH, axis, and situs  RVOT:                  Previously seen        Lower Extremities:      Previously seen  LVOT:                  Previously seen  Other:  Heels, 5th digit, and Nasal bone previously visualized. ---------------------------------------------------------------------- Cervix Uterus Adnexa  Cervix  Not adaquately visualized ---------------------------------------------------------------------- Impression  Singleton intrauterine pregnancy at 28+2 weeks with AMA  and UTD here for reevaluation of the UTD  Interval review of the anatomy shows no sonographic  markers for aneuploidy or structural anomalies; the previously  noted UTD no longer meets pathologic criteria for UTD  All relevant fetal anatomy has been visualized  Amniotic fluid volume is normal  Estimated fetal weight shows growth in the 55th percentile ---------------------------------------------------------------------- Recommendations  Follow-up ultrasounds as clinically indicated. ----------------------------------------------------------------------                 Charlsie Merles, MD Electronically Signed Final Report   05/30/2017 04:26 pm ----------------------------------------------------------------------   Assessment and  Plan:  Pregnancy: Z6X0960 at [redacted]w[redacted]d  1. Multigravida of advanced maternal age in third trimester Will start antenatal testing at 48 weeks - [place] MFM OB FOLLOW UP; Future  2. 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 Declines further TCA treatment.  3. Supervision of high risk pregnancy in third trimester Preterm labor symptoms and general obstetric precautions including but not limited to vaginal bleeding, contractions, leaking of fluid and fetal movement were reviewed in detail with the patient. Please refer to After Visit Summary for other counseling recommendations.  Return in about 2 weeks (around 06/28/2017) for OB Visit (HOB).   Jaynie Collins, MD

## 2017-06-28 ENCOUNTER — Other Ambulatory Visit (HOSPITAL_COMMUNITY): Payer: Self-pay | Admitting: *Deleted

## 2017-06-28 ENCOUNTER — Encounter (HOSPITAL_COMMUNITY): Payer: Self-pay

## 2017-06-28 ENCOUNTER — Ambulatory Visit (HOSPITAL_COMMUNITY)
Admission: RE | Admit: 2017-06-28 | Discharge: 2017-06-28 | Disposition: A | Discharge status: Home / Self Care | Payer: Self-pay | Source: Ambulatory Visit | Attending: Obstetrics & Gynecology | Admitting: Obstetrics & Gynecology

## 2017-06-28 ENCOUNTER — Other Ambulatory Visit: Payer: Self-pay | Admitting: Obstetrics & Gynecology

## 2017-06-28 DIAGNOSIS — Z362 Encounter for other antenatal screening follow-up: Secondary | ICD-10-CM

## 2017-06-28 DIAGNOSIS — O359XX0 Maternal care for (suspected) fetal abnormality and damage, unspecified, not applicable or unspecified: Secondary | ICD-10-CM

## 2017-06-28 DIAGNOSIS — O09529 Supervision of elderly multigravida, unspecified trimester: Secondary | ICD-10-CM

## 2017-06-28 DIAGNOSIS — O09523 Supervision of elderly multigravida, third trimester: Secondary | ICD-10-CM

## 2017-06-28 DIAGNOSIS — Z3A32 32 weeks gestation of pregnancy: Secondary | ICD-10-CM

## 2017-07-03 ENCOUNTER — Ambulatory Visit (INDEPENDENT_AMBULATORY_CARE_PROVIDER_SITE_OTHER): Payer: Self-pay | Admitting: Obstetrics and Gynecology

## 2017-07-03 ENCOUNTER — Other Ambulatory Visit: Payer: Self-pay

## 2017-07-03 VITALS — BP 109/63 | HR 98 | Wt 189.0 lb

## 2017-07-03 DIAGNOSIS — O0993 Supervision of high risk pregnancy, unspecified, third trimester: Secondary | ICD-10-CM

## 2017-07-03 DIAGNOSIS — Z789 Other specified health status: Secondary | ICD-10-CM

## 2017-07-03 DIAGNOSIS — O09523 Supervision of elderly multigravida, third trimester: Secondary | ICD-10-CM

## 2017-07-03 LAB — POCT URINALYSIS DIP (DEVICE)
Bilirubin Urine: NEGATIVE
Glucose, UA: NEGATIVE mg/dL
HGB URINE DIPSTICK: NEGATIVE
Ketones, ur: NEGATIVE mg/dL
NITRITE: NEGATIVE
PH: 7 (ref 5.0–8.0)
Protein, ur: NEGATIVE mg/dL
Specific Gravity, Urine: 1.025 (ref 1.005–1.030)
Urobilinogen, UA: 0.2 mg/dL (ref 0.0–1.0)

## 2017-07-03 NOTE — Patient Instructions (Addendum)

## 2017-07-04 NOTE — Progress Notes (Signed)
Prenatal Visit Note Date: 07/03/2017 Clinic: [facility]  Subjective:  Teresa Orozco is a 42 y.o. [id number] at 5274w1d being seen today for ongoing prenatal care.  [name] and has Pregnancy, supervision, high-risk; Advanced maternal age in multigravida 72>40; Grand multipara; Language barrier; and Genital warts complicating pregnancy on their problem list.  Patient reports no complaints.   Contractions: Not present. Vag. Bleeding: None.  Movement: Present. Denies leaking of fluid.   The following portions of the patient's history were reviewed and updated as appropriate: allergies, current medications, past family history, past medical history, past social history, past surgical history and problem list. Problem list updated.  Objective:   Vitals:   07/03/17 0958  BP: 109/63  Pulse: 98  Weight: 189 lb (85.7 kg)    Fetal Status: Fetal Heart Rate (bpm): 130   Movement: Present     General:  Alert, oriented and cooperative. Patient is in no acute distress.  Skin: Skin is warm and dry. No rash noted.   Cardiovascular: Normal heart rate noted  Respiratory: Normal respiratory effort, no problems with respiration noted  Abdomen: Soft, gravid, appropriate for gestational age. Pain/Pressure: Present     Pelvic:  Cervical exam deferred        Extremities: Normal range of motion.  Edema: None  Mental Status: Normal mood and affect. Normal behavior. Normal judgment and thought content.   Urinalysis:      Assessment and Plan:  Pregnancy: [id number] at 2774w1d  1. Language barrier Interpreter used  2. Supervision of high risk pregnancy in third trimester Routine care. Leaning towards depo. Pt has no insurance so no need to sign btl papers  3. Multigravida of advanced maternal age in third trimester Normal 6/13 growth, efw/ac. Has repeats ordered. Start ap testing at 36wks  Preterm labor symptoms and  general obstetric precautions including but not limited to vaginal bleeding, contractions, leaking of fluid and fetal movement were reviewed in detail with the patient. Please refer to After Visit Summary for other counseling recommendations.  Return in about 2 weeks (around 07/17/2017) for low risk ob.   Chrisney BingPickens, Rozlynn Lippold, MD

## 2017-07-16 ENCOUNTER — Other Ambulatory Visit: Payer: Self-pay | Admitting: Obstetrics and Gynecology

## 2017-07-20 ENCOUNTER — Encounter: Payer: Self-pay | Admitting: Obstetrics & Gynecology

## 2017-07-25 ENCOUNTER — Ambulatory Visit (HOSPITAL_COMMUNITY)
Admission: RE | Admit: 2017-07-25 | Discharge: 2017-07-25 | Disposition: A | Discharge status: Home / Self Care | Payer: Self-pay | Source: Ambulatory Visit | Attending: Obstetrics & Gynecology | Admitting: Obstetrics & Gynecology

## 2017-07-25 ENCOUNTER — Other Ambulatory Visit (HOSPITAL_COMMUNITY): Payer: Self-pay | Admitting: Maternal and Fetal Medicine

## 2017-07-25 ENCOUNTER — Encounter (HOSPITAL_COMMUNITY): Payer: Self-pay

## 2017-07-25 DIAGNOSIS — O09529 Supervision of elderly multigravida, unspecified trimester: Secondary | ICD-10-CM

## 2017-07-25 DIAGNOSIS — O09523 Supervision of elderly multigravida, third trimester: Secondary | ICD-10-CM

## 2017-07-25 DIAGNOSIS — Z3A36 36 weeks gestation of pregnancy: Secondary | ICD-10-CM | POA: Insufficient documentation

## 2017-07-25 DIAGNOSIS — Z362 Encounter for other antenatal screening follow-up: Secondary | ICD-10-CM

## 2017-07-30 ENCOUNTER — Ambulatory Visit (INDEPENDENT_AMBULATORY_CARE_PROVIDER_SITE_OTHER): Payer: Self-pay | Admitting: Obstetrics & Gynecology

## 2017-07-30 ENCOUNTER — Encounter: Payer: Self-pay | Admitting: Obstetrics & Gynecology

## 2017-07-30 VITALS — BP 108/72 | HR 108 | Wt 191.9 lb

## 2017-07-30 DIAGNOSIS — O98313 Other infections with a predominantly sexual mode of transmission complicating pregnancy, third trimester: Secondary | ICD-10-CM

## 2017-07-30 DIAGNOSIS — A630 Anogenital (venereal) warts: Secondary | ICD-10-CM

## 2017-07-30 DIAGNOSIS — O0993 Supervision of high risk pregnancy, unspecified, third trimester: Secondary | ICD-10-CM

## 2017-07-30 DIAGNOSIS — Z641 Problems related to multiparity: Secondary | ICD-10-CM

## 2017-07-30 DIAGNOSIS — O09523 Supervision of elderly multigravida, third trimester: Secondary | ICD-10-CM

## 2017-07-30 DIAGNOSIS — Z789 Other specified health status: Secondary | ICD-10-CM

## 2017-07-30 NOTE — Patient Instructions (Signed)
Induccin del trabajo de parto  (Labor Induction)  Se denomina induccin del trabajo de parto cuando se inician acciones para hacer que una mujer embarazada comience el trabajo de parto. La mayora de las mujeres comienzan el trabajo de parto sin ayuda entre las semanas 37 y 42 del embarazo. Cuando esto no ocurre o cuando hay una necesidad mdica, pueden utilizarse diferentes mtodos para inducirlo. La induccin del trabajo de parto hace que el tero se contraiga. Tambin hace que el cuello del tero se ablandemadure), se abra (se dilate), y se afine (se borre). Generalmente el trabajo de parto no se induce antes de las 39 semanas excepto que haya un problema con el beb o con la madre.  Antes de inducir el trabajo de parto, el mdico considerar cierto nmero de factores incluyendo los siguientes:   El estado del beb.   Cuntas semanas tiene de embarazo.   La madurez de los pulmones del beb.   El estado del cuello del tero.   La posicin del beb.  CULES SON LOS MOTIVOS PARA INDUCIR UN PARTO?  El trabajo de parto puede inducirse por las siguientes razones:   La salud del beb o de la madre estn en riesgo.   El embarazo se ha pasado de trmino en 1 semana o ms.   Ha roto la bolsa de aguas pero no se ha iniciado el trabajo de parto por s mismo.   La madre tiene algn trastorno de salud o una enfermedad grave, como hipertensin arterial, una infeccin, desprendimiento abrupto de la placenta o diabetes.   Hay escaso lquido amnitico alrededor del beb.   El beb presenta sufrimiento.  La conveniencia o el deseo de que el beb nazca en una cierta fecha no es un motivo para inducir el parto.  CULES SON LOS MTODOS UTILIZADOS PARA INDUCIR EL TRABAJO DE PARTO?  Algunos mtodos de induccin del trabajo de parto son:   Administracin del medicamentos prostaglandina. Este medicamento hace que el cuello uterino se dilate y madure. Este medicamento tambin iniciar las contracciones. Puede tomarse por  boca o insertarse en la vagina en forma de supositorio.   Insercin en la vagina de un tubo delgado (catter) con un baln en el extremo para dilatar el cuello del tero. Una vez insertado, el baln se infla con agua, lo que provoca la apertura del cuello del tero.   Ruptura de las membranas. El mdico separa el saco amnitico del cuello uterino, haciendo que el cuello uterino se distienda y cause la liberacin de la hormona llamada progesterona. Esto hace que el tero se contraiga. Este procedimiento se realiza durante una visita al consultorio mdico. Le indicarn que vuelva a su casa y espere que se inicien las contracciones. Luego tendr que volver para la induccin.   Ruptura de la bolsa de aguas. El mdico romper el saco amnitico con un pequeo instrumento. Una vez que el saco amnitico se rompe, las contracciones deben comenzar. Pueden pasar algunas horas hasta que haga efecto.   Medicamentos que desencadenen o intensifiquen las contracciones. Se lo administrarn a travs de un catter por va intravenosa (IV) que se inserta en una de las venas del brazo.  Todos los mtodos de induccin, excepto la ruptura de membranas, se realizan en el hospital. La induccin se realizar en el hospital, de modo que usted y el beb puedan ser controlados cuidadosamente.  CUNTO TIEMPO LLEVA INDUCIR EL TRABAJO DE PARTO?  Algunas inducciones pueden demorar entre 2 y 3 das. Generalmente lleva menos   tiempo, dependiendo del estado del cuello del tero. Puede tomar ms tiempo si la induccin se realiza en etapas tempranas del embarazo o es su primer embarazo. Si han pasado 2 o 3 das y no se inicia el trabajo de parto, podrn enviarla a su casa o realizar una cesrea.  CULES SON LOS RIESGOS ASOCIADOS CON LA INDUCCiN DEL TRABAJO DE PARTO?  Algunos de los riesgos de la induccin son:   Cambios en la frecuencia cardaca fetal, por ejemplo los latidos son demasiado rpidos, o lentos, o errticos.   Riesgo de distrs  fetal.   Posibilidad de infeccin en la madre o el beb.   Aumento de la posibilidad de que sea necesaria una cesrea.   Ruptura (abrupcin) de la placenta del tero (raro).   Ruptura uterina (muy raro).  Cuando es necesario realizar la induccin por razones mdicas, los beneficios deben superar a los riesgos.  CULES SON ALGUNAS RAZONES PARA NO INDUCIR EL TRABAJO DE PARTO?  La induccin no debe realizarse si:   Se demuestra que el beb no tolera el trabajo de parto.   Fue sometida anteriormente a cirugas en el tero, como una miomectoma o le han extirpado fibromas.   La placenta est en una posicin muy baja en el tero y obstruye la abertura del cuello (placenta previa).   El beb no est ubicado con la cabeza hacia bajo.   El cordn umbilical cae hacia el canal de parto, adelante del beb. Esto puede cortar el suministro de sangre y oxgeno al beb.   Fue sometida a una cesrea anteriormente.   Hay circunstancias poco habituales, como que el beb es extremadamente prematuro.  Esta informacin no tiene como fin reemplazar el consejo del mdico. Asegrese de hacerle al mdico cualquier pregunta que tenga.  Document Released: 04/11/2007 Document Revised: 04/26/2015 Document Reviewed: 08/01/2012  Elsevier Interactive Patient Education  2017 Elsevier Inc.

## 2017-07-30 NOTE — Progress Notes (Signed)
   PRENATAL VISIT NOTE  Subjective:  Cheron Schaumannna Weyandt is a 42 y.o. [id number] at 3860w0d being seen today for ongoing prenatal care.  [name] and has Pregnancy, supervision, high-risk; Advanced maternal age in multigravida 81>40; Grand multipara; Language barrier; and Genital warts complicating pregnancy on their problem list.  Patient reports having a bug bite on her belly. It is improving. .  Contractions: Not present. Vag. Bleeding: None.  Movement: Present. Denies leaking of fluid.   The following portions of the patient's history were reviewed and updated as appropriate: allergies, current medications, past family history, past medical history, past social history, past surgical history and problem list. Problem list updated.  Objective:   Vitals:   07/30/17 1322  BP: 108/72  Pulse: (!) 108  Weight: 191 lb 14.4 oz (87 kg)    Fetal Status: Fetal Heart Rate (bpm): 152   Movement: Present     General:  Alert, oriented and cooperative. Patient is in no acute distress.  Skin: Skin is warm and dry. No rash noted.   Cardiovascular: Normal heart rate noted  Respiratory: Normal respiratory effort, no problems with respiration noted  Abdomen: Soft, gravid, appropriate for gestational age.  Pain/Pressure: Present     Pelvic: Cervical exam deferred        Extremities: Normal range of motion.  Edema: Trace  Mental Status: Normal mood and affect. Normal behavior. Normal judgment and thought content.   Assessment and Plan:  Pregnancy: [id number] at 9360w0d  1. Supervision of high risk pregnancy in third trimester  2. Multigravida of advanced maternal age in third trimester Pt was supposed to begin antenatal testing.  S/p BPP 8/8 07/25/2017 IOL at 39 weeks  3. Language barrier Spanish  4. Grand multipara  5. 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 Please refer to After Visit Summary for other counseling recommendations.  Return in about 1 week (around 08/06/2017).  No future appointments.  Willodean Rosenthalarolyn Harraway-Smith, MD

## 2017-07-30 NOTE — Progress Notes (Signed)
Bitten 2 days ago by "something" and has a blister on Lower stomach says its has broken out with hives and is itchy. Wants to talk about induction Per last conversation on last appointment.

## 2017-08-01 ENCOUNTER — Ambulatory Visit: Payer: Self-pay

## 2017-08-01 ENCOUNTER — Ambulatory Visit (INDEPENDENT_AMBULATORY_CARE_PROVIDER_SITE_OTHER): Payer: Self-pay | Admitting: *Deleted

## 2017-08-01 DIAGNOSIS — O09523 Supervision of elderly multigravida, third trimester: Secondary | ICD-10-CM

## 2017-08-01 NOTE — Progress Notes (Addendum)
Pt informed that the ultrasound is considered a limited OB ultrasound and is not intended to be a complete ultrasound exam.  Patient also informed that the ultrasound is not being completed with the intent of assessing for fetal or placental anomalies or any pelvic abnormalities.  Explained that the purpose of today's ultrasound is to assess for presentation, BPP and amniotic fluid volume.  Patient acknowledges the purpose of the exam and the limitations of the study.    Attestation of Attending Supervision of RN: Evaluation and management procedures were performed by the nurse under my supervision and collaboration.  I have reviewed the nursing note and chart, and I agree with the management and plan.  Carolyn L. Harraway-Smith, M.D., FACOG   

## 2017-08-02 ENCOUNTER — Encounter: Payer: Self-pay | Admitting: Obstetrics and Gynecology

## 2017-08-08 ENCOUNTER — Ambulatory Visit: Payer: Self-pay

## 2017-08-08 ENCOUNTER — Ambulatory Visit (INDEPENDENT_AMBULATORY_CARE_PROVIDER_SITE_OTHER): Payer: Self-pay | Admitting: Obstetrics and Gynecology

## 2017-08-08 ENCOUNTER — Telehealth: Payer: Self-pay

## 2017-08-08 ENCOUNTER — Ambulatory Visit (INDEPENDENT_AMBULATORY_CARE_PROVIDER_SITE_OTHER): Payer: Self-pay | Admitting: *Deleted

## 2017-08-08 ENCOUNTER — Encounter: Payer: Self-pay | Admitting: Obstetrics and Gynecology

## 2017-08-08 VITALS — BP 109/67 | HR 99 | Wt 193.4 lb

## 2017-08-08 DIAGNOSIS — O09523 Supervision of elderly multigravida, third trimester: Secondary | ICD-10-CM

## 2017-08-08 DIAGNOSIS — Z641 Problems related to multiparity: Secondary | ICD-10-CM

## 2017-08-08 DIAGNOSIS — O0993 Supervision of high risk pregnancy, unspecified, third trimester: Secondary | ICD-10-CM

## 2017-08-08 DIAGNOSIS — Z113 Encounter for screening for infections with a predominantly sexual mode of transmission: Secondary | ICD-10-CM

## 2017-08-08 DIAGNOSIS — Z789 Other specified health status: Secondary | ICD-10-CM

## 2017-08-08 LAB — OB RESULTS CONSOLE GC/CHLAMYDIA: Gonorrhea: NEGATIVE

## 2017-08-08 LAB — OB RESULTS CONSOLE GBS: GBS: NEGATIVE

## 2017-08-08 NOTE — Progress Notes (Signed)

## 2017-08-08 NOTE — Telephone Encounter (Signed)
Called pt usingPacific Spanish Interpreter Lisbeth id# [phone] to advise pt of Induction date & time of 08/14/17 @ 6:30am. No answer, left message on VM.

## 2017-08-08 NOTE — Progress Notes (Signed)
   PRENATAL VISIT NOTE  Subjective:  Teresa Orozco is a 42 y.o. [id number] at 1444w2d being seen today for ongoing prenatal care.  [name] and has Pregnancy, supervision, high-risk; Advanced maternal age in multigravida 44>40; Grand multipara; Language barrier; and Genital warts complicating pregnancy on their problem list.  Patient reports no complaints.  Contractions: Not present. Vag. Bleeding: None.  Movement: Present. Denies leaking of fluid.   The following portions of the patient's history were reviewed and updated as appropriate: allergies, current medications, past family history, past medical history, past social history, past surgical history and problem list. Problem list updated.  Objective:   Vitals:   08/08/17 1107  BP: 109/67  Pulse: 99  Weight: 193 lb 6.4 oz (87.7 kg)    Fetal Status: Fetal Heart Rate (bpm): RNST   Movement: Present     General:  Alert, oriented and cooperative. Patient is in no acute distress.  Skin: Skin is warm and dry. No rash noted.   Cardiovascular: Normal heart rate noted  Respiratory: Normal respiratory effort, no problems with respiration noted  Abdomen: Soft, gravid, appropriate for gestational age.  Pain/Pressure: Present     Pelvic: Cervical exam performed      3/60/-1  Extremities: Normal range of motion.     Mental Status: Normal mood and affect. Normal behavior. Normal judgment and thought content.   Assessment and Plan:  Pregnancy: [id number] at 3244w2d  1. Supervision of high risk pregnancy in third trimester  2. Multigravida of advanced maternal age in third trimester Patient previously was told she would be induced at 39 weeks, I reviewed recommendations for induction at 40 weeks given her age and no other risk factors. However, as she is grand multip and has favorable cervix, reviewed that elective induction at 39 weeks could be considered. Had long conversation with  patient about this via interpretor, after reviewing risks/benefits of elective induction and recommendation for induction at 40 weeks, patient declines to wait until 40 weeks as she is worried something would happen to fetus. IOL scheduled for 39 weeks  3. Grand multipara  4. Language barrier Spanish speaking, interpretor used  Term labor symptoms and general obstetric precautions including but not limited to vaginal bleeding, contractions, leaking of fluid and fetal movement were reviewed in detail with the patient. Please refer to After Visit Summary for other counseling recommendations.  Return in about 1 week (around 08/15/2017) for OB visit.  Future Appointments  Date Time Provider Department Center  08/14/2017  6:30 AM [facility]-BSS[facility]ED ROOM [facility]SS[facility]ED None  08/16/2017  2:15 PM [facility] NST [facility] WOC  08/16/2017  3:15 PM [place] BingPickens, Charlie, [facility]    Conan BowensKelly M Adysson Revelle, MD

## 2017-08-09 ENCOUNTER — Telehealth (HOSPITAL_COMMUNITY): Payer: Self-pay | Admitting: *Deleted

## 2017-08-09 ENCOUNTER — Encounter (HOSPITAL_COMMUNITY): Payer: Self-pay | Admitting: *Deleted

## 2017-08-09 LAB — GC/CHLAMYDIA PROBE AMP (~~LOC~~) NOT AT ARMC
Chlamydia: NEGATIVE
Neisseria Gonorrhea: NEGATIVE

## 2017-08-09 NOTE — Telephone Encounter (Signed)
Interpreter number [phone] Preadmission screen

## 2017-08-10 ENCOUNTER — Other Ambulatory Visit: Payer: Self-pay | Admitting: Advanced Practice Midwife

## 2017-08-12 LAB — CULTURE, BETA STREP (GROUP B ONLY): Strep Gp B Culture: NEGATIVE

## 2017-08-14 ENCOUNTER — Other Ambulatory Visit: Payer: Self-pay

## 2017-08-14 ENCOUNTER — Inpatient Hospital Stay (HOSPITAL_COMMUNITY): Payer: Medicaid Other | Admitting: Anesthesiology

## 2017-08-14 ENCOUNTER — Inpatient Hospital Stay (HOSPITAL_COMMUNITY)
Admission: RE | Admit: 2017-08-14 | Discharge: 2017-08-16 | DRG: 807 | Disposition: A | Discharge status: Home / Self Care | Payer: Medicaid Other | Attending: Obstetrics & Gynecology | Admitting: Obstetrics & Gynecology

## 2017-08-14 ENCOUNTER — Encounter (HOSPITAL_COMMUNITY): Payer: Self-pay

## 2017-08-14 DIAGNOSIS — Z641 Problems related to multiparity: Secondary | ICD-10-CM

## 2017-08-14 DIAGNOSIS — O09529 Supervision of elderly multigravida, unspecified trimester: Secondary | ICD-10-CM

## 2017-08-14 DIAGNOSIS — Z789 Other specified health status: Secondary | ICD-10-CM | POA: Diagnosis present

## 2017-08-14 DIAGNOSIS — A630 Anogenital (venereal) warts: Secondary | ICD-10-CM | POA: Diagnosis present

## 2017-08-14 DIAGNOSIS — O98313 Other infections with a predominantly sexual mode of transmission complicating pregnancy, third trimester: Secondary | ICD-10-CM

## 2017-08-14 DIAGNOSIS — O26893 Other specified pregnancy related conditions, third trimester: Secondary | ICD-10-CM | POA: Diagnosis present

## 2017-08-14 DIAGNOSIS — Z3A39 39 weeks gestation of pregnancy: Secondary | ICD-10-CM

## 2017-08-14 DIAGNOSIS — O9832 Other infections with a predominantly sexual mode of transmission complicating childbirth: Principal | ICD-10-CM | POA: Diagnosis present

## 2017-08-14 DIAGNOSIS — O09523 Supervision of elderly multigravida, third trimester: Secondary | ICD-10-CM

## 2017-08-14 LAB — TYPE AND SCREEN
ABO/RH(D): A POS
Antibody Screen: NEGATIVE

## 2017-08-14 LAB — CBC
HEMATOCRIT: 37.1 % (ref 36.0–46.0)
Hemoglobin: 12.5 g/dL (ref 12.0–15.0)
MCH: 31.1 pg (ref 26.0–34.0)
MCHC: 33.7 g/dL (ref 30.0–36.0)
MCV: 92.3 fL (ref 78.0–100.0)
Platelets: 285 10*3/uL (ref 150–400)
RBC: 4.02 MIL/uL (ref 3.87–5.11)
RDW: 15.7 % — AB (ref 11.5–15.5)
WBC: 11.3 10*3/uL — ABNORMAL HIGH (ref 4.0–10.5)

## 2017-08-14 LAB — RPR: RPR: NONREACTIVE

## 2017-08-14 MED ORDER — FENTANYL 2.5 MCG/ML BUPIVACAINE 1/10 % EPIDURAL INFUSION (WH - ANES)
14.0000 mL/h | INTRAMUSCULAR | Status: DC | PRN
  Administered 2017-08-14 (×2): 14 mL/h via EPIDURAL
  Filled 2017-08-14 (×2): qty 100

## 2017-08-14 MED ORDER — TERBUTALINE SULFATE 1 MG/ML IJ SOLN
0.2500 mg | Freq: Once | INTRAMUSCULAR | Status: DC | PRN
  Filled 2017-08-14: qty 1

## 2017-08-14 MED ORDER — EPHEDRINE 5 MG/ML INJ
10.0000 mg | INTRAVENOUS | Status: DC | PRN
  Filled 2017-08-14: qty 2

## 2017-08-14 MED ORDER — SODIUM CHLORIDE 0.9% FLUSH: 3.0000 mL | INTRAVENOUS | Status: DC | PRN

## 2017-08-14 MED ORDER — MEASLES, MUMPS & RUBELLA VAC ~~LOC~~ INJ
0.5000 mL | INJECTION | Freq: Once | SUBCUTANEOUS | Status: DC
  Filled 2017-08-14: qty 0.5

## 2017-08-14 MED ORDER — OXYTOCIN BOLUS FROM INFUSION
500.0000 mL | Freq: Once | INTRAVENOUS | Status: AC
  Administered 2017-08-14: 500 mL via INTRAVENOUS

## 2017-08-14 MED ORDER — SODIUM CHLORIDE 0.9 % IV SOLN: 250.0000 mL | INTRAVENOUS | Status: DC | PRN

## 2017-08-14 MED ORDER — OXYTOCIN 40 UNITS IN LACTATED RINGERS INFUSION - SIMPLE MED
1.0000 m[IU]/min | INTRAVENOUS | Status: DC
  Administered 2017-08-14: 4 m[IU]/min via INTRAVENOUS
  Administered 2017-08-14: 14 m[IU]/min via INTRAVENOUS
  Administered 2017-08-14 (×2): 10 m[IU]/min via INTRAVENOUS
  Administered 2017-08-14: 6 m[IU]/min via INTRAVENOUS
  Administered 2017-08-14: 12 m[IU]/min via INTRAVENOUS
  Administered 2017-08-14: 2 m[IU]/min via INTRAVENOUS
  Administered 2017-08-14 (×2): 8 m[IU]/min via INTRAVENOUS
  Administered 2017-08-14: 12 m[IU]/min via INTRAVENOUS
  Filled 2017-08-14: qty 1000

## 2017-08-14 MED ORDER — WITCH HAZEL-GLYCERIN EX PADS: 1.0000 "application " | MEDICATED_PAD | CUTANEOUS | Status: DC | PRN

## 2017-08-14 MED ORDER — LIDOCAINE HCL (PF) 1 % IJ SOLN
30.0000 mL | INTRAMUSCULAR | Status: DC | PRN
  Filled 2017-08-14: qty 30

## 2017-08-14 MED ORDER — ONDANSETRON HCL 4 MG/2ML IJ SOLN: 4.0000 mg | Freq: Four times a day (QID) | INTRAMUSCULAR | Status: DC | PRN

## 2017-08-14 MED ORDER — BISACODYL 10 MG RE SUPP: 10.0000 mg | Freq: Every day | RECTAL | Status: DC | PRN

## 2017-08-14 MED ORDER — PRENATAL MULTIVITAMIN CH
1.0000 | ORAL_TABLET | Freq: Every day | ORAL | Status: DC
  Administered 2017-08-15 – 2017-08-16 (×2): 1 via ORAL
  Filled 2017-08-14 (×2): qty 1

## 2017-08-14 MED ORDER — BENZOCAINE-MENTHOL 20-0.5 % EX AERO: 1.0000 "application " | INHALATION_SPRAY | CUTANEOUS | Status: DC | PRN

## 2017-08-14 MED ORDER — OXYCODONE-ACETAMINOPHEN 5-325 MG PO TABS: 2.0000 | ORAL_TABLET | ORAL | Status: DC | PRN

## 2017-08-14 MED ORDER — DIBUCAINE 1 % RE OINT: 1.0000 "application " | TOPICAL_OINTMENT | RECTAL | Status: DC | PRN

## 2017-08-14 MED ORDER — ONDANSETRON HCL 4 MG/2ML IJ SOLN: 4.0000 mg | INTRAMUSCULAR | Status: DC | PRN

## 2017-08-14 MED ORDER — ACETAMINOPHEN 325 MG PO TABS: 650.0000 mg | ORAL_TABLET | ORAL | Status: DC | PRN

## 2017-08-14 MED ORDER — SODIUM CHLORIDE 0.9% FLUSH: 3.0000 mL | Freq: Two times a day (BID) | INTRAVENOUS | Status: DC

## 2017-08-14 MED ORDER — COCONUT OIL OIL: 1.0000 "application " | TOPICAL_OIL | Status: DC | PRN

## 2017-08-14 MED ORDER — OXYTOCIN 40 UNITS IN LACTATED RINGERS INFUSION - SIMPLE MED
2.5000 [IU]/h | INTRAVENOUS | Status: DC
  Administered 2017-08-14: 2.5 [IU]/h via INTRAVENOUS

## 2017-08-14 MED ORDER — IBUPROFEN 600 MG PO TABS
600.0000 mg | ORAL_TABLET | Freq: Four times a day (QID) | ORAL | Status: DC
  Administered 2017-08-15 – 2017-08-16 (×7): 600 mg via ORAL
  Filled 2017-08-14 (×7): qty 1

## 2017-08-14 MED ORDER — MISOPROSTOL 200 MCG PO TABS
ORAL_TABLET | ORAL | Status: AC
  Filled 2017-08-14: qty 4

## 2017-08-14 MED ORDER — ZOLPIDEM TARTRATE 5 MG PO TABS: 5.0000 mg | ORAL_TABLET | Freq: Every evening | ORAL | Status: DC | PRN

## 2017-08-14 MED ORDER — LACTATED RINGERS IV SOLN
500.0000 mL | Freq: Once | INTRAVENOUS | Status: AC
  Administered 2017-08-14: 500 mL via INTRAVENOUS

## 2017-08-14 MED ORDER — PHENYLEPHRINE 40 MCG/ML (10ML) SYRINGE FOR IV PUSH (FOR BLOOD PRESSURE SUPPORT)
80.0000 ug | PREFILLED_SYRINGE | INTRAVENOUS | Status: DC | PRN
  Filled 2017-08-14: qty 10
  Filled 2017-08-14: qty 5

## 2017-08-14 MED ORDER — TETANUS-DIPHTH-ACELL PERTUSSIS 5-2.5-18.5 LF-MCG/0.5 IM SUSP: 0.5000 mL | Freq: Once | INTRAMUSCULAR | Status: DC

## 2017-08-14 MED ORDER — OXYCODONE-ACETAMINOPHEN 5-325 MG PO TABS: 1.0000 | ORAL_TABLET | ORAL | Status: DC | PRN

## 2017-08-14 MED ORDER — ONDANSETRON HCL 4 MG PO TABS: 4.0000 mg | ORAL_TABLET | ORAL | Status: DC | PRN

## 2017-08-14 MED ORDER — LACTATED RINGERS IV SOLN: 500.0000 mL | INTRAVENOUS | Status: DC | PRN

## 2017-08-14 MED ORDER — FLEET ENEMA 7-19 GM/118ML RE ENEM: 1.0000 | ENEMA | Freq: Every day | RECTAL | Status: DC | PRN

## 2017-08-14 MED ORDER — SOD CITRATE-CITRIC ACID 500-334 MG/5ML PO SOLN: 30.0000 mL | ORAL | Status: DC | PRN

## 2017-08-14 MED ORDER — PHENYLEPHRINE 40 MCG/ML (10ML) SYRINGE FOR IV PUSH (FOR BLOOD PRESSURE SUPPORT)
80.0000 ug | PREFILLED_SYRINGE | INTRAVENOUS | Status: DC | PRN
  Filled 2017-08-14: qty 5

## 2017-08-14 MED ORDER — SENNOSIDES-DOCUSATE SODIUM 8.6-50 MG PO TABS
2.0000 | ORAL_TABLET | ORAL | Status: DC
  Administered 2017-08-15 (×2): 2 via ORAL
  Filled 2017-08-14 (×2): qty 2

## 2017-08-14 MED ORDER — DIPHENHYDRAMINE HCL 25 MG PO CAPS: 25.0000 mg | ORAL_CAPSULE | Freq: Four times a day (QID) | ORAL | Status: DC | PRN

## 2017-08-14 MED ORDER — DIPHENHYDRAMINE HCL 50 MG/ML IJ SOLN: 12.5000 mg | INTRAMUSCULAR | Status: DC | PRN

## 2017-08-14 MED ORDER — MISOPROSTOL 200 MCG PO TABS
1000.0000 ug | ORAL_TABLET | Freq: Once | ORAL | Status: AC
  Administered 2017-08-14: 800 ug via RECTAL

## 2017-08-14 MED ORDER — LACTATED RINGERS IV SOLN
INTRAVENOUS | Status: DC
  Administered 2017-08-14 (×2): via INTRAVENOUS

## 2017-08-14 MED ORDER — SIMETHICONE 80 MG PO CHEW: 80.0000 mg | CHEWABLE_TABLET | ORAL | Status: DC | PRN

## 2017-08-14 MED ORDER — LIDOCAINE HCL (PF) 1 % IJ SOLN
INTRAMUSCULAR | Status: DC | PRN
  Administered 2017-08-14 (×2): 6 mL via EPIDURAL

## 2017-08-14 NOTE — Anesthesia Pain Management Evaluation Note (Signed)
  [facility] Pain Management Visit Note  Patient: Teresa SchaumannAna Mignano, 42 y.o., female  "Hello [facility] am [facility]. We have an anesthesia team available at all times to provide care throughout the hospital, including epidural management and anesthesia for C-section. I don't know your plan for the delivery whether it a natural birth, water birth, IV sedation, nitrous supplementation, doula or epidural, but we want to meet your pain goals."   1.Was your pain managed to your expectations on prior hospitalizations?   Yes   2.What is your expectation for pain management during this hospitalization?     Epidural  3.How can we help you reach that goal? Planning on epidural  Record the patient's initial score and the patient's pain goal.   Pain: 0  Pain Goal: 4 [facility] wants you to be able to say your pain was always managed very well.  [name] 08/14/2017

## 2017-08-14 NOTE — H&P (Signed)
LABOR AND DELIVERY ADMISSION HISTORY AND PHYSICAL NOTE  Teresa Orozco is a 42 y.o. female [phone] with IUP at [redacted]w[redacted]d by LMP presenting for IOL. Pregnancy complicated by AMA, grand multiparity, and genital warts. Pt counseled on IOL at 40 weeks in the office d/t AMA, but given patient's concerns, elective induction at 39 weeks.  She reports positive fetal movement. She denies leakage of fluid or vaginal bleeding.  Prenatal History/Complications: PNC at [facility] Pregnancy complications:  Patient Active Problem List   Diagnosis Date Noted  . Grand multipara 04/23/2017  . Language barrier 04/23/2017  . Genital warts complicating pregnancy 04/23/2017  . Advanced maternal age in multigravida >40 07/26/2015  . Pregnancy, supervision, high-risk 01/14/2015     Past Medical History: Past Medical History:  Diagnosis Date  . Medical history non-contributory     Past Surgical History: Past Surgical History:  Procedure Laterality Date  . DILATION AND CURETTAGE OF UTERUS  2008    Obstetrical History: OB History    Gravida  9   Para  5   Term  5   Preterm      AB  3   Living  5     SAB  3   TAB      Ectopic      Multiple  0   Live Births  5           Social History: Social History   Socioeconomic History  . Marital status: Married    Spouse name: Not on file  . Number of children: Not on file  . Years of education: Not on file  . Highest education level: Not on file  Occupational History  . Not on file  Social Needs  . Financial resource strain: Not on file  . Food insecurity:    Worry: Not on file    Inability: Not on file  . Transportation needs:    Medical: Not on file    Non-medical: Not on file  Tobacco Use  . Smoking status: Never Smoker  . Smokeless tobacco: Never Used  Substance and Sexual Activity  . Alcohol use: No  . Drug use: No  . Sexual activity: Yes    Birth control/protection: None  Lifestyle  . Physical activity:    Days per week: Not  on file    Minutes per session: Not on file  . Stress: Not on file  Relationships  . Social connections:    Talks on phone: Not on file    Gets together: Not on file    Attends religious service: Not on file    Active member of club or organization: Not on file    Attends meetings of clubs or organizations: Not on file    Relationship status: Not on file  Other Topics Concern  . Not on file  Social History Narrative  . Not on file    Family History: No family history on file.  Allergies: No Known Allergies  Medications Prior to Admission  Medication Sig Dispense Refill Last Dose  . aspirin EC 81 MG tablet Take 1 tablet (81 mg total) by mouth daily. Take after 12 weeks for prevention of preeclampsia later in pregnancy (Patient not taking: Reported on 08/08/2017) 300 tablet 2 Not Taking  . Prenatal Multivit-Min-Fe-FA (PRENATAL VITAMINS) 0.8 MG tablet Take 1 tablet by mouth daily. 30 tablet 12 Taking  . Prenatal Vit-Fe Fumarate-FA (PRENATABS FA) 29-1 MG TABS TAKE 1 TABLET BY MOUTH DAILY. (Patient not taking: Reported on 08/08/2017)  30 tablet 2 Not Taking     Review of Systems  All systems reviewed and negative except as stated in HPI  Physical Exam Last menstrual period 11/04/2016, currently breastfeeding. General appearance: alert, oriented, NAD Lungs: normal respiratory effort Heart: regular rate Abdomen: soft, non-tender; gravid, FH appropriate for GA Extremities: No calf swelling or tenderness  Fetal monitoring: baseline rate 150, moderate variability, +acel, no  decel Uterine activity: UI    Prenatal labs: ABO, Rh: A/Positive/-- (01/08 16100952) Antibody: Negative (01/08 0952) Rubella: 2.49 (01/08 0952) RPR: Non Reactive (05/06 0930)  HBsAg: Negative (01/08 0952)  HIV: Non Reactive (05/06 0930)  GC/Chlamydia: negative GBS:   negative 2-hr GTT: normal Genetic screening:  declined Anatomy US: UTD, resolved  Prenatal Transfer Tool  Maternal Diabetes: No Genetic  Screening: Declined Maternal Ultrasounds/Referrals: Abnormal:  Findings:   Fetal Kidney Anomalies, resolved Fetal Ultrasounds or other Referrals:  None Maternal Substance Abuse:  No Significant Maternal Medications:  None Significant Maternal Lab Results: Lab values include: Group B Strep negative  No results found for this or any previous visit (from the past 24 hour(s)).  Patient Active Problem List   Diagnosis Date Noted  . Grand multipara 04/23/2017  . Language barrier 04/23/2017  . Genital warts complicating pregnancy 04/23/2017  . Advanced maternal age in multigravida >40 07/26/2015  . Pregnancy, supervision, high-risk 01/14/2015    Assessment: Teresa Orozco is a 42 y.o. [id number] at 1927w1d and pregnancy c/b AMA here for elective IOL at 30 weks  #Labor: SVE 3/60%/-1 in office last week. Plan to start IV Pitocin for IOL #FWB: Cat I #ID:  GBS neg #MOF: both breast and bottle #MOC:IUD #Circ:  no  Kandra NicolasJulie P Nikira Kushnir 08/14/2017, 7:20 AM

## 2017-08-14 NOTE — Anesthesia Procedure Notes (Signed)
Epidural Patient location during procedure: OB Start time: 08/14/2017 12:25 PM End time: 08/14/2017 12:28 PM  Staffing Anesthesiologist: Leilani AbleHatchett, Lenox Ladouceur, MD Performed: anesthesiologist   Preanesthetic Checklist Completed: patient identified, site marked, surgical consent, pre-op evaluation, timeout performed, IV checked, risks and benefits discussed and monitors and equipment checked  Epidural Patient position: sitting Prep: site prepped and draped and DuraPrep Patient monitoring: continuous pulse ox and blood pressure Approach: midline Location: L3-L4 Injection technique: LOR air  Needle:  Needle type: Tuohy  Needle gauge: 17 G Needle length: 9 cm and 9 Needle insertion depth: 5 cm cm Catheter type: closed end flexible Catheter size: 19 Gauge Catheter at skin depth: 10 cm Test dose: negative and Other  Assessment Sensory level: T9 Events: blood not aspirated, injection not painful, no injection resistance, negative IV test and no paresthesia  Additional Notes Reason for block:procedure for pain

## 2017-08-14 NOTE — Progress Notes (Signed)
Patient ID: Teresa SchaumannAna Orozco, female   DOB: 09/13/1975, 42 y.o.   MRN: [id number]  AROM performed @ 1830. Light meconium fluid.  Having discomfort with contractions.  Patient has epidural. Pitocin 7414mu/min.    FHT: 140bpm. Moderate variability. Present acels. No decels.   BP 110/69   Pulse 93   Temp 99.2 F (37.3 C) (Oral)   Resp 16   Ht 5\' 5"  (1.651 m)   Wt 87.4 kg (192 lb 9.6 oz)   LMP 11/04/2016 (Approximate)   SpO2 98%   BMI 32.05 kg/m   Dilation: 8 Effacement (%): 80 Cervical Position: Middle Station: -1 Presentation: Vertex Exam by:: Marius Ditchaniell Wade, RN

## 2017-08-14 NOTE — Anesthesia Preprocedure Evaluation (Signed)
Anesthesia Evaluation  Patient identified by MRN, date of birth, ID band Patient awake    Reviewed: Allergy & Precautions, H&P , NPO status , Patient's Chart, lab work & pertinent test results  Airway Mallampati: II  TM Distance: >3 FB Neck ROM: full    Dental no notable dental hx. (+) Teeth Intact   Pulmonary neg pulmonary ROS,    Pulmonary exam normal breath sounds clear to auscultation       Cardiovascular negative cardio ROS Normal cardiovascular exam Rhythm:regular Rate:Normal     Neuro/Psych negative neurological ROS  negative psych ROS   GI/Hepatic negative GI ROS, Neg liver ROS,   Endo/Other  negative endocrine ROS  Renal/GU negative Renal ROS  negative genitourinary   Musculoskeletal negative musculoskeletal ROS (+)   Abdominal (+) + obese,   Peds  Hematology negative hematology ROS (+)   Anesthesia Other Findings   Reproductive/Obstetrics (+) Pregnancy                             Anesthesia Physical Anesthesia Plan  ASA: II  Anesthesia Plan: Epidural   Post-op Pain Management:    Induction:   PONV Risk Score and Plan:   Airway Management Planned:   Additional Equipment:   Intra-op Plan:   Post-operative Plan:   Informed Consent: I have reviewed the patients History and Physical, chart, labs and discussed the procedure including the risks, benefits and alternatives for the proposed anesthesia with the patient or authorized representative who has indicated his/her understanding and acceptance.       Plan Discussed with:   Anesthesia Plan Comments:         Anesthesia Quick Evaluation  

## 2017-08-14 NOTE — Progress Notes (Signed)
Entered room and patient had given infant a bottle.  Feeding was 45 ml.

## 2017-08-14 NOTE — Progress Notes (Signed)
Patient ID: Teresa SchaumannAna Duerson, female   DOB: 09/23/1975, 42 y.o.   MRN: [id number]  Introduced myself to patient with interpreter in room.  Patient has no complaints.  States her contractions are now more painful, 6/10 on pain scale.  States she would like an epidural.    Patient is getting elective IOL.  Complicated by AMA, grand multiparity, and genital warts. .  Is on pitocin 12 mU/min. Membranes intact. GBS neg.    FHT: 140 bpm.  Mod variability.  Present accels. No decels. Cat. I.      BP 118/75   Pulse 97   Temp 98.6 F (37 C) (Oral)   Resp 20   Ht 5\' 5"  (1.651 m)   Wt 87.4 kg (192 lb 9.6 oz)   LMP 11/04/2016 (Approximate)   BMI 32.05 kg/m   Dilation: 4 Effacement (%): 50 Station: Ballotable Presentation: Vertex Exam by:: katherine g jones RN

## 2017-08-15 NOTE — Progress Notes (Addendum)
POSTPARTUM PROGRESS NOTE  Post Partum Day 1 Subjective:  Teresa Orozco is a 42 y.o. [id number] 5350w1d s/p NSVD.  No acute events overnight.  Pt denies problems with ambulating, voiding or po intake.  She denies nausea or vomiting.  Pain is well controlled.  She has not had flatus. She has not had bowel movement.  Lochia Small.   Objective: Blood pressure 95/61, pulse 77, temperature 97.8 F (36.6 C), temperature source Oral, resp. rate 17, height 5\' 5"  (1.651 m), weight 87.4 kg (192 lb 9.6 oz), last menstrual period 11/04/2016, SpO2 98 %, unknown if currently breastfeeding.  Physical Exam:  General: alert, cooperative and no distress Lochia:normal flow Chest: no respiratory distress Heart:regular rate, distal pulses intact Abdomen: soft, nontender,  Uterine Fundus: firm, appropriately tender DVT Evaluation: No calf swelling or tenderness Extremities: No edema  Recent Labs    08/14/17 0747  HGB 12.5  HCT 37.1     Assessment/Plan:  ASSESSMENT: Teresa Orozco is a 42 y.o. [id number] 5850w1d s/p NSVD.   Plan for discharge tomorrow and Contraception IUD   LOS: 1 day   Rolland BimlerLexi J Betancourt MS3 08/15/2017, 7:28 AM   Midwife attestation Post Partum Day 1 I have seen and examined this patient and agree with above documentation in the student's note.   Teresa Orozco is a 42 y.o. [id number] s/p SVD.  Pt denies problems with ambulating, voiding or po intake. Pain is well controlled. Method of Feeding: bottle  PE:  Gen: well appearing Heart: reg rate Lungs: normal WOB Fundus firm Ext: soft, no pain, no edema  Assessment: S/p SVD PPD #1  Plan for discharge: tomorrow  Live interpreter present for encounter  Donette LarryMelanie Bassy Fetterly, CNM 1:17 PM

## 2017-08-15 NOTE — Progress Notes (Signed)
Admission paperwork to unit completed with use of interpreter. Interpreter ID [phone].  Tylene FantasiaBelinda Cindee Mclester, RN 08/15/17 12:05am

## 2017-08-15 NOTE — Anesthesia Postprocedure Evaluation (Signed)
Anesthesia Post Note  Patient: Akila Steinke  Procedure(s) Performed: AN AD HOC LABOR EPIDURAL     Patient location during evaluation: Mother Baby Anesthesia Type: Epidural Level of consciousness: awake and alert Pain management: pain level controlled Vital Signs Assessment: post-procedure vital signs reviewed and stable Respiratory status: spontaneous breathing Cardiovascular status: stable Postop Assessment: no headache, patient able to bend at knees, no backache, no apparent nausea or vomiting, epidural receding, adequate PO intake and able to ambulate Anesthetic complications: no    Last Vitals:  Vitals:   08/15/17 0015 08/15/17 0433  BP: 109/67 95/61  Pulse: 93 77  Resp: 20 17  Temp: 36.6 C 36.6 C  SpO2:      Last Pain:  Vitals:   08/15/17 0710  TempSrc:   PainSc: 0-No pain   Pain Goal:                 Salome ArntSterling, Hovanes Hymas Marie

## 2017-08-16 ENCOUNTER — Other Ambulatory Visit: Payer: Self-pay

## 2017-08-16 ENCOUNTER — Encounter: Payer: Self-pay | Admitting: Obstetrics and Gynecology

## 2017-08-16 NOTE — Progress Notes (Signed)
POSTPARTUM PROGRESS NOTE  Post Partum Day 2 Subjective:  Cheron Schaumannna Pollett is a 42 y.o. [id number] 8029w1d s/p SVD following elective IOL.  No acute events overnight.  Pt denies problems with ambulating, voiding or po intake.  She denies nausea or vomiting.  Pain is well controlled.  Lochia Minimal.   Objective: Blood pressure 116/72, pulse 73, temperature 98.1 F (36.7 C), temperature source Oral, resp. rate 16, height 5\' 5"  (1.651 m), weight 87.4 kg (192 lb 9.6 oz), last menstrual period 11/04/2016, SpO2 99 %, unknown if currently breastfeeding.  Physical Exam:  General: alert, cooperative and no distress Lochia:normal flow Chest: no respiratory distress Heart:regular rate, distal pulses intact Abdomen: soft, nontender,  Uterine Fundus: firm, appropriately tender DVT Evaluation: No calf swelling or tenderness Extremities: No peripheral edema  Recent Labs    08/14/17 0747  HGB 12.5  HCT 37.1    Assessment/Plan:  ASSESSMENT: Cheron Schaumannna Omlor is a 42 y.o. [id number] 3729w1d s/p SVD following elective IOL.  Discharge home and Contraception IUD   LOS: 2 days   Rolland BimlerLexi J Ripley Bogosian MS3 08/16/2017, 7:34 AM

## 2017-08-16 NOTE — Discharge Summary (Signed)
OB Discharge Summary     Patient Name: Teresa Orozco DOB: 05/31/1975 MRN: [id number]  Date of admission: 08/14/2017 Delivering MD: Shawna ClampBOOKER, KIMBERLY R   Date of discharge: 08/16/2017  Admitting diagnosis: INDUCTION Intrauterine pregnancy: 5864w1d     Secondary diagnosis:  Active Problems:   Advanced maternal age in multigravida 72>40   Grand multipara   Language barrier   SVD (spontaneous vaginal delivery)  Additional problems: none     Discharge diagnosis: Term Pregnancy Delivered                                                                                                Post partum procedures:none  Augmentation: Pitocin  Complications: None  Hospital course:  Induction of Labor With Vaginal Delivery   42 y.o. yo [id number] at 3964w1d was admitted to the hospital 08/14/2017 for induction of labor.  Indication for induction: AMA.  Patient had an uncomplicated labor course as follows: Membrane Rupture Time/Date: 6:34 PM ,08/14/2017   Intrapartum Procedures: Episiotomy: None [1]                                         Lacerations:  None [1]  Patient had delivery of a Viable infant.  Information for the patient's newborn:  Martyn Malayrmenta, Boy Ambert [[id number]][[id number]]  Delivery Method: Vag-Spont   08/14/2017  Details of delivery can be found in separate delivery note.  Patient had a routine postpartum course. Patient is discharged home 08/16/17.  Physical exam  Vitals:   08/15/17 1136 08/15/17 1402 08/15/17 2213 08/16/17 0641  BP: 105/67 99/76 110/64 116/72  Pulse: 89 82 73 73  Resp:    16  Temp: 97.9 F (36.6 C) 97.8 F (36.6 C)  98.1 F (36.7 C)  TempSrc: Oral Oral  Oral  SpO2:  99%  99%  Weight:      Height:       General: alert, cooperative and no distress Lochia: appropriate Uterine Fundus: firm DVT Evaluation: No evidence of DVT seen on physical exam. No significant calf/ankle edema. Labs: Lab Results  Component Value Date   WBC 11.3 (H) 08/14/2017   HGB 12.5 08/14/2017   HCT  37.1 08/14/2017   MCV 92.3 08/14/2017   PLT 285 08/14/2017   CMP Latest Ref Rng & Units 01/23/2017  Glucose 65 - 99 mg/dL 85  BUN 6 - 24 mg/dL 6  Creatinine 1.300.57 - 8.651.00 mg/dL 7.84(O0.55(L)  Sodium 962134 - 952144 mmol/L 138  Potassium 3.5 - 5.2 mmol/L 4.1  Chloride 96 - 106 mmol/L 103  CO2 20 - 29 mmol/L 20  Calcium 8.7 - 10.2 mg/dL 9.1  Total Protein 6.0 - 8.5 g/dL 7.3  Total Bilirubin 0.0 - 1.2 mg/dL 0.5  Alkaline Phos 39 - 117 IU/L 64  AST 0 - 40 IU/L 13  ALT 0 - 32 IU/L 12    Discharge instruction: per After Visit Summary and "Baby and Me Booklet".  After visit meds:  Allergies as of 08/16/2017   No Known Allergies  Medication List    STOP taking these medications   aspirin EC 81 MG tablet     TAKE these medications   Prenatal Vitamins 0.8 MG tablet Take 1 tablet by mouth daily.       Diet: routine diet  Activity: Advance as tolerated. Pelvic rest for 6 weeks.   Outpatient follow up:4 weeks Follow up Appt:No future appointments. Follow up Visit:No follow-ups on file.  Postpartum contraception: IUD Mirena  Newborn Data: Live born female  Birth Weight: 8 lb 8.3 oz (3864 g) APGAR: 9, 9  Newborn Delivery   Birth date/time:  08/14/2017 20:39:00 Delivery type:  Vaginal, Spontaneous     Baby Feeding: Bottle Disposition:home with mother   08/16/2017 Sharyon Cable, CNM

## 2017-08-17 ENCOUNTER — Telehealth: Payer: Self-pay | Admitting: General Practice

## 2017-08-17 NOTE — Telephone Encounter (Signed)
Appt reminder mailed to patient

## 2017-09-20 ENCOUNTER — Ambulatory Visit (INDEPENDENT_AMBULATORY_CARE_PROVIDER_SITE_OTHER): Payer: Self-pay | Admitting: Student

## 2017-09-20 VITALS — BP 127/76 | HR 65 | Wt 174.0 lb

## 2017-09-20 DIAGNOSIS — Z113 Encounter for screening for infections with a predominantly sexual mode of transmission: Secondary | ICD-10-CM

## 2017-09-20 DIAGNOSIS — N898 Other specified noninflammatory disorders of vagina: Secondary | ICD-10-CM

## 2017-09-20 DIAGNOSIS — Z1151 Encounter for screening for human papillomavirus (HPV): Secondary | ICD-10-CM

## 2017-09-20 DIAGNOSIS — Z3042 Encounter for surveillance of injectable contraceptive: Secondary | ICD-10-CM

## 2017-09-20 DIAGNOSIS — Z3202 Encounter for pregnancy test, result negative: Secondary | ICD-10-CM

## 2017-09-20 DIAGNOSIS — Z124 Encounter for screening for malignant neoplasm of cervix: Secondary | ICD-10-CM

## 2017-09-20 DIAGNOSIS — O09523 Supervision of elderly multigravida, third trimester: Secondary | ICD-10-CM

## 2017-09-20 LAB — POCT PREGNANCY, URINE: Preg Test, Ur: NEGATIVE

## 2017-09-20 MED ORDER — MEDROXYPROGESTERONE ACETATE 150 MG/ML IM SUSP: 150.0000 mg | INTRAMUSCULAR | 0 refills | Status: DC

## 2017-09-20 MED ORDER — FLUCONAZOLE 150 MG PO TABS: 150.0000 mg | ORAL_TABLET | Freq: Every day | ORAL | 1 refills | Status: DC

## 2017-09-20 MED ORDER — MEDROXYPROGESTERONE ACETATE 150 MG/ML IM SUSP
150.0000 mg | Freq: Once | INTRAMUSCULAR | Status: AC
  Administered 2017-09-20: 150 mg via INTRAMUSCULAR

## 2017-09-20 NOTE — Patient Instructions (Addendum)
Información sobre el dispositivo intrauterino  (Intrauterine Device Information)  Un dispositivo intrauterino (DIU) se inserta en el útero e impide el embarazo. Hay dos tipos de DIU:  · DIU de cobre: este tipo de DIU está recubierto con un alambre de cobre y se inserta dentro del útero. El cobre hace que el útero y las trompas de [place] produzcan un liquido que destruye los espermatozoides. El DIU de cobre puede permanecer en el lugar durante 10 años.  · DIU con hormona: este tipo de DIU contiene la hormona progestina (progesterona sintética). Las hormonas hacen que el moco cervical se haga más espeso, lo que evita que el esperma ingrese al útero. También hace que la membrana que recubre internamente al útero sea más delgada lo que impide el implante del óvulo fertilizado. La hormona debilita o destruye los espermatozoides que ingresan al útero. Alguno de los tipos de DIU hormonal pueden permanecer en el lugar durante 5 años y otros tipos pueden dejarse en el lugar por 3 años.  El médico se asegurará de que usted sea una buena candidata para usar el DIU. Converse con su médico acerca de los posibles efectos secundarios.  VENTAJASDEL DISPOSITIVO INTRAUTERINO  · El DIU es muy eficaz, reversible, de acción prolongada y de bajo mantenimiento.  · No hay efectos secundarios relacionados con el estrógeno.  · El DIU puede ser utilizado durante la lactancia.  · No está asociado con el aumento de peso.  · Funciona inmediatamente después de la inserción.  · El DIU hormonal funciona inmediatamente si se inserta dentro de los 7 días del inicio del período. Será necesario que utilice un método anticonceptivo adicional durante 7 días si el DIU hormonal se inserta en algún otro momento del ciclo.  · El DIU de cobre no interfiere con las hormonas femeninas.  · El DIU hormonal puede hacer que los períodos menstruales abundantes se hagan más ligeros y que haya menos cólicos.  · El DIU hormonal puede usarse durante 3 a 5 años.   · El DIU de cobre puede usarse durante 10 años.    DESVENTAJASDEL DISPOSITIVO INTRAUTERINO  · El DIU hormonal puede estar asociado con patrones de sangrado irregular.  · El DIU de cobre puede hacer que el flujo menstrual más abundante y doloroso.  · Puede experimentar cólicos y sangrado vaginal después de la inserción.    Esta información no tiene como fin reemplazar el consejo del médico. Asegúrese de hacerle al médico cualquier pregunta que tenga.  Document Released: 06/22/2009 Document Revised: 04/26/2015 Document Reviewed: 06/23/2012  Elsevier Interactive Patient Education © 2017 Elsevier Inc.

## 2017-09-20 NOTE — Progress Notes (Addendum)
Subjective:     Teresa Orozco is a 42 y.o. female who presents for a postpartum visit. She is 5 weeks postpartum following a spontaneous vaginal delivery. I have fully reviewed the prenatal and intrapartum course. The delivery was at 39.1 gestational weeks. Outcome: spontaneous vaginal delivery. Anesthesia: epidural. Postpartum course has been unremarkable. Baby's course has been unremarkable. Baby is feeding by bottle - Similac Advance. Bleeding no bleeding. Bowel function is normal. Bladder function is normal. Patient is not sexually active. Contraception method is undecided . Postpartum depression screening: negative.  The following portions of the patient's history were reviewed and updated as appropriate: allergies, current medications, past family history, past medical history, past social history, past surgical history and problem list.  Review of Systems complaing of vaginal itching for four days. Denies abnormal discharge.   Patient strongly desires Mirena, does not want to go to [facility] because she feels more comfortable here.   Objective:    BP 127/76   Pulse 65   Wt 174 lb (78.9 kg)   BMI 28.96 kg/m   General:  alert, cooperative and no distress   Breasts:  inspection negative, no nipple discharge or bleeding, no masses or nodularity palpable  Lungs: clear to auscultation bilaterally  Heart:  regular rate and rhythm, S1, S2 normal, no murmur, click, rub or gallop  Abdomen: soft, non-tender; bowel sounds normal; no masses,  no organomegaly. Normal involution.    Vulva:  positive for redness consistent with patient's scratching. no lesions, lacerations, swelling or blistering.   Vagina: trace lochia; cervix with no lesions, vaginal walls with discharge consistent with yeast.   Cervix:  no cervical motion tenderness  Corpus: normal  Adnexa:  no mass, fullness, tenderness  Rectal Exam: Not performed.        Assessment:    Normal postpartum exam. Pap smear done at today's visit.    Plan:    1. Contraception: Received Depo shot today; explained details to patient about how to apply for financial aid for Mirena. Patient will bring in 4 paychecks and fill out form with RN or CMA assistance in the future.  2. UPT negative. Sent RX for Diflucan, Wet prep and pap pending.   3. Follow up in: PRN  or as needed.    Luna Kitchens

## 2017-09-21 LAB — CERVICOVAGINAL ANCILLARY ONLY
Bacterial vaginitis: NEGATIVE
CANDIDA VAGINITIS: POSITIVE — AB
TRICH (WINDOWPATH): NEGATIVE

## 2017-09-25 LAB — CYTOLOGY - PAP
Diagnosis: UNDETERMINED — AB
HPV: DETECTED — AB

## 2017-10-02 ENCOUNTER — Encounter: Payer: Self-pay | Admitting: Student

## 2017-10-02 DIAGNOSIS — R87610 Atypical squamous cells of undetermined significance on cytologic smear of cervix (ASC-US): Secondary | ICD-10-CM | POA: Insufficient documentation

## 2017-10-02 DIAGNOSIS — R87810 Cervical high risk human papillomavirus (HPV) DNA test positive: Secondary | ICD-10-CM

## 2017-10-03 ENCOUNTER — Telehealth: Payer: Self-pay | Admitting: *Deleted

## 2017-10-03 NOTE — Telephone Encounter (Signed)
Per message from Luna KitchensKathryn Kooistra, CNM need to call patient and tell her abnormal papsmear and needs colposcopy. Also is self pay and needs to be offered to apply for financial assistance. I called patient with Interpreter Nile RiggsMariel Gallego and informed her of abnormal pap and need for colposcopy and that she would be called with an appointment by our registrars. Also informed her she may apply for financial assistance by picking up application at our office during office hours. She voices understanding.

## 2017-10-24 ENCOUNTER — Telehealth (HOSPITAL_COMMUNITY): Payer: Self-pay | Admitting: *Deleted

## 2017-10-24 NOTE — Telephone Encounter (Signed)
Interpreter Teresa Orozco has attempted to reach patient several times. She has left several messages. Telephoned patient's spouse and he will inform patient to contact us at [facility].

## 2017-12-03 ENCOUNTER — Encounter: Payer: Self-pay | Admitting: Family Medicine

## 2018-07-26 ENCOUNTER — Telehealth: Payer: Self-pay | Admitting: *Deleted

## 2018-07-26 NOTE — Telephone Encounter (Signed)
Received a phone call from Teresa Orozco, Interpreter who is at [employer]. They are seeing Teresa Orozco who was supposed to get a colposcopy in the Fall but she was lost to care. She has upcoming [facility] appt and would like Colpo scheduled after that. Informed her I will send message to registrars to schedule and call patient and notify [facility].

## 2018-07-29 ENCOUNTER — Other Ambulatory Visit (HOSPITAL_COMMUNITY): Payer: Self-pay | Admitting: *Deleted

## 2018-07-29 DIAGNOSIS — Z1231 Encounter for screening mammogram for malignant neoplasm of breast: Secondary | ICD-10-CM

## 2018-08-27 ENCOUNTER — Encounter (HOSPITAL_COMMUNITY): Payer: Self-pay

## 2018-08-27 ENCOUNTER — Ambulatory Visit
Admission: RE | Admit: 2018-08-27 | Discharge: 2018-08-27 | Disposition: A | Discharge status: Home / Self Care | Payer: No Typology Code available for payment source | Source: Ambulatory Visit | Attending: Obstetrics and Gynecology | Admitting: Obstetrics and Gynecology

## 2018-08-27 ENCOUNTER — Other Ambulatory Visit: Payer: Self-pay

## 2018-08-27 ENCOUNTER — Ambulatory Visit (HOSPITAL_COMMUNITY)
Admission: RE | Admit: 2018-08-27 | Discharge: 2018-08-27 | Disposition: A | Discharge status: Home / Self Care | Payer: PRIVATE HEALTH INSURANCE | Source: Ambulatory Visit | Attending: Obstetrics and Gynecology | Admitting: Obstetrics and Gynecology

## 2018-08-27 DIAGNOSIS — Z01419 Encounter for gynecological examination (general) (routine) without abnormal findings: Secondary | ICD-10-CM | POA: Insufficient documentation

## 2018-08-27 DIAGNOSIS — Z1231 Encounter for screening mammogram for malignant neoplasm of breast: Secondary | ICD-10-CM

## 2018-08-27 LAB — RESULTS CONSOLE HPV: CHL HPV: NEGATIVE

## 2018-08-27 NOTE — Progress Notes (Signed)
No complaints today.   Pap Smear: Pap smear completed today. Last Pap smear was 09/20/2017 [facility] and ASCUS with positive HPV. Patient did not have any follow-up completed for last Pap smear. Per patient her last Pap smear is the only abnormal Pap smear she has had. Last Pap smear result is in Epic.  Physical exam: Breasts Breasts symmetrical. No skin abnormalities right breast. Scab from pimple per patient that has almost healed observed on the left breast at 4 o'clock. No nipple retraction bilateral breasts. No nipple discharge bilateral breasts. No lymphadenopathy. No lumps palpated bilateral breasts. No complaints of pain or tenderness on exam. Referred patient to the [place] [facility] for a screening mammogram. Appointment scheduled for Tuesday, August 27, 2018 at [place].        Pelvic/Bimanual   Ext Genitalia No lesions, no swelling and no discharge observed on external genitalia.         Vagina Vagina pink and normal texture. No lesions or discharge observed in vagina.          Cervix Cervix is present. Cervix pink and of normal texture. No discharge observed.     Uterus Uterus is present and palpable. Uterus in normal position and normal size.        Adnexae Bilateral ovaries present and palpable. No tenderness on palpation.         Rectovaginal No rectal exam completed today since patient had no rectal complaints. No skin abnormalities observed on exam.    Smoking History: Patient has never smoked.  Patient Navigation: Patient education provided. Access to services provided for patient through [facility] program. Spanish interpreter provided.   Breast and Cervical Cancer Risk Assessment: Patient has no family history of breast cancer, known genetic mutations, or radiation treatment to the chest before age 82. Patient has no history of cervical dysplasia, immunocompromised, or DES exposure in-utero.  Risk Assessment    Risk Scores    08/27/2018   Last edited by: Armond Hang, LPN   5-year risk: 0.4 %   Lifetime risk: 5.7 %         Used Spanish interpreter Retta Mac from [facility].

## 2018-08-27 NOTE — Patient Instructions (Signed)
Explained breast self awareness with Emeterio Reeve. Let patient know that follow-up for today's Pap smear will be based on the result. Referred patient to the [facility] for a screening mammogram. Appointment scheduled for Tuesday, August 27, 2018 at [place]. Patient aware of appointment and will be there. Let patient know will follow up with her within the next couple weeks with results of Pap smear by letter or phone. Informed patient that the [facility] will follow-up with her within the next couple of weeks with results of mammogram by letter or phone. Emberleigh Bakke verbalized understanding.  Odas Ozer, Arvil Chaco, RN 9:11 AM

## 2018-08-29 LAB — CYTOLOGY - PAP
Diagnosis: UNDETERMINED — AB
HPV: NOT DETECTED

## 2018-09-02 ENCOUNTER — Encounter (HOSPITAL_COMMUNITY): Payer: Self-pay | Admitting: *Deleted

## 2018-09-02 ENCOUNTER — Telehealth (HOSPITAL_COMMUNITY): Payer: Self-pay | Admitting: *Deleted

## 2018-09-02 NOTE — Telephone Encounter (Signed)
-----   Message from Loletta Parish, RN sent at 08/30/2018  1:48 PM EDT ----- Will you call this result to the patient on Monday with Doroteo Bradford?  Thanks, [employer]

## 2018-09-02 NOTE — Telephone Encounter (Signed)
Telephoned patient at home number and advised patient of abnormal pap smear results. HPV was negative. Advised patient would need pap smear in one year. Used interpreter Rudene Anda. Patient voiced understanding.

## 2018-09-04 ENCOUNTER — Other Ambulatory Visit: Payer: Self-pay

## 2018-09-04 ENCOUNTER — Inpatient Hospital Stay: Payer: Self-pay | Attending: Obstetrics and Gynecology | Admitting: *Deleted

## 2018-09-04 VITALS — BP 114/86 | Temp 97.1°F | Ht 66.0 in | Wt 177.0 lb

## 2018-09-04 DIAGNOSIS — Z0000 Encounter for general adult medical examination without abnormal findings: Secondary | ICD-10-CM

## 2018-09-04 NOTE — Progress Notes (Signed)
[facility] initial screening   interpreter- Rudene Anda, [employer]   Clinical Measurement:  Height: 66 in Weight: 177 lb  Blood Pressure: 124/86  Blood Pressure #2: 114/86 Fasting Labs Drawn Today, will review with patient when they result.   Medical History:  Patient states that she does not have a history of high cholesterol, high blood pressure or diabetes.  Medications:  Patient states that she does not take medication to lower cholesterol, blood pressure or blood sugar. Patient does not take an aspirin a day to help prevent a heart attack or stroke.    Blood pressure, self measurement: Patient states that she does measure blood pressure from home and has not been told to do so by a health care provider.   Nutrition: Patient states that on average she eats 0 cups of fruit and 0 cups of vegetables per day. Patient states that she does not eat fish at least 2 times per week. Patient eats less than half servings of whole grains. Patient does not drink less than 36 ounces of beverages with added sugar weekly. Patient is not currently watching sodium or salt intake. In the past 7 days patient has not had any drinks containing alcohol. On average patient does not drink any drinks containing alcohol.      Physical activity:  Patient states that she gets 0 minutes of moderate and 0 minutes of vigorous physical activity each week.  Smoking status:  Patient states that she has never smoked tobacco.   Quality of life:  Over the past 2 weeks patient states that she has not had any days where she has little interest or pleasure in doing things and 0 days where she has felt down, depressed or hopeless.    Risk reduction and counseling:  [place] with patient in detail about diet and exercise. Explained the importance of adding fruits and vegetables in diet. Also spoke with patient about reducing the amount of sodas that she drinks. Patient stated that she currently drinks 3 sodas a day for a  total of 21 sodas per week. Educated patient that she is consuming a large amount of sugar in her soda consumption. Patient also stated that she cooks with a lot of salt and adds extra salt to her food before she eats. Explained to patient that she should start watching the amount of salt that she cooks with and to not add extra salt to food before she eats. We also talked about adding exercise in daily routine. Encouraged patient to try and get 20-30 minutes a day of at least walking.   Navigation:  I will notify patient of lab results. Patient is aware of 2 more health coaching sessions and a follow up.  Time: 35 minutes

## 2018-09-05 LAB — LIPID PANEL W/O CHOL/HDL RATIO
Cholesterol, Total: 192 mg/dL (ref 100–199)
HDL: 46 mg/dL (ref >39)
LDL Calculated: 107 mg/dL — ABNORMAL HIGH (ref 0–99)
Triglycerides: 193 mg/dL — ABNORMAL HIGH (ref 0–149)
VLDL Cholesterol Cal: 39 mg/dL (ref 5–40)

## 2018-09-05 LAB — HGB A1C W/O EAG: Hgb A1c MFr Bld: 5.6 % (ref 4.8–5.6)

## 2018-09-05 LAB — GLUCOSE, RANDOM: Glucose: 95 mg/dL (ref 65–99)

## 2018-09-11 ENCOUNTER — Ambulatory Visit: Payer: Self-pay | Admitting: Obstetrics and Gynecology

## 2018-09-11 ENCOUNTER — Telehealth: Payer: Self-pay

## 2018-09-11 NOTE — Telephone Encounter (Signed)
Tried calling patient to give lab results for [facility] program but voicemail box was full. Could not leave message, will try back.

## 2018-09-12 ENCOUNTER — Telehealth: Payer: Self-pay

## 2018-09-12 NOTE — Telephone Encounter (Signed)
Tried to call patient to give Teresa Orozco lab results. Voicemail box full. 3rd attempt to call patient. Will try again next week.

## 2018-09-16 ENCOUNTER — Telehealth: Payer: Self-pay

## 2018-09-16 NOTE — Telephone Encounter (Signed)
Tried to call patient to go over [facility] lab results. Voice mail box was full and was not able to leave message. Will try again.

## 2018-09-17 ENCOUNTER — Telehealth: Payer: Self-pay

## 2018-09-17 NOTE — Telephone Encounter (Signed)
Health coaching 2   interpreter- Rudene Anda, [place] cholesterol , 107 LDL cholesterol , 1933 triglycerides , 46 HDL cholesterol , 5.6 hemoglobin A1C, 95 mean plasma glucose   Patient understands and is aware of her lab results.  Goals-  Spoke with patient about lab results. Answered any questions that patient had regarding results. Spoke with patient about reducing the amount of red meat and fried fatty foods that the patient consumes. Also spoke about adding heart healthy fats into diet. Encouraged patient to try olive oil, avocados and heart healthy nuts.   Goals- Add heart healthy fats into diet as well as reduce the amount of fried foods consumed. Add fruits and vegetables into diet, with a goal of 2 fruits and 3 vegetables per day. Start exercising for 20-30 minutes a day.    Navigation:  Patient is aware of 1 more health coaching sessions and a follow up.   Time- 11 minutes

## 2018-11-13 ENCOUNTER — Telehealth (HOSPITAL_COMMUNITY): Payer: Self-pay

## 2018-11-13 NOTE — Telephone Encounter (Signed)
Tried to call patient to complete [facility] 3 for [facility] program but patients VM box was full. Will try again.

## 2019-02-19 ENCOUNTER — Telehealth: Payer: Self-pay

## 2019-02-19 NOTE — Telephone Encounter (Signed)
Left message for patient about completing HC 3 for the [facility] program. Left name and number for patient to call back. 

## 2019-03-31 ENCOUNTER — Telehealth: Payer: Self-pay

## 2019-03-31 NOTE — Telephone Encounter (Signed)
Left message for patient about conducting her health coaching 3 session for the [facility] program. Left name and number for her to call back.

## 2019-09-08 ENCOUNTER — Encounter (HOSPITAL_COMMUNITY): Payer: Self-pay | Admitting: *Deleted

## 2019-09-08 ENCOUNTER — Other Ambulatory Visit: Payer: Self-pay

## 2019-09-08 ENCOUNTER — Inpatient Hospital Stay (HOSPITAL_COMMUNITY)
Admission: EM | Admit: 2019-09-08 | Discharge: 2019-09-13 | DRG: 871 | Disposition: A | Discharge status: Home / Self Care | Payer: Self-pay | Attending: Family Medicine | Admitting: Family Medicine

## 2019-09-08 ENCOUNTER — Emergency Department (HOSPITAL_COMMUNITY): Payer: Self-pay

## 2019-09-08 DIAGNOSIS — N179 Acute kidney failure, unspecified: Secondary | ICD-10-CM | POA: Diagnosis present

## 2019-09-08 DIAGNOSIS — E872 Acidosis: Secondary | ICD-10-CM | POA: Diagnosis present

## 2019-09-08 DIAGNOSIS — J9601 Acute respiratory failure with hypoxia: Secondary | ICD-10-CM | POA: Diagnosis present

## 2019-09-08 DIAGNOSIS — J1282 Pneumonia due to coronavirus disease 2019: Secondary | ICD-10-CM | POA: Diagnosis present

## 2019-09-08 DIAGNOSIS — R509 Fever, unspecified: Secondary | ICD-10-CM

## 2019-09-08 DIAGNOSIS — R6520 Severe sepsis without septic shock: Secondary | ICD-10-CM | POA: Diagnosis present

## 2019-09-08 DIAGNOSIS — R110 Nausea: Secondary | ICD-10-CM | POA: Diagnosis present

## 2019-09-08 DIAGNOSIS — E049 Nontoxic goiter, unspecified: Secondary | ICD-10-CM | POA: Diagnosis present

## 2019-09-08 DIAGNOSIS — E876 Hypokalemia: Secondary | ICD-10-CM | POA: Diagnosis present

## 2019-09-08 DIAGNOSIS — N390 Urinary tract infection, site not specified: Secondary | ICD-10-CM | POA: Diagnosis present

## 2019-09-08 DIAGNOSIS — U071 COVID-19: Secondary | ICD-10-CM | POA: Diagnosis present

## 2019-09-08 DIAGNOSIS — R7401 Elevation of levels of liver transaminase levels: Secondary | ICD-10-CM | POA: Diagnosis not present

## 2019-09-08 DIAGNOSIS — A4189 Other specified sepsis: Principal | ICD-10-CM | POA: Diagnosis present

## 2019-09-08 DIAGNOSIS — R778 Other specified abnormalities of plasma proteins: Secondary | ICD-10-CM

## 2019-09-08 LAB — COMPREHENSIVE METABOLIC PANEL
ALT: 95 U/L — ABNORMAL HIGH (ref 0–44)
AST: 100 U/L — ABNORMAL HIGH (ref 15–41)
Albumin: 3.3 g/dL — ABNORMAL LOW (ref 3.5–5.0)
Alkaline Phosphatase: 109 U/L (ref 38–126)
Anion gap: 13 (ref 5–15)
BUN: 20 mg/dL (ref 6–20)
CO2: 21 mmol/L — ABNORMAL LOW (ref 22–32)
Calcium: 8.3 mg/dL — ABNORMAL LOW (ref 8.9–10.3)
Chloride: 98 mmol/L (ref 98–111)
Creatinine, Ser: 1.07 mg/dL — ABNORMAL HIGH (ref 0.44–1.00)
GFR calc Af Amer: >60 mL/min (ref >60)
GFR calc non Af Amer: >60 mL/min (ref >60)
Glucose, Bld: 117 mg/dL — ABNORMAL HIGH (ref 70–99)
Potassium: 3.2 mmol/L — ABNORMAL LOW (ref 3.5–5.1)
Sodium: 132 mmol/L — ABNORMAL LOW (ref 135–145)
Total Bilirubin: 0.7 mg/dL (ref 0.3–1.2)
Total Protein: 7.3 g/dL (ref 6.5–8.1)

## 2019-09-08 LAB — CBC WITH DIFFERENTIAL/PLATELET
Abs Immature Granulocytes: 0.03 10*3/uL (ref 0.00–0.07)
Basophils Absolute: 0 10*3/uL (ref 0.0–0.1)
Basophils Relative: 0 %
Eosinophils Absolute: 0 10*3/uL (ref 0.0–0.5)
Eosinophils Relative: 0 %
HCT: 44 % (ref 36.0–46.0)
Hemoglobin: 14.5 g/dL (ref 12.0–15.0)
Immature Granulocytes: 1 %
Lymphocytes Relative: 13 %
Lymphs Abs: 0.8 10*3/uL (ref 0.7–4.0)
MCH: 29.7 pg (ref 26.0–34.0)
MCHC: 33 g/dL (ref 30.0–36.0)
MCV: 90 fL (ref 80.0–100.0)
Monocytes Absolute: 0.2 10*3/uL (ref 0.1–1.0)
Monocytes Relative: 2 %
Neutro Abs: 5.6 10*3/uL (ref 1.7–7.7)
Neutrophils Relative %: 84 %
Platelets: 203 10*3/uL (ref 150–400)
RBC: 4.89 MIL/uL (ref 3.87–5.11)
RDW: 14 % (ref 11.5–15.5)
WBC: 6.6 10*3/uL (ref 4.0–10.5)
nRBC: 0 % (ref 0.0–0.2)

## 2019-09-08 LAB — TROPONIN I (HIGH SENSITIVITY): Troponin I (High Sensitivity): 38 ng/L — ABNORMAL HIGH (ref <18)

## 2019-09-08 LAB — LACTIC ACID, PLASMA: Lactic Acid, Venous: 1.5 mmol/L (ref 0.5–1.9)

## 2019-09-08 LAB — D-DIMER, QUANTITATIVE: D-Dimer, Quant: 1.8 ug/mL-FEU — ABNORMAL HIGH (ref 0.00–0.50)

## 2019-09-08 LAB — I-STAT BETA HCG BLOOD, ED (MC, WL, AP ONLY): I-stat hCG, quantitative: <5 m[IU]/mL (ref <5)

## 2019-09-08 IMAGING — MG DIGITAL SCREENING BILATERAL MAMMOGRAM WITH TOMO AND CAD
6 of 10 series · 6 of 30 positions shown · non-contrast
Comparison: None.

CLINICAL DATA: Screening. This is the patient's initial baseline
mammogram.

EXAM:
DIGITAL SCREENING BILATERAL MAMMOGRAM WITH TOMO AND CAD

[L CC synth-2D]
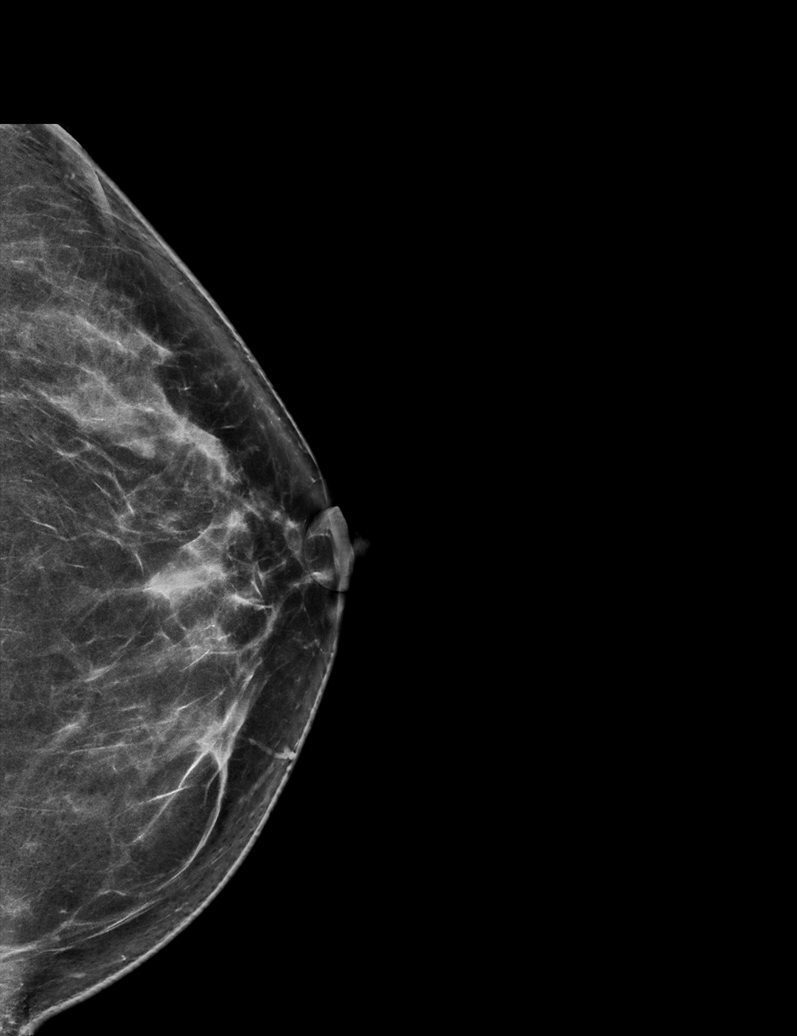

[R CC synth-2D (1 of 2)]
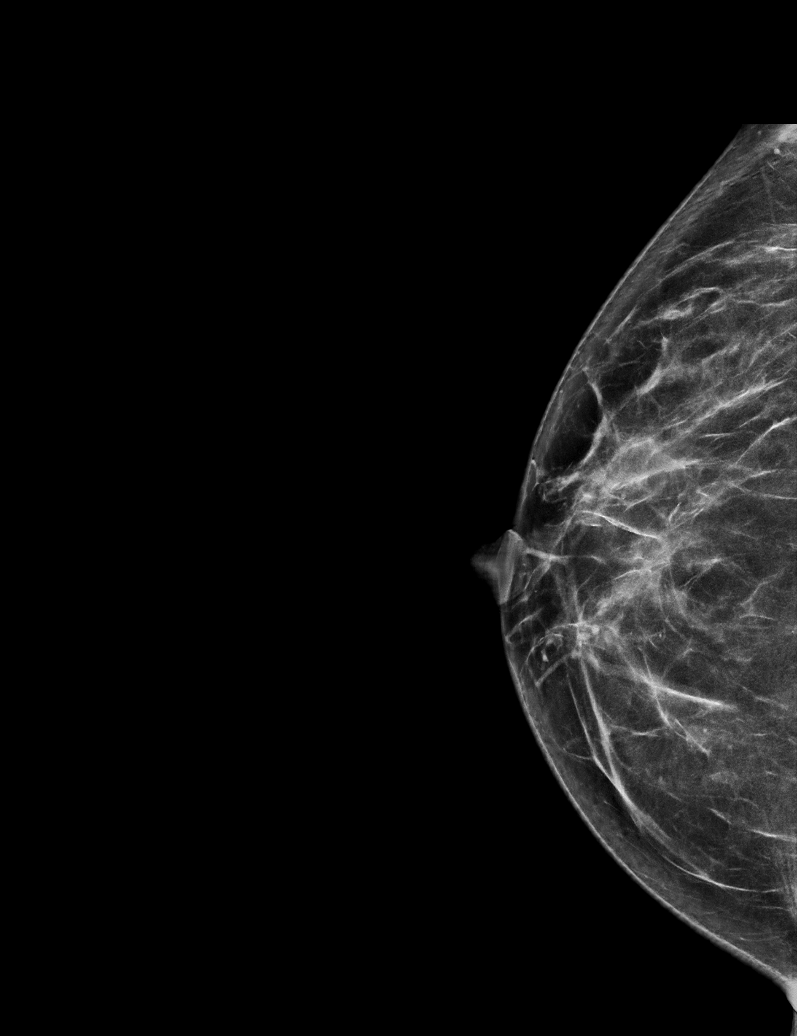

[R CC synth-2D (2 of 2)]
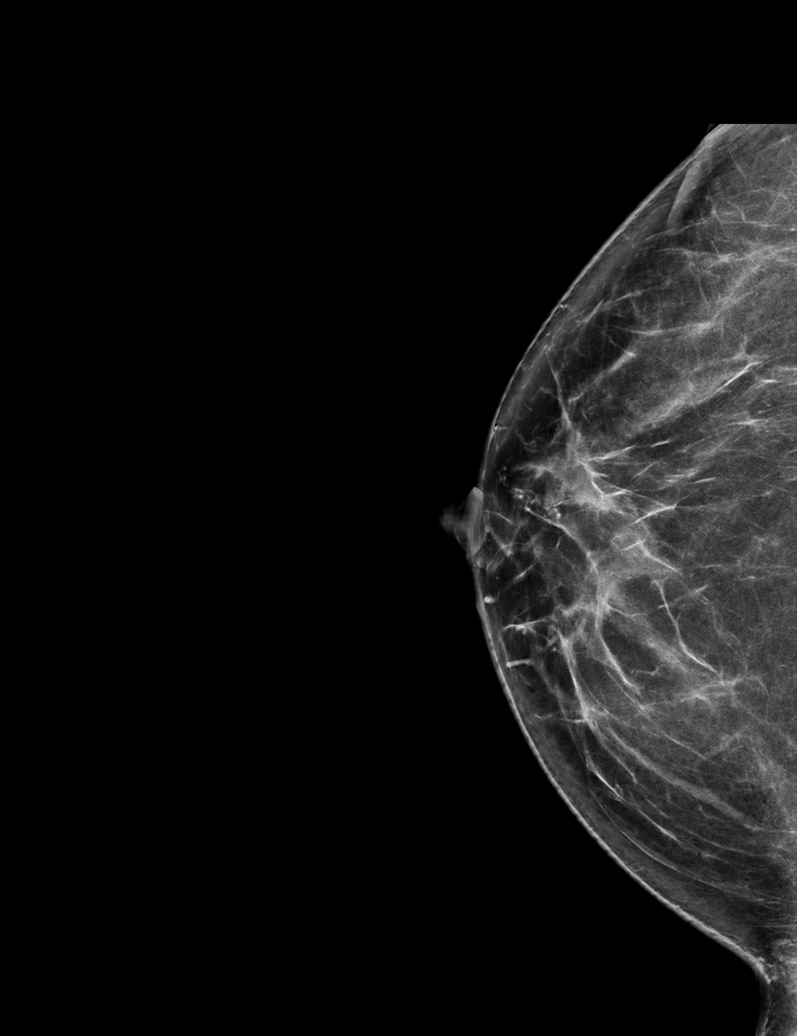

[R MLO synth-2D]
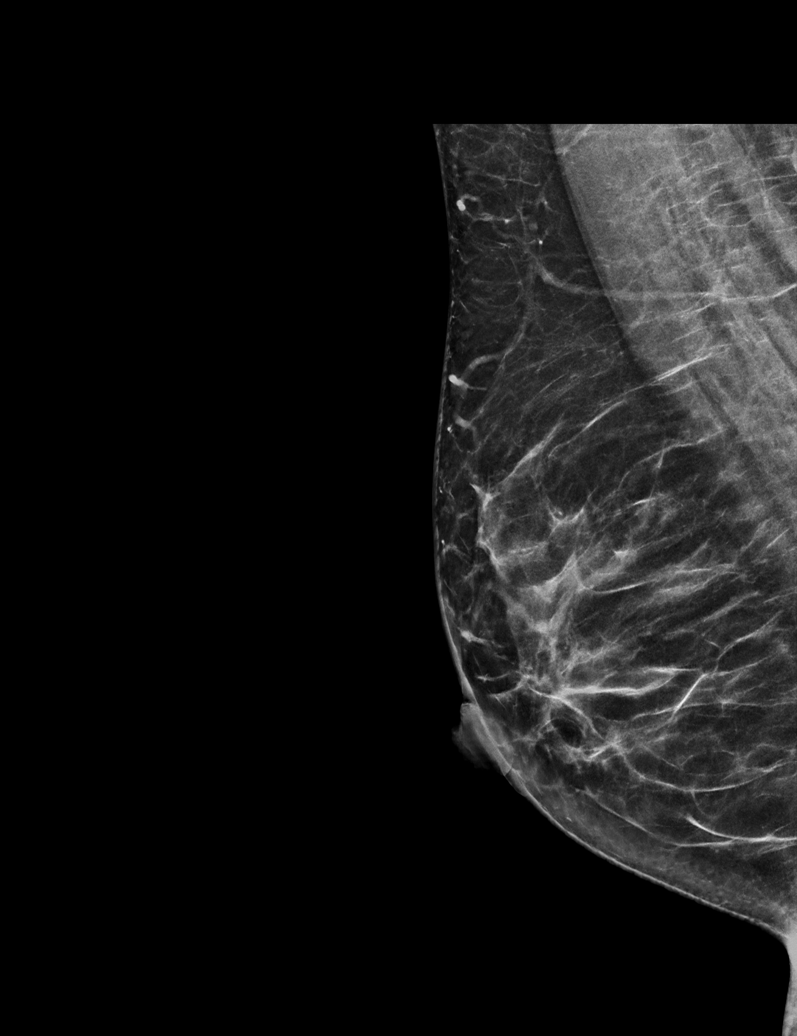

[L MLO synth-2D]
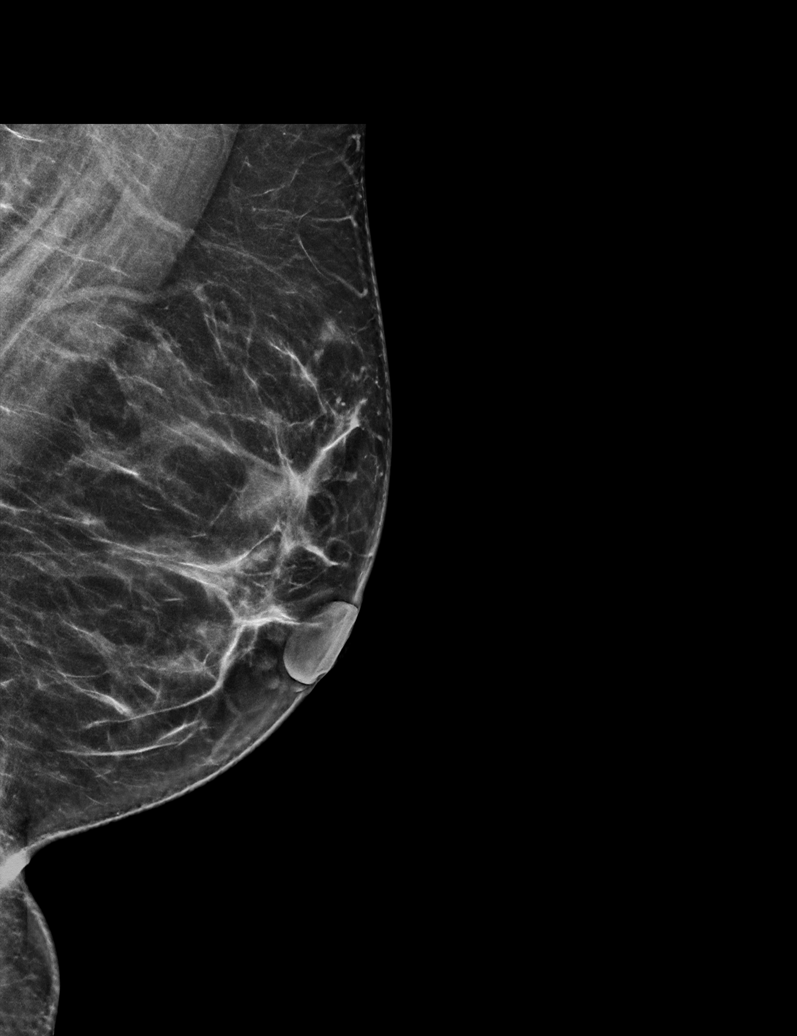

[R CC tomo · tomo slice 36/71.0]
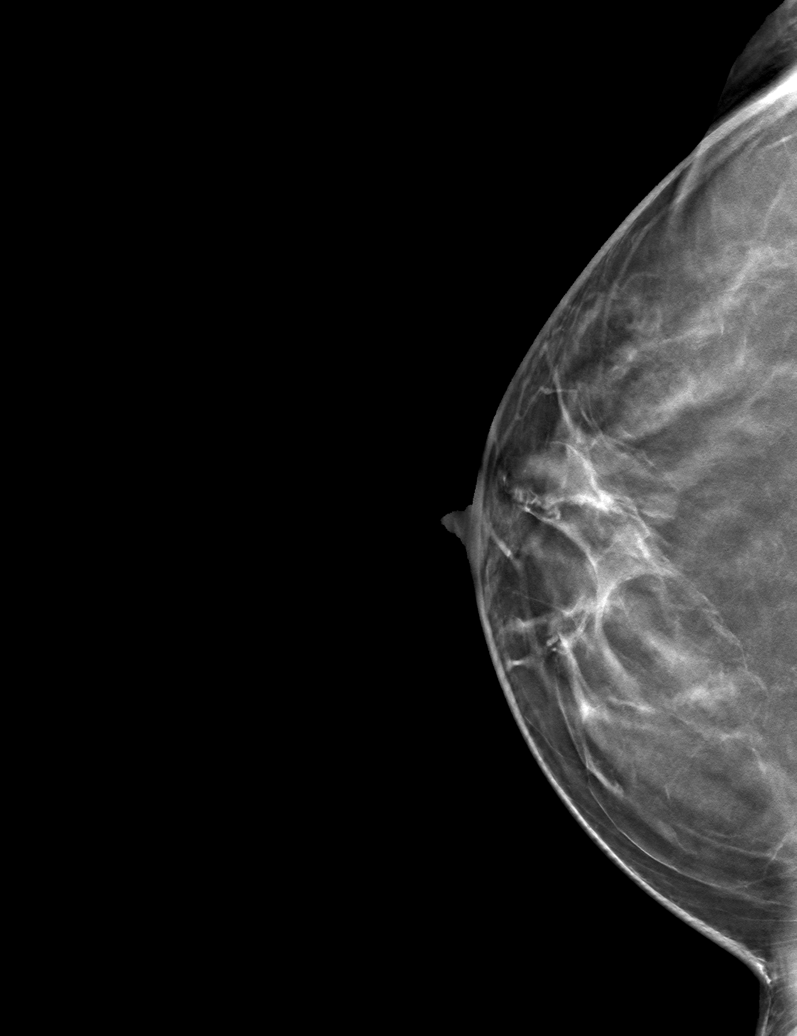

[6 of 30 positions shown; findings below may reference images not displayed]

ACR Breast Density Category c: The breast tissue is heterogeneously
dense, which may obscure small masses
FINDINGS: There are no findings suspicious for malignancy. Images were
processed with CAD.
IMPRESSION: No mammographic evidence of malignancy. A result letter of this
screening mammogram will be mailed directly to the patient.

RECOMMENDATION:
Screening mammogram in one year. (Code:XQ-N-FMR)

BI-RADS CATEGORY  1: Negative.

## 2019-09-08 MED ORDER — SODIUM CHLORIDE 0.9 % IV SOLN
2.0000 g | Freq: Once | INTRAVENOUS | Status: AC
  Administered 2019-09-08: 2 g via INTRAVENOUS
  Filled 2019-09-08: qty 20

## 2019-09-08 MED ORDER — LACTATED RINGERS IV BOLUS
2000.0000 mL | Freq: Once | INTRAVENOUS | Status: AC
  Administered 2019-09-08: 2000 mL via INTRAVENOUS

## 2019-09-08 MED ORDER — VANCOMYCIN HCL 1750 MG/350ML IV SOLN
1750.0000 mg | Freq: Once | INTRAVENOUS | Status: AC
  Administered 2019-09-09: 1750 mg via INTRAVENOUS
  Filled 2019-09-08: qty 350

## 2019-09-08 MED ORDER — ONDANSETRON HCL 4 MG/2ML IJ SOLN
4.0000 mg | Freq: Once | INTRAMUSCULAR | Status: AC
  Administered 2019-09-08: 4 mg via INTRAVENOUS
  Filled 2019-09-08: qty 2

## 2019-09-08 MED ORDER — SODIUM CHLORIDE 0.9 % IV SOLN: 2.0000 g | Freq: Once | INTRAVENOUS | Status: DC

## 2019-09-08 MED ORDER — SODIUM CHLORIDE 0.9 % IV SOLN: 1.0000 g | Freq: Once | INTRAVENOUS | Status: DC

## 2019-09-08 NOTE — ED Provider Notes (Signed)
[facility] EMERGENCY DEPARTMENT Provider Note   CSN: [id number] Arrival date & time: 09/08/19  2017     History No chief complaint on file.   Teresa Orozco is a 44 y.o. female with no significant past medical history presents the ED today for 2 weeks of fevers, weakness, malaise.  Patient states that she has had a near continuous tactile fever the last 2 weeks with some nausea for which she is had difficulty tolerating p.o.  She endorses occasional cough and yellowing of her urine as well.  Denies sick contacts, denies history of the same.  Denies known exposure to COVID-19.  The history is provided by the patient.  Illness Quality:  Fever, malaise Severity:  Severe Onset quality:  Gradual Duration:  2 weeks Timing:  Constant Progression:  Unchanged Chronicity:  New Context:  No known sick contacts. Associated symptoms: cough, fatigue, fever, headaches and nausea   Associated symptoms: no abdominal pain, no chest pain, no rash, no shortness of breath and no vomiting        Past Medical History:  Diagnosis Date  . Medical history non-contributory     Patient Active Problem List   Diagnosis Date Noted  . Well woman exam with routine gynecological exam 08/27/2018  . ASCUS with positive high risk HPV cervical 10/02/2017  . Grand multipara 04/23/2017  . Language barrier 04/23/2017  . Advanced maternal age in multigravida >40 07/26/2015    Past Surgical History:  Procedure Laterality Date  . DILATION AND CURETTAGE OF UTERUS  2008     OB History    Gravida  9   Para  6   Term  6   Preterm      AB  3   Living  6     SAB  3   TAB      Ectopic      Multiple  0   Live Births  6           No family history on file.  Social History   Tobacco Use  . Smoking status: Never Smoker  . Smokeless tobacco: Never Used  Vaping Use  . Vaping Use: Never used  Substance Use Topics  . Alcohol use: No  . Drug use: No    Home  Medications Prior to Admission medications   Medication Sig Start Date End Date Taking? Authorizing Provider  fluconazole (DIFLUCAN) 150 MG tablet Take 1 tablet (150 mg total) by mouth daily. Patient not taking: Reported on 08/27/2018 09/20/17   Marylene Land, CNM  ibuprofen (ADVIL) 200 MG tablet Take 200 mg by mouth every 6 (six) hours as needed.    [provider]  medroxyPROGESTERone (DEPO-PROVERA) 150 MG/ML injection Inject 1 mL (150 mg total) into the muscle every 3 (three) months. Patient not taking: Reported on 08/27/2018 09/20/17   Marylene Land, CNM  Prenatal Multivit-Min-Fe-FA (PRENATAL VITAMINS) 0.8 MG tablet Take 1 tablet by mouth daily. Patient not taking: Reported on 08/27/2018 01/23/17   Hermina Staggers, MD    Allergies    Patient has no known allergies.  Review of Systems   Review of Systems  Constitutional: Positive for chills, fatigue and fever.  HENT: Negative for facial swelling and voice change.   Eyes: Negative for redness and visual disturbance.  Respiratory: Positive for cough. Negative for shortness of breath.   Cardiovascular: Negative for chest pain and palpitations.  Gastrointestinal: Positive for nausea. Negative for abdominal pain and vomiting.  Genitourinary:  Negative for difficulty urinating and dysuria.  Musculoskeletal: Negative for gait problem and joint swelling.  Skin: Negative for rash and wound.  Neurological: Positive for weakness and headaches. Negative for dizziness.  Psychiatric/Behavioral: Negative for confusion and suicidal ideas.    Physical Exam Updated Vital Signs BP (!) 83/60 (BP Location: Left Arm)   Pulse (!) 106   Temp 99 F (37.2 C)   Resp 19   SpO2 95%   Physical Exam Constitutional:      General: She is in acute distress.     Appearance: She is obese. She is ill-appearing.  HENT:     Head: Normocephalic and atraumatic.     Mouth/Throat:     Mouth: Mucous membranes are moist.     Pharynx:  Oropharynx is clear.  Eyes:     General: No scleral icterus.    Pupils: Pupils are equal, round, and reactive to light.  Cardiovascular:     Rate and Rhythm: Regular rhythm. Tachycardia present.     Pulses: Normal pulses.  Pulmonary:     Effort: Pulmonary effort is normal. No respiratory distress.  Abdominal:     General: There is no distension.     Tenderness: There is no abdominal tenderness.  Musculoskeletal:        General: No tenderness or deformity.     Cervical back: Normal range of motion and neck supple.  Neurological:     General: No focal deficit present.     Mental Status: She is alert and oriented to person, place, and time.     Motor: No weakness.     Gait: Gait normal.  Psychiatric:        Mood and Affect: Mood normal.        Behavior: Behavior normal.     ED Results / Procedures / Treatments   Labs (all labs ordered are listed, but only abnormal results are displayed) Labs Reviewed - No data to display  EKG None  Radiology No results found.  Procedures Procedures (including critical care time)  Medications Ordered in ED Medications - No data to display  ED Course  I have reviewed the triage vital signs and the nursing notes.  Pertinent labs & imaging results that were available during my care of the patient were reviewed by me and considered in my medical decision making (see chart for details).  Clinical Course as of Sep 09 34  Tue Sep 09, 2019  0005 Troponin I (High Sensitivity)(!): 38 [JR]  0006 D-Dimer, Quant(!): 1.80 [JR]  0006 Temp: 99 F (37.2 C) [JR]  0006 Pulse Rate: 78 [JR]  0006 BP(!): 88/59 [JR]  0006 Resp: 19 [JR]  0006 SpO2: 98 % [JR]  0028 SARS Coronavirus 2(!): POSITIVE [JR]    Clinical Course User Index [JR] Loree Fee, MD   MDM Rules/Calculators/A&P                          Patient presents from triage complaining of weakness and fever however notably tachycardic and hypotensive highly concerning for sepsis  completely considering the patient's report of fever.  Sepsis protocol initiated on first contact.  Source of sepsis however remains certainly unclear, patient describes a viral-like prodrome of occasional cough, nausea, weakness and fever.  Does not endorse any significant change in her urine that she knows of other than the change in the color.  Has no abdominal pain on exam and lung fields are clear.  No appreciable wounds.  Entire sepsis protocol initiated including 30 cc/kg ideal body weight fluids, broad cultures.  In the absence of clear source will antibiosis with vancomycin and Rocephin as I feel the patient is very unlikely to be susceptible to anaerobic infections or Pseudomonas.  Chest x-ray by my interpretation can be suggestive of a right basilar pneumonia.  CBC grossly unremarkable, including no obvious leukocytosis which is surprising.  Chemistries showing an AKI with creatinine 1.07 up from baseline of 0.55.  Given her relatively narrow pulse pressure, will add on troponin, BNP, D-dimer as dehydration and sepsis less likely to cause such a narrow pulse pressure in the setting of tachycardia.  Point-of-care echo obtained myself, extremely limited views however grossly normal.  D-dimer returning elevated at 1.8, will obtain CT PE study.  Troponin elevated at 38, also surprising but more suggestive of PE in this patient with no ACS risk factors otherwise.  EKG obtained and interpreted by myself, none prior for comparison.  Sinus tachycardia with heart rate 104, QRS and QTc appropriate.  Concerning for S1, Q3, T3 pattern suggestive of heart strain, and otherwise no evidence of ischemia or arrhythmia  Final Clinical Impression(s) / ED Diagnoses Final diagnoses:  None    Rx / DC Orders ED Discharge Orders    None     12:37 AM patient signed out to ongoing provider April Sanders and Dr. Nicanor Alcon, please see their documentation for the remainder of this patient's work-up and  ultimate disposition.  Plan at this time for CT PE study, anticipate admission for AKI, sepsis with hypotension responding to fluids and possibly PE.     Loree Fee, MD 09/09/19 Mariann Laster    Arby Barrette, MD 09/20/19 [phone]

## 2019-09-08 NOTE — ED Notes (Signed)
Charge RN notified of pts HR change and BP.

## 2019-09-08 NOTE — ED Triage Notes (Signed)
Pt noted to be hypotensive while waiting for triage. Using Spanish interpreter: Pt has been feeling sick for the past 2 weeks; NV x 2 days, fever uncontrolled with OTC medications, weakness when standing, and pain to R posterior head. LMP 2 days ago. Denies diarrhea

## 2019-09-09 ENCOUNTER — Encounter (HOSPITAL_COMMUNITY): Payer: Self-pay | Admitting: Internal Medicine

## 2019-09-09 ENCOUNTER — Emergency Department (HOSPITAL_COMMUNITY): Payer: Self-pay

## 2019-09-09 DIAGNOSIS — J1282 Pneumonia due to coronavirus disease 2019: Secondary | ICD-10-CM | POA: Diagnosis not present

## 2019-09-09 DIAGNOSIS — U071 COVID-19: Secondary | ICD-10-CM | POA: Diagnosis not present

## 2019-09-09 DIAGNOSIS — A419 Sepsis, unspecified organism: Secondary | ICD-10-CM

## 2019-09-09 DIAGNOSIS — E876 Hypokalemia: Secondary | ICD-10-CM

## 2019-09-09 DIAGNOSIS — E049 Nontoxic goiter, unspecified: Secondary | ICD-10-CM

## 2019-09-09 DIAGNOSIS — N390 Urinary tract infection, site not specified: Secondary | ICD-10-CM

## 2019-09-09 LAB — URINALYSIS, ROUTINE W REFLEX MICROSCOPIC
Bilirubin Urine: NEGATIVE
Glucose, UA: NEGATIVE mg/dL
Ketones, ur: 5 mg/dL — AB
Nitrite: POSITIVE — AB
Protein, ur: 30 mg/dL — AB
Specific Gravity, Urine: 1.038 — ABNORMAL HIGH (ref 1.005–1.030)
pH: 6 (ref 5.0–8.0)

## 2019-09-09 LAB — TROPONIN I (HIGH SENSITIVITY)
Troponin I (High Sensitivity): 18 ng/L — ABNORMAL HIGH (ref <18)
Troponin I (High Sensitivity): 11 ng/L (ref <18)

## 2019-09-09 LAB — FERRITIN: Ferritin: 305 ng/mL (ref 11–307)

## 2019-09-09 LAB — BRAIN NATRIURETIC PEPTIDE: B Natriuretic Peptide: 18.9 pg/mL (ref 0.0–100.0)

## 2019-09-09 LAB — PROCALCITONIN: Procalcitonin: <0.1 ng/mL

## 2019-09-09 LAB — HIV ANTIBODY (ROUTINE TESTING W REFLEX): HIV Screen 4th Generation wRfx: NONREACTIVE

## 2019-09-09 LAB — CBG MONITORING, ED: Glucose-Capillary: 141 mg/dL — ABNORMAL HIGH (ref 70–99)

## 2019-09-09 LAB — LACTIC ACID, PLASMA
Lactic Acid, Venous: >11 mmol/L (ref 0.5–1.9)
Lactic Acid, Venous: >11 mmol/L (ref 0.5–1.9)
Lactic Acid, Venous: 0.9 mmol/L (ref 0.5–1.9)

## 2019-09-09 LAB — TSH: TSH: 0.376 u[IU]/mL (ref 0.350–4.500)

## 2019-09-09 LAB — MAGNESIUM: Magnesium: 1.6 mg/dL — ABNORMAL LOW (ref 1.7–2.4)

## 2019-09-09 LAB — TRIGLYCERIDES: Triglycerides: 25 mg/dL (ref <150)

## 2019-09-09 LAB — HEPARIN LEVEL (UNFRACTIONATED): Heparin Unfractionated: 0.6 IU/mL (ref 0.30–0.70)

## 2019-09-09 LAB — C-REACTIVE PROTEIN: CRP: 2.9 mg/dL — ABNORMAL HIGH (ref <1.0)

## 2019-09-09 LAB — FIBRINOGEN: Fibrinogen: 157 mg/dL — ABNORMAL LOW (ref 210–475)

## 2019-09-09 LAB — LACTATE DEHYDROGENASE: LDH: 124 U/L (ref 98–192)

## 2019-09-09 LAB — T4, FREE: Free T4: 0.68 ng/dL (ref 0.61–1.12)

## 2019-09-09 LAB — SARS CORONAVIRUS 2 BY RT PCR (HOSPITAL ORDER, PERFORMED IN ~~LOC~~ HOSPITAL LAB): SARS Coronavirus 2: POSITIVE — AB

## 2019-09-09 LAB — ABO/RH: ABO/RH(D): A POS

## 2019-09-09 MED ORDER — IOHEXOL 350 MG/ML SOLN
75.0000 mL | Freq: Once | INTRAVENOUS | Status: AC | PRN
  Administered 2019-09-09: 75 mL via INTRAVENOUS

## 2019-09-09 MED ORDER — SODIUM CHLORIDE 0.9 % IV SOLN
100.0000 mg | Freq: Every day | INTRAVENOUS | Status: AC
  Administered 2019-09-10 – 2019-09-13 (×4): 100 mg via INTRAVENOUS
  Filled 2019-09-09 (×2): qty 20
  Filled 2019-09-09: qty 100
  Filled 2019-09-09: qty 20

## 2019-09-09 MED ORDER — ACETAMINOPHEN 325 MG PO TABS
650.0000 mg | ORAL_TABLET | Freq: Four times a day (QID) | ORAL | Status: DC | PRN
  Administered 2019-09-09: 650 mg via ORAL
  Filled 2019-09-09: qty 2

## 2019-09-09 MED ORDER — HYDROCOD POLST-CPM POLST ER 10-8 MG/5ML PO SUER: 5.0000 mL | Freq: Two times a day (BID) | ORAL | Status: DC | PRN

## 2019-09-09 MED ORDER — SODIUM CHLORIDE 0.9 % IV SOLN
2.0000 g | INTRAVENOUS | Status: DC
  Administered 2019-09-09 – 2019-09-12 (×4): 2 g via INTRAVENOUS
  Filled 2019-09-09 (×5): qty 20

## 2019-09-09 MED ORDER — ZINC SULFATE 220 (50 ZN) MG PO CAPS
220.0000 mg | ORAL_CAPSULE | Freq: Every day | ORAL | Status: DC
  Administered 2019-09-09 – 2019-09-13 (×5): 220 mg via ORAL
  Filled 2019-09-09 (×5): qty 1

## 2019-09-09 MED ORDER — HEPARIN SODIUM (PORCINE) 5000 UNIT/ML IJ SOLN: 60.0000 [IU]/kg | Freq: Once | INTRAMUSCULAR | Status: DC

## 2019-09-09 MED ORDER — VITAMIN D 25 MCG (1000 UNIT) PO TABS
1000.0000 [IU] | ORAL_TABLET | Freq: Every day | ORAL | Status: DC
  Administered 2019-09-09 – 2019-09-13 (×5): 1000 [IU] via ORAL
  Filled 2019-09-09 (×5): qty 1

## 2019-09-09 MED ORDER — ENOXAPARIN SODIUM 40 MG/0.4ML ~~LOC~~ SOLN
40.0000 mg | Freq: Every day | SUBCUTANEOUS | Status: DC
  Administered 2019-09-09 – 2019-09-13 (×5): 40 mg via SUBCUTANEOUS
  Filled 2019-09-09 (×5): qty 0.4

## 2019-09-09 MED ORDER — HEPARIN (PORCINE) 25000 UT/250ML-% IV SOLN
950.0000 [IU]/h | INTRAVENOUS | Status: DC
  Administered 2019-09-09: 950 [IU]/h via INTRAVENOUS
  Filled 2019-09-09: qty 250

## 2019-09-09 MED ORDER — GUAIFENESIN-DM 100-10 MG/5ML PO SYRP
10.0000 mL | ORAL_SOLUTION | ORAL | Status: DC | PRN
  Filled 2019-09-09 (×2): qty 10

## 2019-09-09 MED ORDER — HEPARIN BOLUS VIA INFUSION
4000.0000 [IU] | Freq: Once | INTRAVENOUS | Status: AC
  Administered 2019-09-09: 4000 [IU] via INTRAVENOUS
  Filled 2019-09-09: qty 4000

## 2019-09-09 MED ORDER — HEPARIN (PORCINE) 25000 UT/250ML-% IV SOLN: 1350.0000 [IU]/h | INTRAVENOUS | Status: DC

## 2019-09-09 MED ORDER — HEPARIN BOLUS VIA INFUSION
5000.0000 [IU] | Freq: Once | INTRAVENOUS | Status: DC
  Filled 2019-09-09: qty 5000

## 2019-09-09 MED ORDER — METHYLPREDNISOLONE SODIUM SUCC 40 MG IJ SOLR
40.0000 mg | Freq: Once | INTRAMUSCULAR | Status: AC
  Administered 2019-09-09: 40 mg via INTRAVENOUS
  Filled 2019-09-09: qty 1

## 2019-09-09 MED ORDER — METHYLPREDNISOLONE SODIUM SUCC 125 MG IJ SOLR
0.5000 mg/kg | Freq: Two times a day (BID) | INTRAMUSCULAR | Status: DC
  Administered 2019-09-09 – 2019-09-10 (×3): 40 mg via INTRAVENOUS
  Filled 2019-09-09 (×3): qty 2

## 2019-09-09 MED ORDER — POTASSIUM CHLORIDE CRYS ER 20 MEQ PO TBCR
40.0000 meq | EXTENDED_RELEASE_TABLET | Freq: Once | ORAL | Status: AC
  Administered 2019-09-09: 40 meq via ORAL
  Filled 2019-09-09: qty 2

## 2019-09-09 MED ORDER — SODIUM CHLORIDE 0.9 % IV SOLN
200.0000 mg | Freq: Once | INTRAVENOUS | Status: AC
  Administered 2019-09-09: 200 mg via INTRAVENOUS
  Filled 2019-09-09: qty 40

## 2019-09-09 MED ORDER — ASCORBIC ACID 500 MG PO TABS
500.0000 mg | ORAL_TABLET | Freq: Every day | ORAL | Status: DC
  Administered 2019-09-09 – 2019-09-13 (×5): 500 mg via ORAL
  Filled 2019-09-09 (×5): qty 1

## 2019-09-09 NOTE — ED Notes (Signed)
Patient transported to CT 

## 2019-09-09 NOTE — ED Notes (Signed)
Lunch delivered. 

## 2019-09-09 NOTE — Progress Notes (Addendum)
Patient is a 44 year old female with no significant medical history presented to the ER with fever and malaise.  Symptomatology for about 2 weeks.  In the emergency room, tachycardic and tachypneic.  Hypotensive on arrival with lactic acid of more than 11.  Systolic blood pressure 80.  On room air.  COVID-19 positive.  Electrolyte abnormalities.  UA with 21-50 WBCs.  Urine cultures and blood cultures drawn. CTA of the chest negative for PE, multifocal bilateral consolidation, substernal extension of thyroid goiter.  Patient on room air.  Pneumonia due to COVID-19 virus: Patient presented with severe sepsis due to COVID-19 virus infection.  Patient is on room air.  She has multifocal bilateral consolidation.  We will continue Covid directed therapy with remdesivir and steroids.  Severe sepsis: Lactic acid more than 11, hypotension on arrival probably due to viral pneumonia.  Also suspected UTI, less likely. Likely all from COVID-19 infection. Received broad-spectrum antibiotics as sepsis protocol with IV fluids boluses in the ER.  Received ceftriaxone and vancomycin. We will continue ceftriaxone.  Hypokalemia and hypomagnesemia: Replace and monitor levels.  Thyroid goiter, substernal extension: Chronic.  Demands attention and may be surgical removal.  Patient will need referral to ENT on discharge.  Do not anticipate goiter removal when patient is suffering from COVID-19 pneumonia and sepsis.  I called patient's son and updated him.  Discussed about vaccination status and he is stated "we do not take vaccine and do not plan to do it in the future".  I tried to educate but they will not take vaccine.  No reason stated.  Informed about the retrosternal goiter and need ENT follow up.   Please refer to community clinic and ENT on discharge .

## 2019-09-09 NOTE — H&P (Signed)
History and Physical    Teresa Orozco ASN:[id number] DOB: 13-Jan-1976 DOA: 09/08/2019  PCP: Patient, No Pcp Per Patient coming from: Home  Chief Complaint: Fevers  HPI: Teresa Orozco is a 44 y.o. female with no significant past medical history presenting with complaints of fevers and malaise.  Spanish interpreter services used.  Patient reports feeling sick for the past 2 weeks.  She is having fevers, malaise, and cough.  Denies having shortness of breath, chest pain, abdominal pain, diarrhea, or dysuria.  States she vomited after taking Tylenol at home but is not vomiting otherwise and not nauseous.  She has not been vaccinated against Covid.  No additional history could be obtained from her.  ED Course: Afebrile.  Tachycardic and tachypneic.  Hypotensive on arrival to the ED with systolic in the 19F.  Not hypoxic.  SARS-CoV-2 PCR test positive.  CBC unremarkable.  Initial lactic acid normal, repeat >11.0. Sodium 132, potassium 3.2, chloride 98, bicarb 21, BUN 20, creatinine 1.0, and glucose 117.  Transaminases elevated (AST 100, ALT 95).  Alk phos and T bili normal.  Beta hCG negative.  High-sensitivity troponin 38 >18 >11.  BNP normal.  D-dimer 1.8.  CRP 2.9.  Procalcitonin pending.  UA with positive nitrite, small amount of leukocytes, 21-50 WBCs, and few bacteria.  Urine culture pending.  Blood culture x2 pending.  Chest x-ray showing patchy airspace opacities in the right mid and lower lung zone and questionably at the left lung base, suspicious for pneumonia.  Started on heparin empirically prior to CT angiogram.  CT angiogram chest negative for PE.  Showing extensive multifocal consolidation and scattered areas of groundglass pulmonary infiltrate consistent with COVID-19 viral multifocal pneumonia.  CT also showing thyroid goiter extending substernally and widens the mediastinum at the thoracic inlet with mild mass-effect upon the airway.  Patient received Solu-Medrol, Zofran, ceftriaxone,  vancomycin, and 2 L LR boluses.  Review of Systems:  All systems reviewed and apart from history of presenting illness, are negative.  Past Medical History:  Diagnosis Date  . Medical history non-contributory     Past Surgical History:  Procedure Laterality Date  . DILATION AND CURETTAGE OF UTERUS  2008     reports that she has never smoked. She has never used smokeless tobacco. She reports that she does not drink alcohol and does not use drugs.  No Known Allergies  History reviewed. No pertinent family history.  Prior to Admission medications   Medication Sig Start Date End Date Taking? Authorizing Provider  acetaminophen (TYLENOL) 325 MG tablet Take 650 mg by mouth every 6 (six) hours as needed for fever.   Yes [provider]  ibuprofen (ADVIL) 200 MG tablet Take 200 mg by mouth every 6 (six) hours as needed for fever.    Yes [provider]  fluconazole (DIFLUCAN) 150 MG tablet Take 1 tablet (150 mg total) by mouth daily. Patient not taking: Reported on 08/27/2018 09/20/17   Starr Lake, CNM  medroxyPROGESTERone (DEPO-PROVERA) 150 MG/ML injection Inject 1 mL (150 mg total) into the muscle every 3 (three) months. Patient not taking: Reported on 08/27/2018 09/20/17   Starr Lake, CNM  Prenatal Multivit-Min-Fe-FA (PRENATAL VITAMINS) 0.8 MG tablet Take 1 tablet by mouth daily. Patient not taking: Reported on 08/27/2018 01/23/17   Chancy Milroy, MD    Physical Exam: Vitals:   09/09/19 0215 09/09/19 0230 09/09/19 0315 09/09/19 0330  BP: 115/77 113/76 114/68 105/65  Pulse: (!) 101 (!) 103 (!) 102 98  Resp: (!) 32 (!) 29 (!) 26 (!) 27  Temp:      SpO2: 94% 95% 96% 96%  Weight:      Height:        Physical Exam Constitutional:      Appearance: She is ill-appearing.  HENT:     Head: Normocephalic and atraumatic.  Eyes:     Extraocular Movements: Extraocular movements intact.     Conjunctiva/sclera: Conjunctivae normal.    Cardiovascular:     Rate and Rhythm: Regular rhythm. Tachycardia present.     Pulses: Normal pulses.     Comments: Heart rate in the 110s Pulmonary:     Breath sounds: Rales present.     Comments: Tachypneic with respiratory rate 20-30 Speaking clearly in full sentences No wheezing or stridor Abdominal:     General: Bowel sounds are normal. There is no distension.     Palpations: Abdomen is soft.     Tenderness: There is no abdominal tenderness.  Musculoskeletal:        General: No swelling or tenderness.     Cervical back: Normal range of motion and neck supple.  Skin:    General: Skin is warm and dry.  Neurological:     General: No focal deficit present.     Mental Status: She is alert and oriented to person, place, and time.     Labs on Admission: I have personally reviewed following labs and imaging studies  CBC: Recent Labs  Lab 09/08/19 2208  WBC 6.6  NEUTROABS 5.6  HGB 14.5  HCT 44.0  MCV 90.0  PLT 703   Basic Metabolic Panel: Recent Labs  Lab 09/08/19 2208  NA 132*  K 3.2*  CL 98  CO2 21*  GLUCOSE 117*  BUN 20  CREATININE 1.07*  CALCIUM 8.3*   GFR: Estimated Creatinine Clearance: 71.7 mL/min (A) (by C-G formula based on SCr of 1.07 mg/dL (H)). Liver Function Tests: Recent Labs  Lab 09/08/19 2208  AST 100*  ALT 95*  ALKPHOS 109  BILITOT 0.7  PROT 7.3  ALBUMIN 3.3*   No results for input(s): LIPASE, AMYLASE in the last 168 hours. No results for input(s): AMMONIA in the last 168 hours. Coagulation Profile: No results for input(s): INR, PROTIME in the last 168 hours. Cardiac Enzymes: No results for input(s): CKTOTAL, CKMB, CKMBINDEX, TROPONINI in the last 168 hours. BNP (last 3 results) No results for input(s): PROBNP in the last 8760 hours. HbA1C: No results for input(s): HGBA1C in the last 72 hours. CBG: No results for input(s): GLUCAP in the last 168 hours. Lipid Profile: Recent Labs    09/09/19 0158  TRIG 25   Thyroid Function  Tests: No results for input(s): TSH, T4TOTAL, FREET4, T3FREE, THYROIDAB in the last 72 hours. Anemia Panel: Recent Labs    09/09/19 0158  FERRITIN 305   Urine analysis:    Component Value Date/Time   COLORURINE YELLOW 09/09/2019 0250   APPEARANCEUR HAZY (A) 09/09/2019 0250   LABSPEC 1.038 (H) 09/09/2019 0250   PHURINE 6.0 09/09/2019 0250   GLUCOSEU NEGATIVE 09/09/2019 0250   HGBUR SMALL (A) 09/09/2019 0250   HGBUR small 01/22/2007 0840   BILIRUBINUR NEGATIVE 09/09/2019 0250   KETONESUR 5 (A) 09/09/2019 0250   PROTEINUR 30 (A) 09/09/2019 0250   UROBILINOGEN 0.2 07/03/2017 1027   NITRITE POSITIVE (A) 09/09/2019 0250   LEUKOCYTESUR SMALL (A) 09/09/2019 0250    Radiological Exams on Admission: DG Chest 1 View  Result Date: 09/08/2019 CLINICAL DATA:  Fever, fall, dizziness.  Questionable sepsis. EXAM: CHEST  1 VIEW COMPARISON:  07/11/2013 FINDINGS: Patchy airspace opacities in the right mid lower lung zone. Questionable mild patchy opacity at the left lung base. Heart is normal in size with normal mediastinal contours. No pleural fluid or pneumothorax. No acute osseous abnormalities are seen. IMPRESSION: Patchy airspace opacities in the right mid and lower lung zone and questionably at the left lung base, suspicious for pneumonia in the setting of fever. Electronically Signed   By: Keith Rake M.D.   On: 09/08/2019 22:05   CT Angio Chest PE W and/or Wo Contrast  Result Date: 09/09/2019 CLINICAL DATA:  Nausea, vomiting, fever, dyspnea EXAM: CT ANGIOGRAPHY CHEST WITH CONTRAST TECHNIQUE: Multidetector CT imaging of the chest was performed using the standard protocol during bolus administration of intravenous contrast. Multiplanar CT image reconstructions and MIPs were obtained to evaluate the vascular anatomy. CONTRAST:  65m OMNIPAQUE IOHEXOL 350 MG/ML SOLN COMPARISON:  None. FINDINGS: Cardiovascular: There is excellent opacification of the pulmonary arterial tree. No intraluminal  filling defect to suggest acute pulmonary embolism. Central pulmonary arteries are of normal caliber. Cardiac size within normal limits. No significant coronary artery calcification. No pericardial effusion. Thoracic aorta is of normal caliber. 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 Shotty mediastinal and right hilar adenopathy may be reactive in nature. No frankly pathologic thoracic adenopathy. The esophagus is unremarkable. Lungs/Pleura: There is extensive multifocal consolidation and scattered areas of ground-glass pulmonary infiltrate in keeping with atypical infection in the appropriate clinical setting. The findings are compatible with acute COVID-19 pneumonia. There is no pneumothorax or pleural effusion. The central airways are widely patent. Upper Abdomen: No acute abnormality. Musculoskeletal: No chest wall abnormality. No acute or significant osseous findings. Review of the MIP images confirms the above findings. IMPRESSION: 1. No evidence of acute pulmonary embolism. 2. Extensive multifocal consolidation and scattered areas of ground-glass pulmonary infiltrate in keeping with acute COVID-19 pneumonia in the appropriate clinical setting. 3. 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 Electronically Signed   By: AFidela SalisburyMD   On: 09/09/2019 00:50    EKG: Independently reviewed.  Sinus tachycardia, S1Q3T3.  No prior tracing for comparison.  Assessment/Plan Principal Problem:   Pneumonia due to COVID-19 virus Active Problems:   UTI (urinary tract infection)   Severe sepsis (HCC)   Hypokalemia   Goiter   COVID-19 viral multifocal pneumonia: Tachycardic and tachypneic.  Hypotensive on arrival to the ED with systolic in the 896Q blood pressure now improved after 2 L fluid boluses.  Not hypoxic-currently  satting in the mid to high 90s on room air.  SARS-CoV-2 PCR test positive.  No leukocytosis.  Inflammatory markers elevated: D-dimer 1.8, CRP 2.9. CT angiogram chest negative for PE.  Showing extensive multifocal consolidation and scattered areas of groundglass pulmonary infiltrate consistent with COVID-19 viral multifocal pneumonia.  -Received IV fluid boluses per sepsis protocol, currently normotensive -Remdesivir dosing per pharmacy -IV Solu-Medrol 0.5 mg/kg every 12 hours -Vitamin C, zinc, vitamin D -Antitussives as needed -Procalcitonin level pending -Repeat lactic acid level -Daily CBC with differential, CMP, CRP, D-dimer -Encourage prone positioning -Incentive spirometry, flutter valve -Airborne and contact precautions -Continuous pulse ox -Supplemental oxygen as needed to keep oxygen saturation above 90% -Blood culture x2 pending  UTI: No leukocytosis on labs. UA with positive nitrite, small amount of leukocytes,  21-50 WBCs, and few bacteria. -Continue ceftriaxone.  Urine culture pending.  Severe sepsis: In the setting of COVID-19 viral multifocal pneumonia and UTI.  Lactate above 11.  No leukocytosis on labs. -Received IV fluid boluses per sepsis protocol, hypotension resolved.  Repeat lactic acid level.  Procalcitonin level pending.  Check stat ABG to check for possible type A lactic acidosis from hypoxia.  Blood culture x2 pending.  Management of COVID-19 pneumonia and UTI as mentioned above.  Mild hypokalemia: Likely due to poor oral intake in the setting of acute viral illness.  Potassium 3.2. -Replete potassium.  Check magnesium level and replete if low.  Continue to monitor electrolytes.  Transaminitis: Likely related to COVID-19 viral infection.  AST 100, ALT 95.  Patient denies history of alcohol use. -Continue to monitor LFTs  Obstructive goiter: CT showing thyroid goiter extending substernally and widens the mediastinum at the thoracic inlet with mild mass-effect upon  the airway.  No wheezing or stridor on exam. -Check TSH and free T4 levels.  Consult ENT in a.m. to discuss need for thyroidectomy.  DVT prophylaxis: Lovenox Code Status: Full code Family Communication: No family available at this time. Disposition Plan: Status is: Inpatient  Remains inpatient appropriate because:IV treatments appropriate due to intensity of illness or inability to take PO and Inpatient level of care appropriate due to severity of illness   Dispo: The patient is from: Home              Anticipated d/c is to: Home              Anticipated d/c date is: > 3 days              Patient currently is not medically stable to d/c.  The medical decision making on this patient was of high complexity and the patient is at high risk for clinical deterioration, therefore this is a level 3 visit.  Shela Leff MD [facility]  If 7PM-7AM, please contact night-coverage [email or web address]  09/09/2019, 4:45 AM

## 2019-09-09 NOTE — Progress Notes (Addendum)
ANTICOAGULATION CONSULT NOTE - Initial Consult  Pharmacy Consult for Heparin Indication: r/o ACS  No Known Allergies  Patient Measurements: Height: 5\' 6"  (167.6 cm) Weight: 80.3 kg (177 lb) IBW/kg (Calculated) : 59.3 Heparin Dosing Weight: 80 kg  Vital Signs: Temp: 99 F (37.2 C) (08/23 2105) BP: 100/60 (08/23 2330) Pulse Rate: 104 (08/23 2330)  Labs: Recent Labs    09/08/19 2208 09/08/19 2225  HGB 14.5  --   HCT 44.0  --   PLT 203  --   CREATININE 1.07*  --   TROPONINIHS  --  38*    Estimated Creatinine Clearance: 71.7 mL/min (A) (by C-G formula based on SCr of 1.07 mg/dL (H)).   Medical History: Past Medical History:  Diagnosis Date  . Medical history non-contributory     Medications:  See electronic med rec  Assessment: 44 y.o. F presents with fever, weakness, malaise. To begin heparin for r/o PE. CBC ok on admission. No AC PTA.  Goal of Therapy:  Heparin level 0.3-0.7 units/ml Monitor platelets by anticoagulation protocol: Yes   Plan:  Heparin IV bolus 5000 units Heparin gtt at 1350 units/hr Will f/u heparin level in 6 hours Daily heparin level and CBC  59, PharmD, BCPS Please see amion for complete clinical pharmacist phone list 09/09/2019,12:11 AM   Addendum (0110): CT scan back and negative for PE. Trop up to 38, MD would like to continue heparin for r/o ACS. Heparin bolus and gtt not given yet.  Plan: Heparin 4000 units now then 950 units/hr. F/u heparin level in 6 hours  Daily heparin level and CBC  09/11/2019, PharmD, BCPS Please see amion for complete clinical pharmacist phone list 09/09/2019 1:09 AM

## 2019-09-09 NOTE — Progress Notes (Signed)
ABG stick unsuccessful

## 2019-09-09 NOTE — Progress Notes (Signed)
This RT is second to attempt ABG on pt unsuccessfully.  Pt on RA with sat of 93% and in no distress and  without reason to attempt ABG stick again at this time. RT tried to locate MD to follow up without finding her.  RT will continue to monitor.

## 2019-09-10 ENCOUNTER — Other Ambulatory Visit: Payer: Self-pay

## 2019-09-10 ENCOUNTER — Encounter (HOSPITAL_COMMUNITY): Payer: Self-pay | Admitting: Internal Medicine

## 2019-09-10 DIAGNOSIS — J1282 Pneumonia due to coronavirus disease 2019: Secondary | ICD-10-CM | POA: Diagnosis not present

## 2019-09-10 DIAGNOSIS — U071 COVID-19: Secondary | ICD-10-CM | POA: Diagnosis not present

## 2019-09-10 LAB — COMPREHENSIVE METABOLIC PANEL
ALT: 52 U/L — ABNORMAL HIGH (ref 0–44)
AST: 46 U/L — ABNORMAL HIGH (ref 15–41)
Albumin: 2.5 g/dL — ABNORMAL LOW (ref 3.5–5.0)
Alkaline Phosphatase: 86 U/L (ref 38–126)
Anion gap: 10 (ref 5–15)
BUN: 14 mg/dL (ref 6–20)
CO2: 22 mmol/L (ref 22–32)
Calcium: 8 mg/dL — ABNORMAL LOW (ref 8.9–10.3)
Chloride: 107 mmol/L (ref 98–111)
Creatinine, Ser: 0.62 mg/dL (ref 0.44–1.00)
GFR calc Af Amer: >60 mL/min (ref >60)
GFR calc non Af Amer: >60 mL/min (ref >60)
Glucose, Bld: 143 mg/dL — ABNORMAL HIGH (ref 70–99)
Potassium: 4.1 mmol/L (ref 3.5–5.1)
Sodium: 139 mmol/L (ref 135–145)
Total Bilirubin: 0.4 mg/dL (ref 0.3–1.2)
Total Protein: 6.2 g/dL — ABNORMAL LOW (ref 6.5–8.1)

## 2019-09-10 LAB — CBC WITH DIFFERENTIAL/PLATELET
Abs Immature Granulocytes: 0.04 10*3/uL (ref 0.00–0.07)
Basophils Absolute: 0 10*3/uL (ref 0.0–0.1)
Basophils Relative: 0 %
Eosinophils Absolute: 0 10*3/uL (ref 0.0–0.5)
Eosinophils Relative: 0 %
HCT: 39.3 % (ref 36.0–46.0)
Hemoglobin: 12.7 g/dL (ref 12.0–15.0)
Immature Granulocytes: 1 %
Lymphocytes Relative: 22 %
Lymphs Abs: 1 10*3/uL (ref 0.7–4.0)
MCH: 29.8 pg (ref 26.0–34.0)
MCHC: 32.3 g/dL (ref 30.0–36.0)
MCV: 92.3 fL (ref 80.0–100.0)
Monocytes Absolute: 0.4 10*3/uL (ref 0.1–1.0)
Monocytes Relative: 8 %
Neutro Abs: 3.2 10*3/uL (ref 1.7–7.7)
Neutrophils Relative %: 69 %
Platelets: 274 10*3/uL (ref 150–400)
RBC: 4.26 MIL/uL (ref 3.87–5.11)
RDW: 14.4 % (ref 11.5–15.5)
WBC: 4.6 10*3/uL (ref 4.0–10.5)
nRBC: 0 % (ref 0.0–0.2)

## 2019-09-10 LAB — HEPATITIS PANEL, ACUTE
HCV Ab: NONREACTIVE
Hep A IgM: NONREACTIVE
Hep B C IgM: NONREACTIVE
Hepatitis B Surface Ag: NONREACTIVE

## 2019-09-10 LAB — URINE CULTURE: Culture: NO GROWTH

## 2019-09-10 LAB — C-REACTIVE PROTEIN: CRP: 6.7 mg/dL — ABNORMAL HIGH (ref <1.0)

## 2019-09-10 LAB — D-DIMER, QUANTITATIVE: D-Dimer, Quant: 0.87 ug/mL-FEU — ABNORMAL HIGH (ref 0.00–0.50)

## 2019-09-10 LAB — MAGNESIUM: Magnesium: 1.9 mg/dL (ref 1.7–2.4)

## 2019-09-10 LAB — PATHOLOGIST SMEAR REVIEW: Path Review: REACTIVE

## 2019-09-10 MED ORDER — DEXAMETHASONE 6 MG PO TABS
6.0000 mg | ORAL_TABLET | Freq: Every day | ORAL | Status: DC
  Administered 2019-09-11 – 2019-09-13 (×3): 6 mg via ORAL
  Filled 2019-09-10 (×3): qty 1

## 2019-09-10 NOTE — ED Notes (Signed)
Report called to [facility], RN.  Pt to go to 3E18 via stretcher w/RN and tech.

## 2019-09-10 NOTE — Progress Notes (Addendum)
PROGRESS NOTE    Teresa Orozco  VZD:[id number] DOB: May 31, 1975 DOA: 09/08/2019 PCP: Patient, No Pcp Per   Chief Complaint  Patient presents with  . Fever    Brief Narrative:   Teresa Orozco is Teresa Orozco 44 y.o. female with no significant past medical history presenting with complaints of fevers and malaise.  Spanish interpreter services used.  Patient reports feeling sick for the past 2 weeks.  She is having fevers, malaise, and cough.  Denies having shortness of breath, chest pain, abdominal pain, diarrhea, or dysuria.  States she vomited after taking Tylenol at home but is not vomiting otherwise and not nauseous.  She has not been vaccinated against Covid.  She was septic on admission with severe lactic acidosis, which has now resolved.    Assessment & Plan:   Principal Problem:   Pneumonia due to COVID-19 virus Active Problems:   UTI (urinary tract infection)   Severe sepsis (HCC)   Hypokalemia   Goiter  Acute Hypoxic Respiratory Failure 2/2 COVID-19 viral multifocal pneumonia:  Unvaccinated  CXR with patchy airspace opacities in R mid and lower lung zone CT PE protocol with multifocal consolidation and scattered areas of ground glass pulm infiltrate On 2 L overnight.  Currently satting well on RA. Continue steroids, remdesivir Strict I/O, daily weights  Daily inflammatory labs OOB, prone as able, IS, flutter Potentially d/c home with plans for outpatient remdesivir vs complete course here  COVID-19 Labs  Recent Labs    09/08/19 2318 09/09/19 0158 09/10/19 0500  DDIMER 1.80*  --  0.87*  FERRITIN  --  305  --   LDH  --  124  --   CRP  --  2.9* 6.7*    Lab Results  Component Value Date   SARSCOV2NAA POSITIVE (Teresa Orozco) 09/08/2019   Severe sepsis: In the setting of COVID-19 viral multifocal pneumonia and possible UTI?  Lactate above 11 at presentation. Procalcitonin < 0.1 UA with positive nitrites, small LE, few bacteria, 21-50 WBC concerning for UTI, abx given before urine  collected Urine cx no growth Blood cx no growth, continue to monitor    UTI: No leukocytosis on labs. UA with positive nitrite, small amount of leukocytes, 21-50 WBCs, and few bacteria. -Continue ceftriaxone.  urine cx no growth, but obtained after abx.  Mild hypokalemia  Hypomagnesemia: resolved  Transaminitis: Likely related to COVID-19 viral infection.  Patient denies history of alcohol use. 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  No wheezing or stridor on exam. -Check TSH and free T4 levels (wnl) -will need ENT follow up at time of discharge  DVT prophylaxis: lovenox Code Status: full  Family Communication: none at bedside Disposition:   Status is: Inpatient  Remains inpatient appropriate because:Inpatient level of care appropriate due to severity of illness   Dispo: The patient is from: Home              Anticipated d/c is to: Home              Anticipated d/c date is: > 3 days              Patient currently is not medically stable to d/c.  Consultants:   none  Procedures:   none  Antimicrobials:  Anti-infectives (From admission, onward)   Start     Dose/Rate Route Frequency Ordered Stop  09/10/19 1000  remdesivir 100 mg in sodium chloride 0.9 % 100 mL IVPB       "Followed by" Linked Group Details   100 mg 200 mL/hr over 30 Minutes Intravenous Daily 09/09/19 0452 09/14/19 0959   09/09/19 2200  cefTRIAXone (ROCEPHIN) 2 g in sodium chloride 0.9 % 100 mL IVPB        2 g 200 mL/hr over 30 Minutes Intravenous Every 24 hours 09/09/19 0452     09/09/19 0600  remdesivir 200 mg in sodium chloride 0.9% 250 mL IVPB       "Followed by" Linked Group Details   200 mg 580 mL/hr over 30 Minutes Intravenous Once 09/09/19 0452 09/09/19 0710   09/08/19 2245  cefTRIAXone (ROCEPHIN) 2 g in  sodium chloride 0.9 % 100 mL IVPB        2 g 200 mL/hr over 30 Minutes Intravenous  Once 09/08/19 2231 09/09/19 0056   09/08/19 2230  ceFEPIme (MAXIPIME) 2 g in sodium chloride 0.9 % 100 mL IVPB  Status:  Discontinued        2 g 200 mL/hr over 30 Minutes Intravenous  Once 09/08/19 2227 09/08/19 2229   09/08/19 2230  cefTRIAXone (ROCEPHIN) 1 g in sodium chloride 0.9 % 100 mL IVPB  Status:  Discontinued        1 g 200 mL/hr over 30 Minutes Intravenous  Once 09/08/19 2229 09/08/19 2231   09/08/19 2230  vancomycin (VANCOREADY) IVPB 1750 mg/350 mL        1,750 mg 175 mL/hr over 120 Minutes Intravenous  Once 09/08/19 2229 09/09/19 0316         Subjective: Feeling better Denies SOB Appetite is better  Objective: Vitals:   09/10/19 0600 09/10/19 0720 09/10/19 1200 09/10/19 1300  BP: 103/69  104/71 108/70  Pulse: 93  91 95  Resp:   17 (!) 26  Temp:      TempSrc:      SpO2: 91% 93% 93% 90%  Weight:      Height:       No intake or output data in the 24 hours ending 09/10/19 1337 Filed Weights   09/09/19 0000  Weight: 80.3 kg    Examination:  General exam: Appears calm and comfortable  Respiratory system: unlabored Cardiovascular system: RRR Gastrointestinal system: Abdomen is nondistended, soft and nontender Central nervous system: Alert and oriented. No focal neurological deficits. Extremities: no LEE Skin: No rashes, lesions or ulcers Psychiatry: Judgement and insight appear normal. Mood & affect appropriate.     Data Reviewed: I have personally reviewed following labs and imaging studies  CBC: Recent Labs  Lab 09/08/19 2208 09/10/19 0500  WBC 6.6 4.6  NEUTROABS 5.6 3.2  HGB 14.5 12.7  HCT 44.0 39.3  MCV 90.0 92.3  PLT 203 274    Basic Metabolic Panel: Recent Labs  Lab 09/08/19 2208 09/09/19 0527 09/10/19 0500  NA 132*  --  139  K 3.2*  --  4.1  CL 98  --  107  CO2 21*  --  22  GLUCOSE 117*  --  143*  BUN 20  --  14  CREATININE 1.07*  --  0.62    CALCIUM 8.3*  --  8.0*  MG  --  1.6* 1.9    GFR: Estimated Creatinine Clearance: 95.9 mL/min (by C-G formula based on SCr of 0.62 mg/dL).  Liver Function Tests: Recent Labs  Lab 09/08/19 2208 09/10/19 0500  AST 100* 46*  ALT 95* 52*  ALKPHOS 109 86  BILITOT 0.7 0.4  PROT 7.3 6.2*  ALBUMIN 3.3* 2.5*    CBG: Recent Labs  Lab 09/09/19 1500  GLUCAP 141*     Recent Results (from the past 240 hour(s))  Blood culture (routine x 2)     Status: None (Preliminary result)   Collection Time: 09/08/19 10:08 PM   Specimen: BLOOD  Result Value Ref Range Status   Specimen Description BLOOD RIGHT ANTECUBITAL  Final   Special Requests   Final    BOTTLES DRAWN AEROBIC AND ANAEROBIC Blood Culture adequate volume   Culture   Final    NO GROWTH 2 DAYS Performed at [facility], [address], [place], [place] [address]    Report Status PENDING  Incomplete  SARS Coronavirus 2 by RT PCR (hospital order, performed in [facility] hospital lab) Nasopharyngeal Nasopharyngeal Swab     Status: Abnormal   Collection Time: 09/08/19 10:18 PM   Specimen: Nasopharyngeal Swab  Result Value Ref Range Status   SARS Coronavirus 2 POSITIVE (Teresa Orozco) NEGATIVE Final    Comment: RESULT CALLED TO, READ BACK BY AND VERIFIED WITH: RN M COCHRANE @0027  09/09/19 BY S GEZAHEGN (NOTE) SARS-CoV-2 target nucleic acids are DETECTED  SARS-CoV-2 RNA is generally detectable in upper respiratory specimens  during the acute phase of infection.  Positive results are indicative  of the presence of the identified virus, but do not rule out bacterial infection or co-infection with other pathogens not detected by the test.  Clinical correlation with patient history and  other diagnostic information is necessary to determine patient infection status.  The expected result is negative.  Fact Sheet for Patients:   09/11/19   Fact Sheet for Healthcare Providers:    BoilerBrush.com.cy    This test is not yet approved or cleared by the https://pope.com/ FDA and  has been authorized for detection and/or diagnosis of SARS-CoV-2 by FDA under an Emergency Use Authorization (EUA).  This EUA will remain in effect (meaning  this test can be used) for the duration of  the COVID-19 declaration under Section 564(b)(1) of the Act, 21 U.S.C. section 360-bbb-3(b)(1), unless the authorization is terminated or revoked sooner.  Performed at [facility], [address], [place], [place]   Blood culture (routine x 2)     Status: None (Preliminary result)   Collection Time: 09/08/19 10:19 PM   Specimen: BLOOD RIGHT HAND  Result Value Ref Range Status   Specimen Description BLOOD RIGHT HAND  Final   Special Requests   Final    BOTTLES DRAWN AEROBIC ONLY Blood Culture results may not be optimal due to an inadequate volume of blood received in culture bottles   Culture   Final    NO GROWTH 2 DAYS Performed at [facility], [address] [place], [place]    Report Status PENDING  Incomplete  Urine culture     Status: None   Collection Time: 09/09/19  2:43 AM   Specimen: In/Out Cath Urine  Result Value Ref Range Status   Specimen Description IN/OUT CATH URINE  Final   Special Requests NONE  Final   Culture   Final    NO GROWTH Performed at [facility], [address], [place], [place]    Report Status 09/10/2019 FINAL  Final         Radiology Studies: DG Chest 1 View  Result Date: 09/08/2019 CLINICAL DATA:  Fever, fall,  dizziness.  Questionable sepsis. EXAM: CHEST  1 VIEW COMPARISON:  07/11/2013 FINDINGS: Patchy airspace opacities in the right mid lower lung zone. Questionable mild patchy opacity at the left lung base. Heart is normal in size with normal mediastinal contours. No pleural fluid or pneumothorax. No acute osseous abnormalities are seen. IMPRESSION: Patchy airspace opacities  in the right mid and lower lung zone and questionably at the left lung base, suspicious for pneumonia in the setting of fever. Electronically Signed   By: Narda Rutherford M.D.   On: 09/08/2019 22:05   CT Angio Chest PE W and/or Wo Contrast  Result Date: 09/09/2019 CLINICAL DATA:  Nausea, vomiting, fever, dyspnea EXAM: CT ANGIOGRAPHY CHEST WITH CONTRAST TECHNIQUE: Multidetector CT imaging of the chest was performed using the standard protocol during bolus administration of intravenous contrast. Multiplanar CT image reconstructions and MIPs were obtained to evaluate the vascular anatomy. CONTRAST:  23mL OMNIPAQUE IOHEXOL 350 MG/ML SOLN COMPARISON:  None. FINDINGS: Cardiovascular: There is excellent opacification of the pulmonary arterial tree. No intraluminal filling defect to suggest acute pulmonary embolism. Central pulmonary arteries are of normal caliber. Cardiac size within normal limits. No significant coronary artery calcification. No pericardial effusion. Thoracic aorta is of normal caliber. 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 Shotty mediastinal and right hilar adenopathy may be reactive in nature. No frankly pathologic thoracic adenopathy. The esophagus is unremarkable. Lungs/Pleura: There is extensive multifocal consolidation and scattered areas of ground-glass pulmonary infiltrate in keeping with atypical infection in the appropriate clinical setting. The findings are compatible with acute COVID-19 pneumonia. There is no pneumothorax or pleural effusion. The central airways are widely patent. Upper Abdomen: No acute abnormality. Musculoskeletal: No chest wall abnormality. No acute or significant osseous findings. Review of the MIP images confirms the above findings. IMPRESSION: 1. No evidence of acute pulmonary embolism. 2. Extensive multifocal consolidation and scattered areas of ground-glass  pulmonary infiltrate in keeping with acute COVID-19 pneumonia in the appropriate clinical setting. 3. 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 Electronically Signed   By: Helyn Numbers MD   On: 09/09/2019 00:50        Scheduled Meds: . vitamin C  500 mg Oral Daily  . cholecalciferol  1,000 Units Oral Daily  . enoxaparin (LOVENOX) injection  40 mg Subcutaneous Q0600  . methylPREDNISolone (SOLU-MEDROL) injection  0.5 mg/kg Intravenous Q12H  . zinc sulfate  220 mg Oral Daily   Continuous Infusions: . cefTRIAXone (ROCEPHIN)  IV Stopped (09/09/19 2300)  . remdesivir 100 mg in NS 100 mL Stopped (09/10/19 1102)     LOS: 1 day    Time spent: over 30 min    Lacretia Nicks, MD [facility]   To contact the attending provider between 7A-7P or the covering provider during after hours 7P-7A, please log into the web site [email or web address] and access using universal [place] password for that web site. If you do not have the password, please call the hospital operator.  09/10/2019, 1:37 PM

## 2019-09-10 NOTE — ED Notes (Signed)
Ordered breakfast 

## 2019-09-10 NOTE — Plan of Care (Signed)
Plan of care initiated.

## 2019-09-10 NOTE — ED Notes (Signed)
Pt O2 keeps dropping to 88% ra, put pt on 2L [place], pt denies dyspnea or sob

## 2019-09-11 ENCOUNTER — Inpatient Hospital Stay (HOSPITAL_COMMUNITY): Payer: HRSA Program

## 2019-09-11 DIAGNOSIS — U071 COVID-19: Secondary | ICD-10-CM

## 2019-09-11 DIAGNOSIS — R7989 Other specified abnormal findings of blood chemistry: Secondary | ICD-10-CM | POA: Diagnosis not present

## 2019-09-11 DIAGNOSIS — J1282 Pneumonia due to coronavirus disease 2019: Secondary | ICD-10-CM | POA: Diagnosis not present

## 2019-09-11 LAB — CBC WITH DIFFERENTIAL/PLATELET
Abs Immature Granulocytes: 0 10*3/uL (ref 0.00–0.07)
Basophils Absolute: 0 10*3/uL (ref 0.0–0.1)
Basophils Relative: 0 %
Eosinophils Absolute: 0 10*3/uL (ref 0.0–0.5)
Eosinophils Relative: 0 %
HCT: 36.3 % (ref 36.0–46.0)
Hemoglobin: 11.7 g/dL — ABNORMAL LOW (ref 12.0–15.0)
Lymphocytes Relative: 5 %
Lymphs Abs: 0.4 10*3/uL — ABNORMAL LOW (ref 0.7–4.0)
MCH: 29.2 pg (ref 26.0–34.0)
MCHC: 32.2 g/dL (ref 30.0–36.0)
MCV: 90.5 fL (ref 80.0–100.0)
Monocytes Absolute: 0.5 10*3/uL (ref 0.1–1.0)
Monocytes Relative: 6 %
Neutro Abs: 7.3 10*3/uL (ref 1.7–7.7)
Neutrophils Relative %: 89 %
Platelets: 291 10*3/uL (ref 150–400)
RBC: 4.01 MIL/uL (ref 3.87–5.11)
RDW: 14.2 % (ref 11.5–15.5)
WBC: 8.2 10*3/uL (ref 4.0–10.5)
nRBC: 0 % (ref 0.0–0.2)

## 2019-09-11 LAB — COMPREHENSIVE METABOLIC PANEL
ALT: 46 U/L — ABNORMAL HIGH (ref 0–44)
AST: 45 U/L — ABNORMAL HIGH (ref 15–41)
Albumin: 2.4 g/dL — ABNORMAL LOW (ref 3.5–5.0)
Alkaline Phosphatase: 77 U/L (ref 38–126)
Anion gap: 10 (ref 5–15)
BUN: 14 mg/dL (ref 6–20)
CO2: 23 mmol/L (ref 22–32)
Calcium: 7.9 mg/dL — ABNORMAL LOW (ref 8.9–10.3)
Chloride: 106 mmol/L (ref 98–111)
Creatinine, Ser: 0.58 mg/dL (ref 0.44–1.00)
GFR calc Af Amer: >60 mL/min (ref >60)
GFR calc non Af Amer: >60 mL/min (ref >60)
Glucose, Bld: 145 mg/dL — ABNORMAL HIGH (ref 70–99)
Potassium: 3.6 mmol/L (ref 3.5–5.1)
Sodium: 139 mmol/L (ref 135–145)
Total Bilirubin: 0.2 mg/dL — ABNORMAL LOW (ref 0.3–1.2)
Total Protein: 6 g/dL — ABNORMAL LOW (ref 6.5–8.1)

## 2019-09-11 LAB — C-REACTIVE PROTEIN: CRP: 1.9 mg/dL — ABNORMAL HIGH (ref <1.0)

## 2019-09-11 LAB — D-DIMER, QUANTITATIVE: D-Dimer, Quant: 0.74 ug/mL-FEU — ABNORMAL HIGH (ref 0.00–0.50)

## 2019-09-11 NOTE — Progress Notes (Signed)
PROGRESS NOTE    Teresa Orozco  ZOX:[id number] DOB: 1975/05/24 DOA: 09/08/2019 PCP: Patient, No Pcp Per   Chief Complaint  Patient presents with  . Fever    Brief Narrative:   Teresa Orozco is Teresa Orozco 44 y.o. female with no significant past medical history presenting with complaints of fevers and malaise.  Spanish interpreter services used.  Patient reports feeling sick for the past 2 weeks.  She is having fevers, malaise, and cough.  Denies having shortness of breath, chest pain, abdominal pain, diarrhea, or dysuria.  States she vomited after taking Tylenol at home but is not vomiting otherwise and not nauseous.  She has not been vaccinated against Covid.  She was septic on admission with severe lactic acidosis, which has now resolved.    Assessment & Plan:   Principal Problem:   Pneumonia due to COVID-19 virus Active Problems:   UTI (urinary tract infection)   Severe sepsis (HCC)   Hypokalemia   Goiter  Acute Hypoxic Respiratory Failure 2/2 COVID-19 viral multifocal pneumonia:  Unvaccinated  CXR with patchy airspace opacities in R mid and lower lung zone CT PE protocol with multifocal consolidation and scattered areas of ground glass pulm infiltrate Currently doing well on RA Continue steroids, remdesivir  Strict I/O, daily weights  Daily inflammatory labs OOB, prone as able, IS, flutter Potentially d/c home after she completes course of remdesivir inpatient  COVID-19 Labs  Recent Labs    09/08/19 2318 09/09/19 0158 09/10/19 0500 09/11/19 0730  DDIMER 1.80*  --  0.87* 0.74*  FERRITIN  --  305  --   --   LDH  --  124  --   --   CRP  --  2.9* 6.7* 1.9*    Lab Results  Component Value Date   SARSCOV2NAA POSITIVE (Martyna Thorns) 09/08/2019   Severe sepsis: In the setting of COVID-19 viral multifocal pneumonia and UTI.  Lactate above 11 at presentation. Procalcitonin < 0.1 UA with positive nitrites, small LE, few bacteria, 21-50 WBC concerning for UTI, abx given before urine  collected Urine cx no growth Blood cx no growth to date, continue to monitor   UTI: No leukocytosis on labs. UA with positive nitrite, small amount of leukocytes, 21-50 WBCs, and few bacteria.  She notes dysuria. -Continue ceftriaxone.  urine cx no growth, but obtained after abx.  Elevated D dimer: LE [place] negative, CTA 8/24 without PE  Mild hypokalemia  Hypomagnesemia: resolved  Transaminitis: Likely related to COVID-19 viral infection.  Patient denies history of alcohol use. 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  No wheezing or stridor on exam. -Check TSH and free T4 levels (wnl) -will need ENT follow up at time of discharge  DVT prophylaxis: lovenox Code Status: full  Family Communication: none at bedside Disposition:   Status is: Inpatient  Remains inpatient appropriate because:Inpatient level of care appropriate due to severity of illness   Dispo: The patient is from: Home              Anticipated d/c is to: Home              Anticipated d/c date is: > 3 days              Patient currently is not medically stable to d/c.  Consultants:   none  Procedures:  none  Antimicrobials:  Anti-infectives (From admission, onward)   Start     Dose/Rate Route Frequency Ordered Stop   09/10/19 1000  remdesivir 100 mg in sodium chloride 0.9 % 100 mL IVPB       "Followed by" Linked Group Details   100 mg 200 mL/hr over 30 Minutes Intravenous Daily 09/09/19 0452 09/14/19 0959   09/09/19 2200  cefTRIAXone (ROCEPHIN) 2 g in sodium chloride 0.9 % 100 mL IVPB        2 g 200 mL/hr over 30 Minutes Intravenous Every 24 hours 09/09/19 0452     09/09/19 0600  remdesivir 200 mg in sodium chloride 0.9% 250 mL IVPB       "Followed by" Linked Group Details   200 mg 580 mL/hr over 30 Minutes  Intravenous Once 09/09/19 0452 09/09/19 0710   09/08/19 2245  cefTRIAXone (ROCEPHIN) 2 g in sodium chloride 0.9 % 100 mL IVPB        2 g 200 mL/hr over 30 Minutes Intravenous  Once 09/08/19 2231 09/09/19 0056   09/08/19 2230  ceFEPIme (MAXIPIME) 2 g in sodium chloride 0.9 % 100 mL IVPB  Status:  Discontinued        2 g 200 mL/hr over 30 Minutes Intravenous  Once 09/08/19 2227 09/08/19 2229   09/08/19 2230  cefTRIAXone (ROCEPHIN) 1 g in sodium chloride 0.9 % 100 mL IVPB  Status:  Discontinued        1 g 200 mL/hr over 30 Minutes Intravenous  Once 09/08/19 2229 09/08/19 2231   09/08/19 2230  vancomycin (VANCOREADY) IVPB 1750 mg/350 mL        1,750 mg 175 mL/hr over 120 Minutes Intravenous  Once 09/08/19 2229 09/09/19 0316         Subjective: No new complaints today Wants to complete remdesivir inpatient   Objective: Vitals:   09/11/19 0100 09/11/19 0614 09/11/19 0632 09/11/19 1024  BP: 95/67 97/65 104/69   Pulse: 79 80  79  Resp: 20 (!) 21    Temp: (!) 97.5 F (36.4 C) 97.7 F (36.5 C) 97.6 F (36.4 C) 98 F (36.7 C)  TempSrc: Oral Oral Oral   SpO2: 94% 97%    Weight: 74.6 kg     Height:        Intake/Output Summary (Last 24 hours) at 09/11/2019 1558 Last data filed at 09/11/2019 0615 Gross per 24 hour  Intake 540.06 ml  Output --  Net 540.06 ml   Filed Weights   09/09/19 0000 09/10/19 1947 09/11/19 0100  Weight: 80.3 kg 74.8 kg 74.6 kg    Examination:  General: No acute distress. Cardiovascular: Heart sounds show Xylan Sheils regular rate, and rhythm.  Lungs: Clear to auscultation bilaterally Abdomen: Soft, nontender, nondistended  Neurological: Alert and oriented 3. Moves all extremities 4 . Cranial nerves II through XII grossly intact. Skin: Warm and dry. No rashes or lesions. Extremities: No clubbing or cyanosis. No edema.   Data Reviewed: I have personally reviewed following labs and imaging studies  CBC: Recent Labs  Lab 09/08/19 2208 09/10/19 0500  09/11/19 0730  WBC 6.6 4.6 8.2  NEUTROABS 5.6 3.2 7.3  HGB 14.5 12.7 11.7*  HCT 44.0 39.3 36.3  MCV 90.0 92.3 90.5  PLT 203 274 291    Basic Metabolic Panel: Recent Labs  Lab 09/08/19 2208 09/09/19 0527 09/10/19 0500 09/11/19 0730  NA 132*  --  139 139  K 3.2*  --  4.1 3.6  CL 98  --  107 106  CO2 21*  --  22 23  GLUCOSE 117*  --  143* 145*  BUN 20  --  14 14  CREATININE 1.07*  --  0.62 0.58  CALCIUM 8.3*  --  8.0* 7.9*  MG  --  1.6* 1.9  --     GFR: Estimated Creatinine Clearance: 92.7 mL/min (by C-G formula based on SCr of 0.58 mg/dL).  Liver Function Tests: Recent Labs  Lab 09/08/19 2208 09/10/19 0500 09/11/19 0730  AST 100* 46* 45*  ALT 95* 52* 46*  ALKPHOS 109 86 77  BILITOT 0.7 0.4 0.2*  PROT 7.3 6.2* 6.0*  ALBUMIN 3.3* 2.5* 2.4*    CBG: Recent Labs  Lab 09/09/19 1500  GLUCAP 141*     Recent Results (from the past 240 hour(s))  Blood culture (routine x 2)     Status: None (Preliminary result)   Collection Time: 09/08/19 10:08 PM   Specimen: BLOOD  Result Value Ref Range Status   Specimen Description BLOOD RIGHT ANTECUBITAL  Final   Special Requests   Final    BOTTLES DRAWN AEROBIC AND ANAEROBIC Blood Culture adequate volume   Culture   Final    NO GROWTH 3 DAYS Performed at [facility], [address], [place], [place] [address]    Report Status PENDING  Incomplete  SARS Coronavirus 2 by RT PCR (hospital order, performed in [facility] hospital lab) Nasopharyngeal Nasopharyngeal Swab     Status: Abnormal   Collection Time: 09/08/19 10:18 PM   Specimen: Nasopharyngeal Swab  Result Value Ref Range Status   SARS Coronavirus 2 POSITIVE ([place]) NEGATIVE Final    Comment: RESULT CALLED TO, READ BACK BY AND VERIFIED WITH: RN M COCHRANE @0027  09/09/19 BY S GEZAHEGN (NOTE) SARS-CoV-2 target nucleic acids are DETECTED  SARS-CoV-2 RNA is generally detectable in upper respiratory specimens  during the acute phase of infection.  Positive results  are indicative  of the presence of the identified virus, but do not rule out bacterial infection or co-infection with other pathogens not detected by the test.  Clinical correlation with patient history and  other diagnostic information is necessary to determine patient infection status.  The expected result is negative.  Fact Sheet for Patients:   09/11/19   Fact Sheet for Healthcare Providers:   BoilerBrush.com.cy    This test is not yet approved or cleared by the https://pope.com/ FDA and  has been authorized for detection and/or diagnosis of SARS-CoV-2 by FDA under an Emergency Use Authorization (EUA).  This EUA will remain in effect (meaning  this test can be used) for the duration of  the COVID-19 declaration under Section 564(b)(1) of the Act, 21 U.S.C. section 360-bbb-3(b)(1), unless the authorization is terminated or revoked sooner.  Performed at [facility], [address], [place], [place]   Blood culture (routine x 2)     Status: None (Preliminary result)   Collection Time: 09/08/19 10:19 PM   Specimen: BLOOD RIGHT HAND  Result Value Ref Range Status   Specimen Description BLOOD RIGHT HAND  Final   Special Requests   Final    BOTTLES DRAWN AEROBIC ONLY Blood Culture results may not be optimal due to an inadequate volume of blood received in culture bottles   Culture   Final    NO GROWTH 3 DAYS Performed at [facility], [address], [place], [place]    Report Status PENDING  Incomplete  Urine  culture     Status: None   Collection Time: 09/09/19  2:43 AM   Specimen: In/Out Cath Urine  Result Value Ref Range Status   Specimen Description IN/OUT CATH URINE  Final   Special Requests NONE  Final   Culture   Final    NO GROWTH Performed at [facility], [address], [place], [place] [address]    Report Status 09/10/2019 FINAL  Final         Radiology  Studies: VAS [place] LOWER EXTREMITY VENOUS (DVT)  Result Date: 09/11/2019  Lower Venous DVTStudy Indications: Covid ++ elevated ddimer.  Comparison Study: no prior Performing Technologist: Blanch Media RVS  Examination Guidelines: Awa Bachicha complete evaluation includes B-mode imaging, spectral Doppler, color Doppler, and power Doppler as needed of all accessible portions of each vessel. Bilateral testing is considered an integral part of Maple Odaniel complete examination. Limited examinations for reoccurring indications may be performed as noted. The reflux portion of the exam is performed with the patient in reverse Trendelenburg.  +---------+---------------+---------+-----------+----------+--------------+ RIGHT    CompressibilityPhasicitySpontaneityPropertiesThrombus Aging +---------+---------------+---------+-----------+----------+--------------+ CFV      Full           Yes      Yes                                 +---------+---------------+---------+-----------+----------+--------------+ SFJ      Full                                                        +---------+---------------+---------+-----------+----------+--------------+ FV Prox  Full                                                        +---------+---------------+---------+-----------+----------+--------------+ FV Mid   Full                                                        +---------+---------------+---------+-----------+----------+--------------+ FV DistalFull                                                        +---------+---------------+---------+-----------+----------+--------------+ PFV      Full                                                        +---------+---------------+---------+-----------+----------+--------------+ POP      Full           Yes      Yes                                 +---------+---------------+---------+-----------+----------+--------------+  PTV      Full                                                         +---------+---------------+---------+-----------+----------+--------------+ PERO     Full                                                        +---------+---------------+---------+-----------+----------+--------------+   +---------+---------------+---------+-----------+----------+--------------+ LEFT     CompressibilityPhasicitySpontaneityPropertiesThrombus Aging +---------+---------------+---------+-----------+----------+--------------+ CFV      Full           Yes      Yes                                 +---------+---------------+---------+-----------+----------+--------------+ SFJ      Full                                                        +---------+---------------+---------+-----------+----------+--------------+ FV Prox  Full                                                        +---------+---------------+---------+-----------+----------+--------------+ FV Mid   Full                                                        +---------+---------------+---------+-----------+----------+--------------+ FV DistalFull                                                        +---------+---------------+---------+-----------+----------+--------------+ PFV      Full                                                        +---------+---------------+---------+-----------+----------+--------------+ POP      Full           Yes      Yes                                 +---------+---------------+---------+-----------+----------+--------------+ PTV      Full                                                        +---------+---------------+---------+-----------+----------+--------------+  PERO     Full                                                        +---------+---------------+---------+-----------+----------+--------------+     Summary: BILATERAL: - No evidence of deep vein thrombosis seen in the lower  extremities, bilaterally. - No evidence of superficial venous thrombosis in the lower extremities, bilaterally. -   *See table(s) above for measurements and observations. Electronically signed by Sherald Hesshristopher Clark MD on 09/11/2019 at 3:19:47 PM.    Final         Scheduled Meds: . vitamin C  500 mg Oral Daily  . cholecalciferol  1,000 Units Oral Daily  . dexamethasone  6 mg Oral Daily  . enoxaparin (LOVENOX) injection  40 mg Subcutaneous Q0600  . zinc sulfate  220 mg Oral Daily   Continuous Infusions: . cefTRIAXone (ROCEPHIN)  IV 2 g (09/10/19 2213)  . remdesivir 100 mg in NS 100 mL 100 mg (09/11/19 1044)     LOS: 2 days    Time spent: over 30 min    Lacretia Nicksaldwell Powell, MD [facility]   To contact the attending provider between 7A-7P or the covering provider during after hours 7P-7A, please log into the web site [email or web address] and access using universal [place] password for that web site. If you do not have the password, please call the hospital operator.  09/11/2019, 3:58 PM

## 2019-09-11 NOTE — Progress Notes (Signed)
Lower extremity venous has been completed.   Preliminary results in CV Proc.   Blanch Media 09/11/2019 10:28 AM

## 2019-09-12 DIAGNOSIS — U071 COVID-19: Secondary | ICD-10-CM | POA: Diagnosis not present

## 2019-09-12 DIAGNOSIS — J1282 Pneumonia due to coronavirus disease 2019: Secondary | ICD-10-CM | POA: Diagnosis not present

## 2019-09-12 LAB — CBC WITH DIFFERENTIAL/PLATELET
Abs Immature Granulocytes: 0.2 10*3/uL — ABNORMAL HIGH (ref 0.00–0.07)
Band Neutrophils: 3 %
Basophils Absolute: 0 10*3/uL (ref 0.0–0.1)
Basophils Relative: 0 %
Eosinophils Absolute: 0 10*3/uL (ref 0.0–0.5)
Eosinophils Relative: 0 %
HCT: 37.3 % (ref 36.0–46.0)
Hemoglobin: 12.2 g/dL (ref 12.0–15.0)
Lymphocytes Relative: 9 %
Lymphs Abs: 0.7 10*3/uL (ref 0.7–4.0)
MCH: 29.9 pg (ref 26.0–34.0)
MCHC: 32.7 g/dL (ref 30.0–36.0)
MCV: 91.4 fL (ref 80.0–100.0)
Monocytes Absolute: 0.3 10*3/uL (ref 0.1–1.0)
Monocytes Relative: 4 %
Myelocytes: 2 %
Neutro Abs: 6.9 10*3/uL (ref 1.7–7.7)
Neutrophils Relative %: 82 %
Platelets: 319 10*3/uL (ref 150–400)
RBC: 4.08 MIL/uL (ref 3.87–5.11)
RDW: 13.9 % (ref 11.5–15.5)
WBC Morphology: 25
WBC: 8.1 10*3/uL (ref 4.0–10.5)
nRBC: 0 % (ref 0.0–0.2)

## 2019-09-12 LAB — COMPREHENSIVE METABOLIC PANEL
ALT: 93 U/L — ABNORMAL HIGH (ref 0–44)
AST: 86 U/L — ABNORMAL HIGH (ref 15–41)
Albumin: 2.5 g/dL — ABNORMAL LOW (ref 3.5–5.0)
Alkaline Phosphatase: 79 U/L (ref 38–126)
Anion gap: 9 (ref 5–15)
BUN: 15 mg/dL (ref 6–20)
CO2: 26 mmol/L (ref 22–32)
Calcium: 7.9 mg/dL — ABNORMAL LOW (ref 8.9–10.3)
Chloride: 108 mmol/L (ref 98–111)
Creatinine, Ser: 0.71 mg/dL (ref 0.44–1.00)
GFR calc Af Amer: >60 mL/min (ref >60)
GFR calc non Af Amer: >60 mL/min (ref >60)
Glucose, Bld: 119 mg/dL — ABNORMAL HIGH (ref 70–99)
Potassium: 3.6 mmol/L (ref 3.5–5.1)
Sodium: 143 mmol/L (ref 135–145)
Total Bilirubin: 0.6 mg/dL (ref 0.3–1.2)
Total Protein: 5.9 g/dL — ABNORMAL LOW (ref 6.5–8.1)

## 2019-09-12 LAB — D-DIMER, QUANTITATIVE: D-Dimer, Quant: 0.63 ug/mL-FEU — ABNORMAL HIGH (ref 0.00–0.50)

## 2019-09-12 LAB — C-REACTIVE PROTEIN: CRP: 0.9 mg/dL (ref <1.0)

## 2019-09-12 MED ORDER — SODIUM CHLORIDE 0.9 % IV SOLN
INTRAVENOUS | Status: DC | PRN
  Administered 2019-09-12: 250 mL via INTRAVENOUS

## 2019-09-12 NOTE — Progress Notes (Signed)
PT Cancellation Note  Patient Details Name: Teresa Orozco MRN: [id number] DOB: [birth year]   Cancelled Treatment:    Reason Eval/Treat Not Completed: Other (comment).  Per nsg pt is up without physical assist walking and is not in need of direct PT help.  Re-screen tomorrow to see if pt is having changes in mobility.   Ivar Drape 09/12/2019, 12:01 PM   Samul Dada, PT MS Acute Rehab Dept. Number: [facility] [id number] and [facility] [phone]

## 2019-09-12 NOTE — Progress Notes (Signed)
PROGRESS NOTE    Teresa Orozco  ZOX:[id number] DOB: Sep 10, 1975 DOA: 09/08/2019 PCP: Patient, No Pcp Per   Chief Complaint  Patient presents with  . Fever    Brief Narrative:   Teresa Orozco is Teresa Orozco 44 y.o. female with no significant past medical history presenting with complaints of fevers and malaise.  Spanish interpreter services used.  Patient reports feeling sick for the past 2 weeks.  She is having fevers, malaise, and cough.  Denies having shortness of breath, chest pain, abdominal pain, diarrhea, or dysuria.  States she vomited after taking Tylenol at home but is not vomiting otherwise and not nauseous.  She has not been vaccinated against Covid.  She was septic on admission with severe lactic acidosis, which has now resolved.    Assessment & Plan:   Principal Problem:   Pneumonia due to COVID-19 virus Active Problems:   UTI (urinary tract infection)   Severe sepsis (HCC)   Hypokalemia   Goiter  Acute Hypoxic Respiratory Failure 2/2 COVID-19 viral multifocal pneumonia:  Unvaccinated  CXR with patchy airspace opacities in R mid and lower lung zone CT PE protocol with multifocal consolidation and scattered areas of ground glass pulm infiltrate Currently doing well on RA Continue steroids, remdesivir  Strict I/O, daily weights  Daily inflammatory labs OOB, prone as able, IS, flutter Potentially d/c home after she completes course of remdesivir inpatient on 8/28  COVID-19 Labs  Recent Labs    09/10/19 0500 09/11/19 0730 09/12/19 0613  DDIMER 0.87* 0.74* 0.63*  CRP 6.7* 1.9* 0.9    Lab Results  Component Value Date   SARSCOV2NAA POSITIVE (Amire Gossen) 09/08/2019   Severe sepsis: In the setting of COVID-19 viral multifocal pneumonia and UTI.  Lactate above 11 at presentation. Procalcitonin < 0.1 UA with positive nitrites, small LE, few bacteria, 21-50 WBC concerning for UTI, abx given before urine collected Urine cx no growth Blood cx no growth to date, continue to monitor    UTI: No leukocytosis on labs. UA with positive nitrite, small amount of leukocytes, 21-50 WBCs, and few bacteria.  She notes dysuria. -Continue ceftriaxone (will treat x5 days).  urine cx no growth, but obtained after abx.  Elevated D dimer: LE [place] negative, CTA 8/24 without PE  Mild hypokalemia  Hypomagnesemia: resolved  Transaminitis: Likely related to COVID-19 viral infection, fluctuating.  Patient denies history of alcohol use. 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  No wheezing or stridor on exam. -Check TSH and free T4 levels (wnl) -will need ENT follow up at time of discharge  DVT prophylaxis: lovenox Code Status: full  Family Communication: none at bedside Disposition:   Status is: Inpatient  Remains inpatient appropriate because:Inpatient level of care appropriate due to severity of illness   Dispo: The patient is from: Home              Anticipated d/c is to: Home              Anticipated d/c date is: > 3 days              Patient currently is not medically stable to d/c.  Consultants:   none  Procedures:   none  Antimicrobials:  Anti-infectives (From admission, onward)   Start     Dose/Rate Route Frequency Ordered Stop   09/10/19 1000  remdesivir 100 mg in sodium chloride 0.9 % 100 mL IVPB       "Followed by" Linked Group Details   100 mg 200 mL/hr over 30 Minutes Intravenous Daily 09/09/19 0452 09/14/19 0959   09/09/19 2200  cefTRIAXone (ROCEPHIN) 2 g in sodium chloride 0.9 % 100 mL IVPB        2 g 200 mL/hr over 30 Minutes Intravenous Every 24 hours 09/09/19 0452     09/09/19 0600  remdesivir 200 mg in sodium chloride 0.9% 250 mL IVPB       "Followed by" Linked Group Details   200 mg 580 mL/hr over 30 Minutes Intravenous Once 09/09/19 0452 09/09/19 0710    09/08/19 2245  cefTRIAXone (ROCEPHIN) 2 g in sodium chloride 0.9 % 100 mL IVPB        2 g 200 mL/hr over 30 Minutes Intravenous  Once 09/08/19 2231 09/09/19 0056   09/08/19 2230  ceFEPIme (MAXIPIME) 2 g in sodium chloride 0.9 % 100 mL IVPB  Status:  Discontinued        2 g 200 mL/hr over 30 Minutes Intravenous  Once 09/08/19 2227 09/08/19 2229   09/08/19 2230  cefTRIAXone (ROCEPHIN) 1 g in sodium chloride 0.9 % 100 mL IVPB  Status:  Discontinued        1 g 200 mL/hr over 30 Minutes Intravenous  Once 09/08/19 2229 09/08/19 2231   09/08/19 2230  vancomycin (VANCOREADY) IVPB 1750 mg/350 mL        1,750 mg 175 mL/hr over 120 Minutes Intravenous  Once 09/08/19 2229 09/09/19 0316         Subjective: C/o generalized weakness Breathing is stable   Objective: Vitals:   09/11/19 1024 09/11/19 2000 09/12/19 0500 09/12/19 0851  BP:   103/81   Pulse: 79 91 72 82  Resp:  20    Temp: 98 F (36.7 C) 98.1 F (36.7 C) 97.8 F (36.6 C) 98.3 F (36.8 C)  TempSrc:  Oral Oral Oral  SpO2:  94% 93% 98%  Weight:   73.9 kg   Height:        Intake/Output Summary (Last 24 hours) at 09/12/2019 1337 Last data filed at 09/11/2019 1753 Gross per 24 hour  Intake 220 ml  Output --  Net 220 ml   Filed Weights   09/10/19 1947 09/11/19 0100 09/12/19 0500  Weight: 74.8 kg 74.6 kg 73.9 kg    Examination:  General: No acute distress. Cardiovascular: RRR Lungs: unlabored, no increased WOB Abdomen: Soft, nontender, nondistended  Neurological: Alert and oriented 3. Moves all extremities 4 with equal strength. Cranial nerves II through XII grossly intact. Skin: Warm and dry. No rashes or lesions. Extremities: No clubbing or cyanosis. No edema.  Data Reviewed: I have personally reviewed following labs and imaging studies  CBC: Recent Labs  Lab 09/08/19 2208 09/10/19 0500 09/11/19 0730 09/12/19 0613  WBC 6.6 4.6 8.2 8.1  NEUTROABS 5.6 3.2 7.3 6.9  HGB 14.5 12.7 11.7* 12.2  HCT 44.0 39.3  36.3 37.3  MCV 90.0 92.3 90.5 91.4  PLT 203 274 291 319    Basic Metabolic Panel: Recent Labs  Lab 09/08/19 2208 09/09/19 0527 09/10/19 0500 09/11/19 0730 09/12/19 0613  NA 132*  --  139 139 143  K 3.2*  --  4.1 3.6 3.6  CL 98  --  107 106 108  CO2 21*  --  22 23 26   GLUCOSE 117*  --  143* 145* 119*  BUN 20  --  14 14 15   CREATININE 1.07*  --  0.62 0.58 0.71  CALCIUM 8.3*  --  8.0* 7.9* 7.9*  MG  --  1.6* 1.9  --   --     GFR: Estimated Creatinine Clearance: 92.2 mL/min (by C-G formula based on SCr of 0.71 mg/dL).  Liver Function Tests: Recent Labs  Lab 09/08/19 2208 09/10/19 0500 09/11/19 0730 09/12/19 0613  AST 100* 46* 45* 86*  ALT 95* 52* 46* 93*  ALKPHOS 109 86 77 79  BILITOT 0.7 0.4 0.2* 0.6  PROT 7.3 6.2* 6.0* 5.9*  ALBUMIN 3.3* 2.5* 2.4* 2.5*    CBG: Recent Labs  Lab 09/09/19 1500  GLUCAP 141*     Recent Results (from the past 240 hour(s))  Blood culture (routine x 2)     Status: None (Preliminary result)   Collection Time: 09/08/19 10:08 PM   Specimen: BLOOD  Result Value Ref Range Status   Specimen Description BLOOD RIGHT ANTECUBITAL  Final   Special Requests   Final    BOTTLES DRAWN AEROBIC AND ANAEROBIC Blood Culture adequate volume   Culture   Final    NO GROWTH 4 DAYS Performed at [facility], [address] [place], [place]    Report Status PENDING  Incomplete  SARS Coronavirus 2 by RT PCR (hospital order, performed in [facility] hospital lab) Nasopharyngeal Nasopharyngeal Swab     Status: Abnormal   Collection Time: 09/08/19 10:18 PM   Specimen: Nasopharyngeal Swab  Result Value Ref Range Status   SARS Coronavirus 2 POSITIVE ([place]) NEGATIVE Final    Comment: RESULT CALLED TO, READ BACK BY AND VERIFIED WITH: RN M COCHRANE @0027  09/09/19 BY S GEZAHEGN (NOTE) SARS-CoV-2 target nucleic acids are DETECTED  SARS-CoV-2 RNA is generally detectable in upper respiratory specimens  during the acute phase of infection.   Positive results are indicative  of the presence of the identified virus, but do not rule out bacterial infection or co-infection with other pathogens not detected by the test.  Clinical correlation with patient history and  other diagnostic information is necessary to determine patient infection status.  The expected result is negative.  Fact Sheet for Patients:     Fact Sheet for Healthcare Providers:   09/11/19    This test is not yet approved or cleared by the [employer] FDA and  has been authorized for detection and/or diagnosis of SARS-CoV-2 by FDA under an Emergency Use Authorization (EUA).  This EUA will remain in effect (meaning  this test can be used) for the duration of  the COVID-19 declaration under Section 564(b)(1) of the Act, 21 U.S.C. section 360-bbb-3(b)(1), unless the authorization is terminated or revoked sooner.  Performed at [facility], [address], [place], [address] [place]   Blood culture (routine x 2)     Status: None (Preliminary result)   Collection Time: 09/08/19 10:19 PM   Specimen: BLOOD RIGHT HAND  Result Value Ref Range Status   Specimen Description BLOOD RIGHT HAND  Final   Special Requests   Final    BOTTLES DRAWN AEROBIC ONLY Blood Culture results may not be optimal due to an inadequate volume of blood received in culture bottles   Culture   Final    NO GROWTH 4 DAYS Performed at [facility], [address], [place], [address] [place]    Report Status PENDING  Incomplete  Urine culture     Status: None   Collection  Time: 09/09/19  2:43 AM   Specimen: In/Out Cath Urine  Result Value Ref Range Status   Specimen Description IN/OUT CATH URINE  Final   Special Requests NONE  Final   Culture   Final    NO GROWTH Performed at [facility], [address] [place], [place] [address]    Report Status 09/10/2019 FINAL  Final          Radiology Studies: VAS [place] LOWER EXTREMITY VENOUS (DVT)  Result Date: 09/11/2019  Lower Venous DVTStudy Indications: Covid ++ elevated ddimer.  Comparison Study: no prior Performing Technologist: Blanch Media RVS  Examination Guidelines: Shanetra Blumenstock complete evaluation includes B-mode imaging, spectral Doppler, color Doppler, and power Doppler as needed of all accessible portions of each vessel. Bilateral testing is considered an integral part of Felipe Paluch complete examination. Limited examinations for reoccurring indications may be performed as noted. The reflux portion of the exam is performed with the patient in reverse Trendelenburg.  +---------+---------------+---------+-----------+----------+--------------+ RIGHT    CompressibilityPhasicitySpontaneityPropertiesThrombus Aging +---------+---------------+---------+-----------+----------+--------------+ CFV      Full           Yes      Yes                                 +---------+---------------+---------+-----------+----------+--------------+ SFJ      Full                                                        +---------+---------------+---------+-----------+----------+--------------+ FV Prox  Full                                                        +---------+---------------+---------+-----------+----------+--------------+ FV Mid   Full                                                        +---------+---------------+---------+-----------+----------+--------------+ FV DistalFull                                                        +---------+---------------+---------+-----------+----------+--------------+ PFV      Full                                                        +---------+---------------+---------+-----------+----------+--------------+ POP      Full           Yes      Yes                                 +---------+---------------+---------+-----------+----------+--------------+ PTV  Full                                                        +---------+---------------+---------+-----------+----------+--------------+ PERO     Full                                                        +---------+---------------+---------+-----------+----------+--------------+   +---------+---------------+---------+-----------+----------+--------------+ LEFT     CompressibilityPhasicitySpontaneityPropertiesThrombus Aging +---------+---------------+---------+-----------+----------+--------------+ CFV      Full           Yes      Yes                                 +---------+---------------+---------+-----------+----------+--------------+ SFJ      Full                                                        +---------+---------------+---------+-----------+----------+--------------+ FV Prox  Full                                                        +---------+---------------+---------+-----------+----------+--------------+ FV Mid   Full                                                        +---------+---------------+---------+-----------+----------+--------------+ FV DistalFull                                                        +---------+---------------+---------+-----------+----------+--------------+ PFV      Full                                                        +---------+---------------+---------+-----------+----------+--------------+ POP      Full           Yes      Yes                                 +---------+---------------+---------+-----------+----------+--------------+ PTV      Full                                                        +---------+---------------+---------+-----------+----------+--------------+  PERO     Full                                                        +---------+---------------+---------+-----------+----------+--------------+     Summary: BILATERAL: - No evidence of deep vein  thrombosis seen in the lower extremities, bilaterally. - No evidence of superficial venous thrombosis in the lower extremities, bilaterally. -   *See table(s) above for measurements and observations. Electronically signed by Sherald Hess MD on 09/11/2019 at 3:19:47 PM.    Final         Scheduled Meds: . vitamin C  500 mg Oral Daily  . cholecalciferol  1,000 Units Oral Daily  . dexamethasone  6 mg Oral Daily  . enoxaparin (LOVENOX) injection  40 mg Subcutaneous Q0600  . zinc sulfate  220 mg Oral Daily   Continuous Infusions: . cefTRIAXone (ROCEPHIN)  IV 2 g (09/11/19 2156)  . remdesivir 100 mg in NS 100 mL 100 mg (09/12/19 0946)     LOS: 3 days    Time spent: over 30 min    Lacretia Nicks, MD [facility]   To contact the attending provider between 7A-7P or the covering provider during after hours 7P-7A, please log into the web site [email or web address] and access using universal [place] password for that web site. If you do not have the password, please call the hospital operator.  09/12/2019, 1:37 PM

## 2019-09-13 DIAGNOSIS — U071 COVID-19: Secondary | ICD-10-CM | POA: Diagnosis not present

## 2019-09-13 DIAGNOSIS — J1282 Pneumonia due to coronavirus disease 2019: Secondary | ICD-10-CM | POA: Diagnosis not present

## 2019-09-13 LAB — COMPREHENSIVE METABOLIC PANEL
ALT: 91 U/L — ABNORMAL HIGH (ref 0–44)
AST: 58 U/L — ABNORMAL HIGH (ref 15–41)
Albumin: 2.4 g/dL — ABNORMAL LOW (ref 3.5–5.0)
Alkaline Phosphatase: 80 U/L (ref 38–126)
Anion gap: 10 (ref 5–15)
BUN: 14 mg/dL (ref 6–20)
CO2: 24 mmol/L (ref 22–32)
Calcium: 7.8 mg/dL — ABNORMAL LOW (ref 8.9–10.3)
Chloride: 106 mmol/L (ref 98–111)
Creatinine, Ser: 0.58 mg/dL (ref 0.44–1.00)
GFR calc Af Amer: >60 mL/min (ref >60)
GFR calc non Af Amer: >60 mL/min (ref >60)
Glucose, Bld: 118 mg/dL — ABNORMAL HIGH (ref 70–99)
Potassium: 3.7 mmol/L (ref 3.5–5.1)
Sodium: 140 mmol/L (ref 135–145)
Total Bilirubin: 0.3 mg/dL (ref 0.3–1.2)
Total Protein: 5.8 g/dL — ABNORMAL LOW (ref 6.5–8.1)

## 2019-09-13 LAB — CBC WITH DIFFERENTIAL/PLATELET
Abs Immature Granulocytes: 0.35 10*3/uL — ABNORMAL HIGH (ref 0.00–0.07)
Basophils Absolute: 0 10*3/uL (ref 0.0–0.1)
Basophils Relative: 1 %
Eosinophils Absolute: 0 10*3/uL (ref 0.0–0.5)
Eosinophils Relative: 0 %
HCT: 35.6 % — ABNORMAL LOW (ref 36.0–46.0)
Hemoglobin: 11.7 g/dL — ABNORMAL LOW (ref 12.0–15.0)
Immature Granulocytes: 4 %
Lymphocytes Relative: 15 %
Lymphs Abs: 1.2 10*3/uL (ref 0.7–4.0)
MCH: 29.9 pg (ref 26.0–34.0)
MCHC: 32.9 g/dL (ref 30.0–36.0)
MCV: 91 fL (ref 80.0–100.0)
Monocytes Absolute: 0.9 10*3/uL (ref 0.1–1.0)
Monocytes Relative: 11 %
Neutro Abs: 5.7 10*3/uL (ref 1.7–7.7)
Neutrophils Relative %: 69 %
Platelets: 352 10*3/uL (ref 150–400)
RBC: 3.91 MIL/uL (ref 3.87–5.11)
RDW: 13.9 % (ref 11.5–15.5)
WBC: 8.2 10*3/uL (ref 4.0–10.5)
nRBC: 0 % (ref 0.0–0.2)

## 2019-09-13 LAB — D-DIMER, QUANTITATIVE: D-Dimer, Quant: 0.76 ug/mL-FEU — ABNORMAL HIGH (ref 0.00–0.50)

## 2019-09-13 LAB — CULTURE, BLOOD (ROUTINE X 2)
Culture: NO GROWTH
Culture: NO GROWTH
Special Requests: ADEQUATE

## 2019-09-13 LAB — C-REACTIVE PROTEIN: CRP: 1.4 mg/dL — ABNORMAL HIGH (ref <1.0)

## 2019-09-13 MED ORDER — DEXAMETHASONE 6 MG PO TABS: 6.0000 mg | ORAL_TABLET | Freq: Every day | ORAL | 0 refills | Status: AC

## 2019-09-13 NOTE — Discharge Instructions (Signed)
Quarantine until 09/29/2019.  Please call your PCP if you have questions or concerns about quarantine.  COVID-19 COVID-19 El COVID-19 es una infeccin respiratoria causada por un virus llamado coronavirus tipo 2 causante del sndrome respiratorio agudo grave (SARS-CoV-2). La enfermedad tambin se conoce como enfermedad por coronavirus o nuevo coronavirus. En algunas personas, el virus puede no ocasionar sntomas. En otras, puede producir una infeccin grave. La infeccin puede empeorar rpidamente y causar complicaciones, como:  Neumona o infeccin en los pulmones.  Sndrome de dificultad respiratoria aguda o SDRA. Es una afeccin que se caracteriza por la acumulacin de lquido en los pulmones, que impide que los pulmones se llenen de aire y pasen oxgeno Marissa Lowrey Risk manager.  Insuficiencia respiratoria aguda. Es una afeccin que se caracteriza porque no pasa suficiente oxgeno de los pulmones al cuerpo o porque el dixido de carbono no pasa de los pulmones hacia afuera del cuerpo.  Sepsis o choque sptico. Se trata de una reaccin grave del cuerpo ante una infeccin.  Problemas de coagulacin.  Infecciones secundarias debido Alston Berrie bacterias u hongos.  Falla de rganos. Ocurre cuando los rganos del cuerpo dejan de funcionar. El virus que causa el COVID-19 es contagioso. Esto significa que puede transmitirse de [place] persona Amayah Staheli otra Brysin Towery travs de las gotitas de saliva de la tos y de los estornudos (secreciones respiratorias). Cules son las causas? Esta enfermedad es causada por un virus. Usted puede contagiarse con este virus:  Al inspirar las gotitas de una persona infectada. Las BJ's pueden diseminarse cuando una persona respira, habla, canta, tose o estornuda.  Al tocar algo, como una mesa o el picaportes de [employer], que estuvo expuesto al virus (contaminado) y luego tocarse la boca, nariz o los ojos. Qu incrementa el riesgo? Riesgo de infeccin Es ms probable que se infecte con este virus  si:  Se encuentra Jeymi Hepp Social worker Gabryela Kimbrell 6 pies (2 metros) de Medical laboratory scientific officer con COVID-19.  Cuida o vive con una persona infectada con COVID-19.  Pasa tiempo en espacios interiores repletos de gente o vive en viviendas compartidas. Riesgo de enfermedad grave Es ms probable que se enferme gravemente por el virus si:  Tiene 50aos o ms. Cuanto mayor sea su edad, mayor ser el riesgo de tener una enfermedad grave.  Vive en un hogar de ancianos o centro de atencin Dorean Daniello Air cabin crew.  Tiene cncer.  Tiene una enfermedad prolongada (crnica), como las siguientes: ? Enfermedad pulmonar crnica, que incluye la enfermedad pulmonar obstructiva crnica o asma. ? Una enfermedad crnica que disminuye la capacidad del cuerpo para combatir las infecciones (immunocomprometido). ? Enfermedad cardaca, que incluye insuficiencia cardaca, una afeccin que se caracteriza porque las arterias que llegan al corazn se Engineer, technical sales u obstruyen (arteriopata coronaria) o una enfermedad que provoca que el msculo cardaco se engrose, se debilite o endurezca (miocardiopata). ? Diabetes. ? Enfermedad renal crnica. ? Anemia drepanoctica, una enfermedad que se caracteriza porque los glbulos rojos tienen una forma anormal de "hoz". ? Enfermedad heptica.  Es obeso. Cules son los signos o sntomas? Los sntomas de esta afeccin pueden ser de leves Ericson Nafziger graves. Los sntomas pueden aparecer en el trmino de [address] [employer] [address] despus de haber estado expuesto al virus. Incluyen los siguientes:  Fiebre o escalofros.  Tos.  Dificultad para respirar.  Dolores de [place], dolores en el cuerpo o dolores musculares.  Secrecin o congestin nasal.  Dolor de garganta.  Nueva prdida del sentido del gusto o del olfato. Algunas personas tambin [employer]  problemas estomacales, como nuseas, vmitos o diarrea. Es posible que otras personas no tengan sntomas de COVID-19. Cmo se diagnostica? Esta afeccin se puede  diagnosticar en funcin de lo siguiente:  Sus signos y sntomas, especialmente si: ? Vive en una zona donde hay un brote de COVID-19. ? Viaj recientemente Jessiah Wojnar una zona donde el virus es frecuente. ? Cuida o vive con Neomia Dear persona Marlette Curvin quien se le diagnostic COVID-19. ? Odelia Gage expuesto Deloria Brassfield una persona Zyona Pettaway la que se le diagnostic COVID-19.  Un examen fsico.  Anlisis de laboratorio que pueden incluir: ? Tomar una muestra de lquido de la parte posterior de la nariz y la garganta (lquido nasofarngeo), la nariz o la garganta, con un hisopo. ? Una muestra de mucosidad de los pulmones (esputo). ? Anlisis de [place].  Los estudios de diagnstico por imgenes pueden incluir radiografas, exploracin por tomografa computarizada (TC) o ecografa. Cmo se trata? En este momento, no hay ningn medicamento para tratar el COVID-19. Los medicamentos para tratar otras enfermedades se usan Roselee Tayloe modo de ensayo para comprobar si son eficaces contra el COVID-19. El mdico le informar sobre las maneras de tratar los sntomas. En la [employer], la infeccin es leve y puede controlarse en el hogar con reposo, lquidos y medicamentos de Kim. El tratamiento para una infeccin grave suele realizarse en la unidad de cuidados intensivos (UCI) de un hospital. Puede incluir uno o ms de los siguientes. Estos tratamientos se administran hasta que los sntomas mejoran.  Recibir lquidos y [employer] Liberta Gimpel travs de una va intravenosa.  Oxgeno complementario. Para administrar oxgeno extra, se [place] un tubo en la Darene Lamer, una mascarilla o una campana de oxgeno.  Colocarlo para que se recueste boca abajo (decbito prono). Esto facilita el ingreso de oxgeno Dagny Fiorentino los pulmones.  Uso continuo de [place] de presin positiva de las vas areas (CPAP) o de presin positiva de las vas areas de dos niveles (BPAP). Este tratamiento utiliza una presin de aire leve para Pharmacologist las vas respiratorias abiertas.  Un tubo conectado Kenslei Hearty un motor administra oxgeno al cuerpo.  Respirador. Este tratamiento mueve el aire dentro y fuera de los pulmones mediante el uso de un tubo que se coloca en la trquea.  Traqueostoma. En este procedimiento se hace un orificio en el cuello para insertar un tubo de respiracin.  Oxigenacin por membrana extracorprea (OMEC). En este procedimiento, los pulmones tienen la posibilidad de recuperarse al asumir las funciones del corazn y los pulmones. Suministra oxgeno al cuerpo y elimina el dixido de carbono. Siga estas instrucciones en su casa: Estilo de vida  Si est enfermo, qudese en su casa, excepto para obtener atencin mdica. El mdico le indicar cunto tiempo debe quedarse en casa. Llame al mdico antes de buscar atencin mdica.  Haga reposo en su casa como se lo haya indicado el mdico.  No consuma ningn producto que contenga nicotina o tabaco, como cigarrillos, cigarrillos electrnicos y tabaco de Theatre manager. Si necesita ayuda para dejar de fumar, consulte al mdico.  Retome sus actividades normales segn lo indicado por el mdico. Pregntele al mdico qu actividades son seguras para usted. Instrucciones generales  Use los medicamentos de venta libre y los recetados solamente como se lo haya indicado el mdico.  Beba suficiente lquido como para Pharmacologist la orina de color amarillo plido.  Concurra Angelic Schnelle todas las visitas de [address] se lo haya indicado el mdico. Esto es importante. Cmo se evita?  No hay ninguna vacuna que ayude Aricka Goldberger  prevenir la infeccin por COVID-19. Sin embargo, hay medidas que puede tomar para protegerse y Conservator, museum/galleryproteger Zane Pellecchia Economistotras personas de este virus. Para protegerse:   No viaje Emelie Newsom zonas donde el COVID-19 sea un riesgo. Las zonas donde se informa la presencia del COVID-19 cambian con frecuencia. Para identificar las zonas de alto riesgo y las restricciones de viaje, consulte el sitio web de viajes de Building control surveyorlos Centers for Micron TechnologyDisease Control and  Prevention Insurance claims handler(CDC) (Centros para el Control y la Prevencin de Event organisernfermedades): StageSync.siwwwnc.cdc.gov/travel/notices  Si vive o debe viajar Luceil Herrin una zona donde el COVID-19 es un riesgo, tome precauciones para evitar infecciones. ? Aljese de Engelhard Corporationlas personas enfermas. ? Lvese las manos frecuentemente con agua y Sidneyjabn durante 20segundos. Use desinfectante para manos con alcohol si no dispone de [place] y [place]. ? Evite tocarse la boca, la cara, los ojos o la Whitetailnariz. ? Evite salir de su casa, siga las indicaciones de su estado y de las autoridades sanitarias locales. ? Si debe salir de su casa, use un barbijo de tela o una mascarilla facial. Asegrese de que le cubra la nariz y la boca. ? Evite los espacios interiores repletos de gente. Mantenga una distancia de al menos 6 pies (2 metros) de Economistotras personas. ? Desinfecte los objetos y las superficies que se tocan con frecuencia todos [place] das. Pueden incluir:  Encimeras y Childressmesas.  Picaportes e interruptores de luz.  Lavabos, fregaderos y grifos.  Aparatos electrnicos tales como telfonos, controles remotos, teclados, computadoras y tabletas. Cmo proteger Courteny Egler los dems: Si tiene sntomas de COVID-19, tome medidas para evitar que el virus se propague Jaysun Wessels Economistotras personas.  Si cree que tiene una infeccin por COVID-19, comunquese de inmediato con su mdico. Informe al equipo de atencin mdica que cree que puede tener una infeccin por el COVID-19.  Qudese en su casa. Salga de su casa solo para buscar atencin mdica. No utilice el transporte pblico.  No viaje mientras est enfermo.  Lvese las manos frecuentemente con agua y [place]jabn durante 20segundos. Usar desinfectante para manos con alcohol si no dispone de [place] y [place].  Mantngase alejado de quienes vivan con usted. Permita que los miembros de la familia sanos cuiden Kelsi Benham los nios y las [place]ascotas, si es posible. Si tiene que cuidar Shalanda Brogden los nios o las mascotas, lvese las manos con frecuencia y use un barbijo. Si es  posible, permanezca en su habitacin, separado de los dems. Utilice un bao diferente.  Asegrese de que todas las personas que viven en su casa se laven bien las manos y con frecuencia.  Tosa o estornude en un pauelo de papel o sobre su manga o codo. No tosa o estornude al aire ni se cubra la boca o la nariz con la [place].  Use un barbijo de tela o una mascarilla facial. Asegrese de que le cubra la nariz y la boca. Dnde buscar ms informacin  Centers for Disease Control and Prevention (Centros para el Control y la Prevencin de Event organisernfermedades): StickerEmporium.tnwww.cdc.gov/coronavirus/2019-ncov/index.html  World Health Organization (Organizacin Mundial de la Salud): https://thompson-craig.com/www.who.int/health-topics/coronavirus Comunquese con un mdico si:  Vive o ha viajado Jaqwan Wieber una zona donde el COVID-19 es un riesgo y tiene sntomas de infeccin.  Ha tenido contacto con alguien que tiene COVID-19 y usted tiene sntomas de infeccin. Solicite ayuda inmediatamente si:  Tiene dificultad para respirar.  Siente dolor u opresin en el pecho.  Experimenta confusin.  Tiene las uas de los dedos y los labios de color Villa Sin Miedoazulado.  Tiene dificultad para despertarse.  Los sntomas empeoran. Estos sntomas pueden representar un problema grave que constituye Radio broadcast assistant. No espere Tian Davison ver si los sntomas desaparecen. Solicite atencin mdica de inmediato. Comunquese con el servicio de emergencias de su localidad (911 en los [place]). No conduzca por sus propios medios [employer] hospital. Informe al personal mdico de emergencias si cree que tiene COVID-19. Resumen  El COVID-19 es una infeccin respiratoria causada por un virus. Tambin se conoce como enfermedad por coronavirus o nuevo coronavirus. Puede causar infecciones graves, como neumona, sndrome de dificultad respiratoria aguda, insuficiencia respiratoria aguda o sepsis.  El virus que causa el COVID-19 es contagioso. Esto significa que puede transmitirse de [place] persona  Eilis Chestnutt otra Tamalyn Wadsworth travs de las gotitas que se despiden al respirar, Heritage manager, cantar, toser y Engineering geologist.  Es ms probable que desarrolle una enfermedad grave si tiene 50 aos o ms, tiene el sistema inmunitario dbil, vive en un hogar de ancianos o tiene una enfermedad crnica.  No hay ningn medicamento para tratar el COVID-19. El mdico le informar sobre las maneras de tratar los sntomas.  Tome medidas para protegerse y Conservator, museum/gallery Dierdre Mccalip los Merchandiser, retail las infecciones. Lvese las manos con frecuencia y desinfecte los objetos y las superficies que se tocan con frecuencia todos [place]. Mantngase alejado de las personas que estn enfermas y use un barbijo si est enfermo. Esta informacin no tiene Theme park manager el consejo del mdico. Asegrese de hacerle al mdico cualquier pregunta que tenga. Document Revised: 11/07/2018 Document Reviewed: 03/02/2018 Elsevier Patient Education  2020 [employer].

## 2019-09-13 NOTE — Progress Notes (Signed)
Discharge instructions given to patient, tele removed and Iv Removed . Teresa Orozco Husband Transporting home.

## 2019-09-13 NOTE — Progress Notes (Signed)
PT Cancellation Note  Patient Details Name: Teresa Orozco MRN: [id number] DOB: 02-Oct-1975   Cancelled Treatment:    Reason Eval/Treat Not Completed: Other (comment);PT screened, no needs identified, will sign off. Per nsg pt is on room air and not needing physical assist at all.  Discharge for now.  Please reorder PT if pt condition changes.   Ivar Drape 09/13/2019, 9:39 AM   Samul Dada, PT MS Acute Rehab Dept. Number: [facility] [id number] and [facility] [phone]

## 2019-09-13 NOTE — Discharge Summary (Signed)
Physician Discharge Summary  Teresa Orozco PIR:[id number] DOB: 1975/07/24 DOA: 09/08/2019  PCP: Patient, No Pcp Per  Admit date: 09/08/2019 Discharge date: 09/13/2019  Time spent: 40 minutes  Recommendations for Outpatient Follow-up:  1. Follow outpatient CBC/CMP 2. Follow quarantine guidelines per CDC - until 9/13 3. Needs follow up for goiter, needs ENT follow up  Discharge Diagnoses:  Principal Problem:   Pneumonia due to COVID-19 virus Active Problems:   UTI (urinary tract infection)   Severe sepsis (HCC)   Hypokalemia   Goiter   Discharge Condition: stable  Diet recommendation: heart healthy  Filed Weights   09/11/19 0100 09/12/19 0500 09/13/19 0600  Weight: 74.6 kg 73.9 kg 73.4 kg    History of present illness:  Teresa Orozco 44 y.o.femalewithno significant past medical history presenting with complaints of fevers and malaise.Spanish interpreter services used. Patient reports feeling sick for the past 2 weeks. She is having fevers, malaise, and cough. Denies having shortness of breath, chest pain, abdominal pain, diarrhea, or dysuria. States she vomited after taking Tylenol at home but is not vomiting otherwise and not nauseous. She has not been vaccinated against Covid. She was septic on admission with severe lactic acidosis, which has now resolved.     Hospital Course:  Acute Hypoxic Respiratory Failure 2/2 COVID-19 viral multifocal pneumonia: Unvaccinated  CXR with patchy airspace opacities in R mid and lower lung zone CT PE protocol with multifocal consolidation and scattered areas of ground glass pulm infiltrate Currently doing well on RA Continue steroids, remdesivir completed - discharge with steroids Strict I/O, daily weights  Daily inflammatory labs OOB, prone as able, IS, flutter  COVID-19 Labs  Recent Labs    09/11/19 0730 09/12/19 0613 09/13/19 0240  DDIMER 0.74* 0.63* 0.76*  CRP 1.9* 0.9 1.4*    Lab Results  Component Value Date    SARSCOV2NAA POSITIVE (Adda Stokes) 09/08/2019   Severe sepsis: In the setting of COVID-19 viral multifocal pneumonia and UTI. Lactate above 11 at presentation. Procalcitonin < 0.1 UA with positive nitrites, small LE, few bacteria, 21-50 WBC concerning for UTI, abx given before urine collected Urine cx no growth Blood cx no growth to date, continue to monitor  S/p 5 days ceftriaxone  UTI: No leukocytosis on labs.UA with positive nitrite, small amount of leukocytes, 21-50 WBCs, and few bacteria.  She notes dysuria. -Continue ceftriaxone (will treat x5 days). urine cx no growth, but obtained after abx.  Elevated D dimer: LE [place] negative, CTA 8/24 without PE  Mild hypokalemia  Hypomagnesemia: resolved  Transaminitis:Likely related to COVID-19 viral infection, fluctuating. Patient denies history of alcohol use. 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 -Check TSH and free T4 levels (wnl) -will need ENT follow up at time of discharge  Procedures: LE [place] Summary:  BILATERAL:  - No evidence of deep vein thrombosis seen in the lower extremities,  bilaterally.  - No evidence of superficial venous thrombosis in the lower extremities,  bilaterally.   Consultations:  none  Discharge Exam: Vitals:   09/13/19 1200 09/13/19 1204  BP: 99/61   Pulse:    Resp: 17   Temp:    SpO2:  93%   No new complaints Discussed discharge recommendations and return precautions Discussed need for f/u with PCP for goiter Spanish interpreter used  General: No acute distress.  Cardiovascular: Heart sounds show Quandre Polinski regular rate, and rhythm.  Lungs: Clear to auscultation bilaterally. Abdomen: Soft, nontender, nondistended Neurological: Alert and oriented 3. Moves all extremities 4.  Cranial nerves II through XII grossly intact. Skin: Warm and dry. No rashes or lesions. Extremities: No clubbing or cyanosis. No edema.  Discharge Instructions   Discharge Instructions    Call MD for:  difficulty breathing, headache or visual disturbances   Complete by: As directed    Call MD for:  extreme fatigue   Complete by: As directed    Call MD for:  hives   Complete by: As directed    Call MD for:  persistant dizziness or light-headedness   Complete by: As directed    Call MD for:  persistant nausea and vomiting   Complete by: As directed    Call MD for:  redness, tenderness, or signs of infection (pain, swelling, redness, odor or green/yellow discharge around incision site)   Complete by: As directed    Call MD for:  severe uncontrolled pain   Complete by: As directed    Call MD for:  temperature >100.4   Complete by: As directed    Diet - low sodium heart healthy   Complete by: As directed    Discharge instructions   Complete by: As directed    You were seen for COVID 19 pneumonia.   You've improved with steroids and remdesivir.  You should continue to quarantine until 9/13.  You can discontinue your quarantine after this date as long as your symptoms continue to improve without the need for fever reducing medicines.  You need to follow up with your PCP to follow up the thyroid goiter.  You may need surgery for this.  Ask your PCP for [place] referral to the ear, nose, and throat doctors.  Return for new, recurrent, or worsening symptoms.  Please ask your PCP to request records from this hospitalization so they know what was done and what the next steps will be.   Increase activity slowly   Complete by: As directed      Allergies as of 09/13/2019   No Known Allergies     Medication List    STOP taking these medications   fluconazole 150 MG tablet Commonly known as: DIFLUCAN   ibuprofen 200 MG tablet Commonly known as: ADVIL   medroxyPROGESTERone 150 MG/ML  injection Commonly known as: DEPO-PROVERA   Prenatal Vitamins 0.8 MG tablet     TAKE these medications   acetaminophen 325 MG tablet Commonly known as: TYLENOL Take 650 mg by mouth every 6 (six) hours as needed for fever.   dexamethasone 6 MG tablet Commonly known as: DECADRON Take 1 tablet (6 mg total) by mouth daily for 5 days. Start taking on: September 14, 2019      No Known Allergies  Follow-up Information    [facility] Call.   Specialty: Family Medicine Why: Call to schedule appointment for hospital follow up and to get Song Myre primary care doctor Contact information: [address] [place] [id number] [phone]               The results of significant diagnostics from this hospitalization (including imaging, microbiology, ancillary and laboratory) are listed below for reference.    Significant Diagnostic Studies: DG Chest 1 View  Result Date: 09/08/2019 CLINICAL DATA:  Fever, fall, dizziness.  Questionable sepsis. EXAM: CHEST  1 VIEW COMPARISON:  07/11/2013 FINDINGS: Patchy airspace opacities in the right  mid lower lung zone. Questionable mild patchy opacity at the left lung base. Heart is normal in size with normal mediastinal contours. No pleural fluid or pneumothorax. No acute osseous abnormalities are seen. IMPRESSION: Patchy airspace opacities in the right mid and lower lung zone and questionably at the left lung base, suspicious for pneumonia in the setting of fever. Electronically Signed   By: Narda Rutherford M.D.   On: 09/08/2019 22:05   CT Angio Chest PE W and/or Wo Contrast  Result Date: 09/09/2019 CLINICAL DATA:  Nausea, vomiting, fever, dyspnea EXAM: CT ANGIOGRAPHY CHEST WITH CONTRAST TECHNIQUE: Multidetector CT imaging of the chest was performed using the standard protocol during bolus administration of intravenous contrast. Multiplanar CT image reconstructions and MIPs were obtained to evaluate the vascular anatomy.  CONTRAST:  87mL OMNIPAQUE IOHEXOL 350 MG/ML SOLN COMPARISON:  None. FINDINGS: Cardiovascular: There is excellent opacification of the pulmonary arterial tree. No intraluminal filling defect to suggest acute pulmonary embolism. Central pulmonary arteries are of normal caliber. Cardiac size within normal limits. No significant coronary artery calcification. No pericardial effusion. Thoracic aorta is of normal caliber. 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 Shotty mediastinal and right hilar adenopathy may be reactive in nature. No frankly pathologic thoracic adenopathy. The esophagus is unremarkable. Lungs/Pleura: There is extensive multifocal consolidation and scattered areas of ground-glass pulmonary infiltrate in keeping with atypical infection in the appropriate clinical setting. The findings are compatible with acute COVID-19 pneumonia. There is no pneumothorax or pleural effusion. The central airways are widely patent. Upper Abdomen: No acute abnormality. Musculoskeletal: No chest wall abnormality. No acute or significant osseous findings. Review of the MIP images confirms the above findings. IMPRESSION: 1. No evidence of acute pulmonary embolism. 2. Extensive multifocal consolidation and scattered areas of ground-glass pulmonary infiltrate in keeping with acute COVID-19 pneumonia in the appropriate clinical setting. 3. 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 Electronically Signed   By: Helyn Numbers MD   On: 09/09/2019 00:50   VAS [place] LOWER EXTREMITY VENOUS (DVT)  Result Date: 09/11/2019  Lower Venous DVTStudy Indications: Covid ++ elevated ddimer.  Comparison Study: no prior Performing Technologist: Blanch Media RVS  Examination Guidelines: Alverto Shedd complete evaluation includes B-mode imaging, spectral Doppler, color Doppler,  and power Doppler as needed of all accessible portions of each vessel. Bilateral testing is considered an integral part of Lenay Lovejoy complete examination. Limited examinations for reoccurring indications may be performed as noted. The reflux portion of the exam is performed with the patient in reverse Trendelenburg.  +---------+---------------+---------+-----------+----------+--------------+ RIGHT    CompressibilityPhasicitySpontaneityPropertiesThrombus Aging +---------+---------------+---------+-----------+----------+--------------+ CFV      Full           Yes      Yes                                 +---------+---------------+---------+-----------+----------+--------------+ SFJ      Full                                                        +---------+---------------+---------+-----------+----------+--------------+ FV Prox  Full                                                        +---------+---------------+---------+-----------+----------+--------------+  FV Mid   Full                                                        +---------+---------------+---------+-----------+----------+--------------+ FV DistalFull                                                        +---------+---------------+---------+-----------+----------+--------------+ PFV      Full                                                        +---------+---------------+---------+-----------+----------+--------------+ POP      Full           Yes      Yes                                 +---------+---------------+---------+-----------+----------+--------------+ PTV      Full                                                        +---------+---------------+---------+-----------+----------+--------------+ PERO     Full                                                        +---------+---------------+---------+-----------+----------+--------------+    +---------+---------------+---------+-----------+----------+--------------+ LEFT     CompressibilityPhasicitySpontaneityPropertiesThrombus Aging +---------+---------------+---------+-----------+----------+--------------+ CFV      Full           Yes      Yes                                 +---------+---------------+---------+-----------+----------+--------------+ SFJ      Full                                                        +---------+---------------+---------+-----------+----------+--------------+ FV Prox  Full                                                        +---------+---------------+---------+-----------+----------+--------------+ FV Mid   Full                                                        +---------+---------------+---------+-----------+----------+--------------+   FV DistalFull                                                        +---------+---------------+---------+-----------+----------+--------------+ PFV      Full                                                        +---------+---------------+---------+-----------+----------+--------------+ POP      Full           Yes      Yes                                 +---------+---------------+---------+-----------+----------+--------------+ PTV      Full                                                        +---------+---------------+---------+-----------+----------+--------------+ PERO     Full                                                        +---------+---------------+---------+-----------+----------+--------------+     Summary: BILATERAL: - No evidence of deep vein thrombosis seen in the lower extremities, bilaterally. - No evidence of superficial venous thrombosis in the lower extremities, bilaterally. -   *See table(s) above for measurements and observations. Electronically signed by Sherald Hess MD on 09/11/2019 at 3:19:47 PM.    Final      Microbiology: Recent Results (from the past 240 hour(s))  Blood culture (routine x 2)     Status: None   Collection Time: 09/08/19 10:08 PM   Specimen: BLOOD  Result Value Ref Range Status   Specimen Description BLOOD RIGHT ANTECUBITAL  Final   Special Requests   Final    BOTTLES DRAWN AEROBIC AND ANAEROBIC Blood Culture adequate volume   Culture   Final    NO GROWTH 5 DAYS Performed at [facility], [address], [place], [place] [address]    Report Status 09/13/2019 FINAL  Final  SARS Coronavirus 2 by RT PCR (hospital order, performed in [facility] hospital lab) Nasopharyngeal Nasopharyngeal Swab     Status: Abnormal   Collection Time: 09/08/19 10:18 PM   Specimen: Nasopharyngeal Swab  Result Value Ref Range Status   SARS Coronavirus 2 POSITIVE (Charese Abundis) NEGATIVE Final    Comment: RESULT CALLED TO, READ BACK BY AND VERIFIED WITH: RN M COCHRANE @0027  09/09/19 BY S GEZAHEGN (NOTE) SARS-CoV-2 target nucleic acids are DETECTED  SARS-CoV-2 RNA is generally detectable in upper respiratory specimens  during the acute phase of infection.  Positive results are indicative  of the presence of the identified virus, but do not rule out bacterial infection or co-infection with other pathogens not detected by the test.  Clinical correlation with patient history and  other diagnostic  information is necessary to determine patient infection status.  The expected result is negative.  Fact Sheet for Patients:   BoilerBrush.com.cy   Fact Sheet for Healthcare Providers:   https://pope.com/    This test is not yet approved or cleared by the [place] FDA and  has been authorized for detection and/or diagnosis of SARS-CoV-2 by FDA under an Emergency Use Authorization (EUA).  This EUA will remain in effect (meaning  this test can be used) for the duration of  the COVID-19 declaration under Section 564(b)(1) of the Act, 21 U.S.C. section  360-bbb-3(b)(1), unless the authorization is terminated or revoked sooner.  Performed at [facility], [address] [place], [place] [address]   Blood culture (routine x 2)     Status: None   Collection Time: 09/08/19 10:19 PM   Specimen: BLOOD RIGHT HAND  Result Value Ref Range Status   Specimen Description BLOOD RIGHT HAND  Final   Special Requests   Final    BOTTLES DRAWN AEROBIC ONLY Blood Culture results may not be optimal due to an inadequate volume of blood received in culture bottles   Culture   Final    NO GROWTH 5 DAYS Performed at [facility], [address], [place], [place] [address]    Report Status 09/13/2019 FINAL  Final  Urine culture     Status: None   Collection Time: 09/09/19  2:43 AM   Specimen: In/Out Cath Urine  Result Value Ref Range Status   Specimen Description IN/OUT CATH URINE  Final   Special Requests NONE  Final   Culture   Final    NO GROWTH Performed at [facility], [address] [place], [place] [address]    Report Status 09/10/2019 FINAL  Final     Labs: Basic Metabolic Panel: Recent Labs  Lab 09/08/19 2208 09/09/19 0527 09/10/19 0500 09/11/19 0730 09/12/19 0613 09/13/19 0240  NA 132*  --  139 139 143 140  K 3.2*  --  4.1 3.6 3.6 3.7  CL 98  --  107 106 108 106  CO2 21*  --  GLUCOSE 117*  --  143* 145* 119* 118*  BUN 20  --  CREATININE 1.07*  --  0.62 0.58 0.71 0.58  CALCIUM 8.3*  --  8.0* 7.9* 7.9* 7.8*  MG  --  1.6* 1.9  --   --   --    Liver Function Tests: Recent Labs  Lab 09/08/19 2208 09/10/19 0500 09/11/19 0730 09/12/19 0613 09/13/19 0240  AST 100* 46* 45* 86* 58*  ALT 95* 52* 46* 93* 91*  ALKPHOS 109 86 77 79 80  BILITOT 0.7 0.4 0.2* 0.6 0.3  PROT 7.3 6.2* 6.0* 5.9* 5.8*  ALBUMIN 3.3* 2.5* 2.4* 2.5* 2.4*   No results for input(s): LIPASE, AMYLASE in the last 168 hours. No results for input(s): AMMONIA in the last 168 hours. CBC: Recent Labs  Lab  09/08/19 2208 09/10/19 0500 09/11/19 0730 09/12/19 0613 09/13/19 0240  WBC 6.6 4.6 8.2 8.1 8.2  NEUTROABS 5.6 3.2 7.3 6.9 5.7  HGB 14.5 12.7 11.7* 12.2 11.7*  HCT 44.0 39.3 36.3 37.3 35.6*  MCV 90.0 92.3 90.5 91.4 91.0  PLT 203 274 291 319 352   Cardiac Enzymes: No results for input(s): CKTOTAL, CKMB, CKMBINDEX, TROPONINI in the last 168 hours. BNP: BNP (last 3 results) Recent Labs    09/08/19 2241  BNP 18.9    ProBNP (last 3 results) No results for input(s): PROBNP in the last 8760 hours.  CBG: Recent Labs  Lab 09/09/19 1500  GLUCAP 141*       Signed:  Lacretia Nicks MD.  [facility] 09/13/2019, 2:24 PM

## 2019-11-06 ENCOUNTER — Other Ambulatory Visit: Payer: Self-pay

## 2019-11-06 ENCOUNTER — Ambulatory Visit (INDEPENDENT_AMBULATORY_CARE_PROVIDER_SITE_OTHER): Payer: Self-pay | Admitting: Nurse Practitioner

## 2019-11-06 VITALS — BP 116/82 | HR 90 | Temp 97.5°F | Wt 169.0 lb

## 2019-11-06 DIAGNOSIS — M549 Dorsalgia, unspecified: Secondary | ICD-10-CM

## 2019-11-06 DIAGNOSIS — Z8616 Personal history of COVID-19: Secondary | ICD-10-CM

## 2019-11-06 DIAGNOSIS — M62838 Other muscle spasm: Secondary | ICD-10-CM

## 2019-11-06 DIAGNOSIS — E049 Nontoxic goiter, unspecified: Secondary | ICD-10-CM

## 2019-11-06 MED ORDER — PREDNISONE 20 MG PO TABS: 20.0000 mg | ORAL_TABLET | Freq: Every day | ORAL | 0 refills | Status: AC

## 2019-11-06 MED ORDER — TIZANIDINE HCL 4 MG PO TABS: 4.0000 mg | ORAL_TABLET | Freq: Four times a day (QID) | ORAL | 0 refills | Status: DC | PRN

## 2019-11-06 NOTE — Progress Notes (Signed)
@Patient  ID: , female    DOB: 1975-12-07, 44 y.o.   MRN: [id number]  Chief Complaint  Patient presents with  . Hospitalization Follow-up    COVID 8/23-8/28 Back pain takes Tylenol with no relief    Referring provider: No ref. provider found   44 year old female with no significant health history.  Diagnosed with Covid in August 2021.  HPI  Patient presents today for post COVID care clinic visit and hospital follow-up.  Patient was admitted to the hospital on 09/08/2019 and discharged on 09/13/2019 for Covid pneumonia and sepsis.  Patient was treated with remdesivir, IV steroids.  CT scan that revealed obstructive Gorder extending substernally and widens the mediastinum at the thoracic inlet with mild mass-effect upon the airway.  TSH and T4 levels were checked in the hospital and were normal.  Hospitalist recommended ENT referral at discharge.  We discussed this with patient today via Spanish interpreter and will place referral to ENT.  Patient does not currently have a primary care physician.  We will set up an appointment to establish care with PCP before patient leaves the office today.  Patient will need repeat lab work due to elevated liver enzymes.  Patient will also need follow-up chest x-ray.  Overall patient has been doing well since hospital discharge.  She does complain today of some upper back and neck pain worse on the right side.  Upon exam she does have muscle tightness and spasm. Denies f/c/s, n/v/d, hemoptysis, PND, chest pain or edema.       No Known Allergies  There is no immunization history for the selected administration types on file for this patient.  Past Medical History:  Diagnosis Date  . Medical history non-contributory     Tobacco History: Social History   Tobacco Use  Smoking Status Never Smoker  Smokeless Tobacco Never Used   Counseling given: Not Answered   Outpatient Encounter Medications as of 11/06/2019  Medication Sig  .  acetaminophen (TYLENOL) 325 MG tablet Take 650 mg by mouth every 6 (six) hours as needed for fever.  . predniSONE (DELTASONE) 20 MG tablet Take 1 tablet (20 mg total) by mouth daily with breakfast for 5 days.  11/08/2019 tiZANidine (ZANAFLEX) 4 MG tablet Take 1 tablet (4 mg total) by mouth every 6 (six) hours as needed for muscle spasms.   No facility-administered encounter medications on file as of 11/06/2019.     Review of Systems  Review of Systems  Constitutional: Negative.  Negative for fatigue and fever.  HENT: Negative.   Respiratory: Negative for cough and shortness of breath.   Cardiovascular: Negative.  Negative for chest pain, palpitations and leg swelling.  Gastrointestinal: Negative.   Musculoskeletal: Positive for myalgias and neck pain.  Allergic/Immunologic: Negative.   Neurological: Negative.   Psychiatric/Behavioral: Negative.        Physical Exam  BP 116/82   Pulse 90   Temp (!) 97.5 F (36.4 C)   Wt 169 lb (76.7 kg)   SpO2 98%   BMI 27.28 kg/m   Wt Readings from Last 5 Encounters:  11/06/19 169 lb (76.7 kg)  09/13/19 161 lb 13.1 oz (73.4 kg)  09/04/18 177 lb (80.3 kg)  08/27/18 178 lb (80.7 kg)  09/20/17 174 lb (78.9 kg)     Physical Exam Vitals and nursing note reviewed.  Constitutional:      General: She is not in acute distress.    Appearance: She is well-developed.  Neck:     Thyroid:  Thyroid mass present. No thyroid tenderness.  Cardiovascular:     Rate and Rhythm: Normal rate and regular rhythm.  Pulmonary:     Effort: Pulmonary effort is normal.     Breath sounds: Normal breath sounds.  Musculoskeletal:     Right shoulder: Tenderness present.       Arms:     Right lower leg: No edema.     Left lower leg: No edema.     Comments: Muscle spasm noted.   Neurological:     Mental Status: She is alert and oriented to person, place, and time.  Psychiatric:        Mood and Affect: Mood normal.        Behavior: Behavior normal.      Lab  Results:  CBC    Component Value Date/Time   WBC 8.2 09/13/2019 0240   RBC 3.91 09/13/2019 0240   HGB 11.7 (L) 09/13/2019 0240   HGB 11.5 05/21/2017 0930   HCT 35.6 (L) 09/13/2019 0240   HCT 34.3 05/21/2017 0930   PLT 352 09/13/2019 0240   PLT 298 05/21/2017 0930   MCV 91.0 09/13/2019 0240   MCV 90 05/21/2017 0930   MCH 29.9 09/13/2019 0240   MCHC 32.9 09/13/2019 0240   RDW 13.9 09/13/2019 0240   RDW 15.9 (H) 05/21/2017 0930   LYMPHSABS 1.2 09/13/2019 0240   LYMPHSABS 1.8 01/23/2017 0952   MONOABS 0.9 09/13/2019 0240   EOSABS 0.0 09/13/2019 0240   EOSABS 0.1 01/23/2017 0952   BASOSABS 0.0 09/13/2019 0240   BASOSABS 0.0 01/23/2017 0952    BMET    Component Value Date/Time   NA 140 09/13/2019 0240   NA 138 01/23/2017 0952   K 3.7 09/13/2019 0240   CL 106 09/13/2019 0240   CO2 24 09/13/2019 0240   GLUCOSE 118 (H) 09/13/2019 0240   BUN 14 09/13/2019 0240   BUN 6 01/23/2017 0952   CREATININE 0.58 09/13/2019 0240   CALCIUM 7.8 (L) 09/13/2019 0240   GFRNONAA >60 09/13/2019 0240   GFRAA >60 09/13/2019 0240    BNP    Component Value Date/Time   BNP 18.9 09/08/2019 2241    ProBNP No results found for: PROBNP  Imaging: No results found.   Assessment & Plan:   History of COVID-19 Stay well hydrated  Stay active  Deep breathing exercises  May start multivitamin  May take tylenol or fever or pain  Will order chest x ray    Upper back pain Muscle spasm:  Warm wet compresses   May use ibuprofen  gentle stretching  Will order muscle relaxer  Will order prednisone   Abnormal CT scan Goiter:  Referral placed to ENT to follow on goiter found on CT  Will set up appointment for PCP for routine care    Follow up:  Follow up as needed      Ivonne Andrew, NP 11/06/2019

## 2019-11-06 NOTE — Patient Instructions (Addendum)
Covid 19:  Stay well hydrated  Stay active  Deep breathing exercises  May start multivitamin  May take tylenol or fever or pain  Will order chest x ray    Upper back pain Muscle spasm:  Warm wet compresses   May use ibuprofen  gentle stretching  Will order muscle relaxer  Will order prednisone   Abnormal CT scan Goiter:  Referral placed to ENT to follow on goiter found on CT  Will set up appointment for PCP for routine care    Follow up:  Follow up as needed

## 2019-11-06 NOTE — Assessment & Plan Note (Signed)
Stay well hydrated  Stay active  Deep breathing exercises  May start multivitamin  May take tylenol or fever or pain  Will order chest x ray    Upper back pain Muscle spasm:  Warm wet compresses   May use ibuprofen  gentle stretching  Will order muscle relaxer  Will order prednisone   Abnormal CT scan Goiter:  Referral placed to ENT to follow on goiter found on CT  Will set up appointment for PCP for routine care    Follow up:  Follow up as needed

## 2019-11-07 ENCOUNTER — Ambulatory Visit: Payer: Self-pay

## 2019-11-07 LAB — CBC
Hematocrit: 39.1 % (ref 34.0–46.6)
Hemoglobin: 13.3 g/dL (ref 11.1–15.9)
MCH: 30.4 pg (ref 26.6–33.0)
MCHC: 34 g/dL (ref 31.5–35.7)
MCV: 89 fL (ref 79–97)
Platelets: 335 10*3/uL (ref 150–450)
RBC: 4.38 x10E6/uL (ref 3.77–5.28)
RDW: 14.2 % (ref 11.7–15.4)
WBC: 7.2 10*3/uL (ref 3.4–10.8)

## 2019-11-07 LAB — COMPREHENSIVE METABOLIC PANEL
ALT: 17 IU/L (ref 0–32)
AST: 18 IU/L (ref 0–40)
Albumin/Globulin Ratio: 1.5 (ref 1.2–2.2)
Albumin: 4 g/dL (ref 3.8–4.8)
Alkaline Phosphatase: 85 IU/L (ref 44–121)
BUN/Creatinine Ratio: 9 (ref 9–23)
BUN: 6 mg/dL (ref 6–24)
Bilirubin Total: 0.5 mg/dL (ref 0.0–1.2)
CO2: 22 mmol/L (ref 20–29)
Calcium: 8.9 mg/dL (ref 8.7–10.2)
Chloride: 102 mmol/L (ref 96–106)
Creatinine, Ser: 0.64 mg/dL (ref 0.57–1.00)
GFR calc Af Amer: 126 mL/min/{1.73_m2} (ref >59)
GFR calc non Af Amer: 109 mL/min/{1.73_m2} (ref >59)
Globulin, Total: 2.6 g/dL (ref 1.5–4.5)
Glucose: 97 mg/dL (ref 65–99)
Potassium: 3.7 mmol/L (ref 3.5–5.2)
Sodium: 139 mmol/L (ref 134–144)
Total Protein: 6.6 g/dL (ref 6.0–8.5)

## 2019-12-01 ENCOUNTER — Encounter: Payer: Self-pay | Admitting: Nurse Practitioner

## 2019-12-01 ENCOUNTER — Other Ambulatory Visit: Payer: Self-pay

## 2019-12-01 ENCOUNTER — Ambulatory Visit (INDEPENDENT_AMBULATORY_CARE_PROVIDER_SITE_OTHER): Payer: HRSA Program | Admitting: Nurse Practitioner

## 2019-12-01 VITALS — BP 126/79 | HR 78 | Temp 99.0°F | Resp 18 | Ht 64.5 in | Wt 172.6 lb

## 2019-12-01 DIAGNOSIS — Z7689 Persons encountering health services in other specified circumstances: Secondary | ICD-10-CM

## 2019-12-01 DIAGNOSIS — E876 Hypokalemia: Secondary | ICD-10-CM

## 2019-12-01 DIAGNOSIS — D649 Anemia, unspecified: Secondary | ICD-10-CM

## 2019-12-01 DIAGNOSIS — R7989 Other specified abnormal findings of blood chemistry: Secondary | ICD-10-CM

## 2019-12-01 DIAGNOSIS — L659 Nonscarring hair loss, unspecified: Secondary | ICD-10-CM

## 2019-12-01 DIAGNOSIS — R82998 Other abnormal findings in urine: Secondary | ICD-10-CM

## 2019-12-01 LAB — POCT URINALYSIS DIP (CLINITEK)
Glucose, UA: NEGATIVE mg/dL
Nitrite, UA: NEGATIVE
POC PROTEIN,UA: >=300 — AB
Spec Grav, UA: 1.02 (ref 1.010–1.025)
Urobilinogen, UA: 1 E.U./dL
pH, UA: 8.5 — AB (ref 5.0–8.0)

## 2019-12-01 LAB — POCT GLYCOSYLATED HEMOGLOBIN (HGB A1C): Hemoglobin A1C: 5.3 % (ref 4.0–5.6)

## 2019-12-01 LAB — GLUCOSE, POCT (MANUAL RESULT ENTRY): POC Glucose: 116 mg/dl — AB (ref 70–99)

## 2019-12-01 MED ORDER — NITROFURANTOIN MONOHYD MACRO 100 MG PO CAPS: 100.0000 mg | ORAL_CAPSULE | Freq: Two times a day (BID) | ORAL | 0 refills | Status: AC

## 2019-12-01 MED ORDER — PRENATABS FA 29-1 MG PO TABS: 1.0000 | ORAL_TABLET | Freq: Every day | ORAL | 5 refills | Status: DC

## 2019-12-01 NOTE — Progress Notes (Signed)
[facility] Patient [facility] [address] [place], [place]  [address] Phone:  [phone]   Fax:  [phone]   New Patient Office Visit  Subjective:  Patient ID: Teresa Orozco, female    DOB: [birth year]  Age: 44 y.o. MRN: [id number]  CC:  Chief Complaint  Patient presents with   Establish Care    HPI Teresa Orozco presents to establish care. She  has a past medical history of Medical history non-contributory.   Urinary Tract Infection Patient complains of dysuria. She has had symptoms for 5 days. Patient also complains of vaginal discharge.Thick yellowish Patient denies congestion, cough, fever, headache, rhinitis, sorethroat and stomach ache. Patient does have a history of recurrent UTI. Patient does not have a history of pyelonephritis.     Past Medical History:  Diagnosis Date   Medical history non-contributory     Past Surgical History:  Procedure Laterality Date   DILATION AND CURETTAGE OF UTERUS  2008    History reviewed. No pertinent family history.  Social History   Socioeconomic History   Marital status: Married    Spouse name: Not on file   Number of children: 6   Years of education: Not on file   Highest education level: 6th grade  Occupational History   Not on file  Tobacco Use   Smoking status: Never Smoker   Smokeless tobacco: Never Used  Vaping Use   Vaping Use: Never used  Substance and Sexual Activity   Alcohol use: No   Drug use: No   Sexual activity: Yes    Birth control/protection: None  Other Topics Concern   Not on file  Social History Narrative   Not on file   Social Determinants of Health   Financial Resource Strain:    Difficulty of Paying Living Expenses: Not on file  Food Insecurity:    Worried About Programme researcher, broadcasting/film/video in the Last Year: Not on file   [employer] of Food in the Last Year: Not on file  Transportation Needs:    Lack of Transportation (Medical): Not on file   Lack of Transportation  (Non-Medical): Not on file  Physical Activity:    Days of Exercise per Week: Not on file   Minutes of Exercise per Session: Not on file  Stress:    Feeling of Stress : Not on file  Social Connections:    Frequency of Communication with Friends and Family: Not on file   Frequency of Social Gatherings with Friends and Family: Not on file   Attends Religious Services: Not on file   Active Member of Clubs or Organizations: Not on file   Attends Banker Meetings: Not on file   Marital Status: Not on file  Intimate Partner Violence:    Fear of Current or Ex-Partner: Not on file   Emotionally Abused: Not on file   Physically Abused: Not on file   Sexually Abused: Not on file    ROS Review of Systems  Objective:   Today's Vitals: BP 126/79 (BP Location: Left Arm, Patient Position: Sitting, Cuff Size: Normal)    Pulse 78    Temp 99 F (37.2 C) (Temporal)    Resp 18    Ht 5' 4.5" (1.638 m)    Wt 172 lb 9.6 oz (78.3 kg)    LMP 11/29/2019 (Exact Date)    SpO2 100%    Breastfeeding No    BMI 29.17 kg/m   Physical Exam Exam conducted with a  chaperone present.  HENT:     Head: Normocephalic and atraumatic.  Cardiovascular:     Rate and Rhythm: Normal rate and regular rhythm.     Pulses: Normal pulses.     Heart sounds: Normal heart sounds.  Pulmonary:     Effort: Pulmonary effort is normal.     Breath sounds: Normal breath sounds.  Abdominal:     General: Bowel sounds are normal.     Palpations: Abdomen is soft.  Musculoskeletal:        General: Normal range of motion.     Cervical back: Normal range of motion.  Skin:    General: Skin is warm.     Capillary Refill: Capillary refill takes less than 2 seconds.  Neurological:     General: No focal deficit present.     Mental Status: She is alert and oriented to person, place, and time.  Psychiatric:        Mood and Affect: Mood normal.        Behavior: Behavior normal.        Thought Content: Thought  content normal.        Judgment: Judgment normal.     Assessment & Plan:   Problem List Items Addressed This Visit      Other   Hypokalemia   Relevant Orders   Comp. Metabolic Panel (12) (Completed)    Other Visit Diagnoses    Establishing care with new doctor, encounter for    -  Primary   Relevant Orders   POCT URINALYSIS DIP (CLINITEK) (Completed)   HgB A1c (Completed)   Glucose (CBG) (Completed)   Hair loss     Encourage use of over-the-counter vitamins hair skin and nail   Relevant Medications   Prenatal Vit-Fe Fumarate-FA (PRENATABS FA) 29-1 MG TABS   Other Relevant Orders   TSH (Completed)   Magnesium (Completed)   Iron, TIBC and Ferritin Panel (Completed)   Anemia, unspecified type  Evaluation pending      Relevant Orders   Vitamin B12 (Completed)   Iron, TIBC and Ferritin Panel (Completed)   CBC with Differential/Platelet (Completed)   Positive D dimer       Relevant Orders   D-dimer, quantitative (not at [facility]) (Completed)   Leukocytes in urine    Large leukocytes with symptoms Empirical anbx treatment due to symptoms and history.  Urine culture pending Encourage completion of anbx even when symptoms improve.  Discussed resistance with anbx overuse Discussed allergic reactions with anbx.  Encourage increasing hydration with water and how to tell when this is achieved Add cranberry juice 100% 8-16 ozs daily until symptoms improve Discussed hygiene  Avoid not voiding when the urge presents      Outpatient Encounter Medications as of 12/01/2019  Medication Sig   acetaminophen (TYLENOL) 325 MG tablet Take 650 mg by mouth every 6 (six) hours as needed for fever.   tiZANidine (ZANAFLEX) 4 MG tablet Take 1 tablet (4 mg total) by mouth every 6 (six) hours as needed for muscle spasms.   nitrofurantoin, macrocrystal-monohydrate, (MACROBID) 100 MG capsule Take 1 capsule (100 mg total) by mouth 2 (two) times daily for 7 days.   Prenatal Vit-Fe Fumarate-FA  (PRENATABS FA) 29-1 MG TABS Take 1 tablet by mouth daily.   No facility-administered encounter medications on file as of 12/01/2019.    Follow-up: Return for [facility] visit (NuSwab) Thursday .   Barbette Merino, NP

## 2019-12-01 NOTE — Patient Instructions (Addendum)
Disuria Dysuria La disuria es dolor o molestia al [employer]. El dolor o la molestia se pueden sentir en la parte del cuerpo que transporta la orina fuera de la vejiga (uretra) o en el tejido que rodea los genitales. El dolor tambin se puede sentir en la zona de la ingle, en la parte inferior del abdomen y de la zona lumbar. Quizs tenga que orinar con frecuencia o la sensacin repentina de tener que orinar (tenesmo vesical). La disuria puede afectar tanto a hombres como a mujeres, pero es ms comn en las mujeres. La causa puede deberse a muchos problemas diferentes:  Infeccin de las vas urinarias.  Clculos renales o en la vejiga.  Ciertas enfermedades de transmisin sexual (ETS), como la clamidia.  Deshidratacin.  Inflamacin de los tejidos de la vagina.  Uso de ciertos medicamentos.  Uso de ciertos jabones o productos perfumados que provocan irritacin. Siga estas indicaciones en su casa: Instrucciones generales  Controle su afeccin para detectar cualquier cambio.  Orine con frecuencia. Evite retener la orina durante largos perodos.  Despus de defecar, las mujeres deben limpiarse desde adelante hacia atrs, usando el papel higinico solo [place].  Orine despus de [employer]  Concurra a todas las visitas de control como se lo haya indicado el mdico. Esto es importante.  Si le realizaron pruebas para Landscape architect causa de la disuria, es su responsabilidad retirar los resultados de [place]. Consulte al mdico o pregunte en el departamento donde se realiza la prueba cundo estarn [employer]. Comida y bebida   Beba suficiente lquido como para Pharmacologist la orina de color amarillo plido.  Evite la cafena, el t y el alcohol. Estos productos pueden Theatre manager vejiga y Probation officer disuria. En los hombres, el alcohol puede irritar la prstata. Medicamentos  [employer] de venta libre y los recetados solamente como se lo haya  indicado el mdico.  Si le recetaron un antibitico, tmelo como se lo haya indicado el mdico. No deje de tomar el antibitico aunque comience a sentirse mejor. Comunquese con un mdico si:  Tiene fiebre.  Siente dolor en la espalda o a los costados del cuerpo.  Tiene nuseas o vmitos.  Observa sangre en la orina.  Est orinando con ms frecuencia que lo habitual. Solicite ayuda de inmediato si:  El dolor es intenso y no se Chief Executive Officer con los medicamentos.  No puede comer ni beber sin vomitar.  Se siente confundido.  Tiene una frecuencia cardaca acelerada en reposo.  Tiene temblores o escalofros.  Se siente muy dbil. Resumen  La disuria es dolor o molestia al Geographical information systems officer. Existen muchas afecciones que pueden causar disuria.  Si tiene disuria, es posible que tenga que orinar con frecuencia o tenga la sensacin repentina de tener que orinar (tenesmo vesical).  Controle su afeccin para Insurance risk surveyor cambio. Concurra a todas las visitas de control como se lo haya indicado el mdico.  Asegrese de Geographical information systems officer con frecuencia y beber suficiente lquido para mantener la orina de color amarillo plido. Esta informacin no tiene Theme park manager el consejo del mdico. Asegrese de hacerle al mdico cualquier pregunta que tenga. Document Revised: 04/13/2017 Document Reviewed: 12/25/2016 Elsevier Patient Education  2020 Elsevier Inc.    HAIR SKIN NAIL   Most Common "Long Hauler" Symptoms  The list of "long hauler" symptoms is long and inconsistent. For some people, the lasting COVID symptoms are nothing like the original symptoms when they were first infected with COVID. The most common "  long hauler' symptoms include:  Coughing Ongoing, sometimes debilitating, fatigue Body aches Joint pain Shortness of breath Loss of taste and smell -- even if this didn't occur during the height of illness Difficulty sleeping Headaches Brain fog Brain fog is among the most confusing  symptoms for long haulers. Patients report being unusually forgetful, confused or unable to concentrate even enough to watch TV. This can happen to people who were in an intensive care unit for a while, but it's relatively rare. However, it is happening to a variety of patients, including those who weren't hospitalized.  Some people have reported feeling better for days or even weeks then relapsing. For others, it's a case of just not feeling like themselves.  Patients who are 21 days post-COVID diagnosis with the above symptoms should be considered "long haulers". [facility] Earlene Plater, 2021)  Scheduling Patients at the [facility] in [address] Ginette Otto.

## 2019-12-01 NOTE — Progress Notes (Signed)
Vaginal itching, slight dysuria/pelvic pain x 1 wk

## 2019-12-02 LAB — CBC WITH DIFFERENTIAL/PLATELET
Basophils Absolute: 0.1 10*3/uL (ref 0.0–0.2)
Basos: 1 %
EOS (ABSOLUTE): 0.1 10*3/uL (ref 0.0–0.4)
Eos: 1 %
Hematocrit: 38.8 % (ref 34.0–46.6)
Hemoglobin: 13 g/dL (ref 11.1–15.9)
Immature Grans (Abs): 0 10*3/uL (ref 0.0–0.1)
Immature Granulocytes: 1 %
Lymphocytes Absolute: 2 10*3/uL (ref 0.7–3.1)
Lymphs: 23 %
MCH: 30.2 pg (ref 26.6–33.0)
MCHC: 33.5 g/dL (ref 31.5–35.7)
MCV: 90 fL (ref 79–97)
Monocytes Absolute: 0.5 10*3/uL (ref 0.1–0.9)
Monocytes: 6 %
Neutrophils Absolute: 6 10*3/uL (ref 1.4–7.0)
Neutrophils: 68 %
Platelets: 326 10*3/uL (ref 150–450)
RBC: 4.3 x10E6/uL (ref 3.77–5.28)
RDW: 13.9 % (ref 11.7–15.4)
WBC: 8.7 10*3/uL (ref 3.4–10.8)

## 2019-12-02 LAB — COMP. METABOLIC PANEL (12)
AST: 16 IU/L (ref 0–40)
Albumin/Globulin Ratio: 1.6 (ref 1.2–2.2)
Albumin: 4.1 g/dL (ref 3.8–4.8)
Alkaline Phosphatase: 78 IU/L (ref 44–121)
BUN/Creatinine Ratio: 12 (ref 9–23)
BUN: 7 mg/dL (ref 6–24)
Bilirubin Total: 0.3 mg/dL (ref 0.0–1.2)
Calcium: 8.6 mg/dL — ABNORMAL LOW (ref 8.7–10.2)
Chloride: 105 mmol/L (ref 96–106)
Creatinine, Ser: 0.58 mg/dL (ref 0.57–1.00)
GFR calc Af Amer: 130 mL/min/{1.73_m2} (ref >59)
GFR calc non Af Amer: 112 mL/min/{1.73_m2} (ref >59)
Globulin, Total: 2.6 g/dL (ref 1.5–4.5)
Glucose: 94 mg/dL (ref 65–99)
Potassium: 3.9 mmol/L (ref 3.5–5.2)
Sodium: 139 mmol/L (ref 134–144)
Total Protein: 6.7 g/dL (ref 6.0–8.5)

## 2019-12-02 LAB — VITAMIN B12: Vitamin B-12: 796 pg/mL (ref 232–1245)

## 2019-12-02 LAB — MAGNESIUM: Magnesium: 2 mg/dL (ref 1.6–2.3)

## 2019-12-02 LAB — IRON,TIBC AND FERRITIN PANEL
Ferritin: 21 ng/mL (ref 15–150)
Iron Saturation: 8 % — CL (ref 15–55)
Iron: 28 ug/dL (ref 27–159)
Total Iron Binding Capacity: 330 ug/dL (ref 250–450)
UIBC: 302 ug/dL (ref 131–425)

## 2019-12-02 LAB — TSH: TSH: 1.55 u[IU]/mL (ref 0.450–4.500)

## 2019-12-02 LAB — D-DIMER, QUANTITATIVE: D-DIMER: 0.61 mg/L FEU — ABNORMAL HIGH (ref 0.00–0.49)

## 2019-12-17 NOTE — Progress Notes (Signed)
UTC letter generated and mailed

## 2020-04-26 ENCOUNTER — Ambulatory Visit (INDEPENDENT_AMBULATORY_CARE_PROVIDER_SITE_OTHER): Payer: Self-pay | Admitting: Family Medicine

## 2020-04-26 ENCOUNTER — Encounter: Payer: Self-pay | Admitting: Family Medicine

## 2020-04-26 ENCOUNTER — Other Ambulatory Visit: Payer: Self-pay

## 2020-04-26 VITALS — BP 132/90 | HR 92 | Ht 64.5 in | Wt 167.0 lb

## 2020-04-26 DIAGNOSIS — Z09 Encounter for follow-up examination after completed treatment for conditions other than malignant neoplasm: Secondary | ICD-10-CM

## 2020-04-26 DIAGNOSIS — Z789 Other specified health status: Secondary | ICD-10-CM

## 2020-04-26 DIAGNOSIS — N3000 Acute cystitis without hematuria: Secondary | ICD-10-CM

## 2020-04-26 DIAGNOSIS — N926 Irregular menstruation, unspecified: Secondary | ICD-10-CM

## 2020-04-26 DIAGNOSIS — R300 Dysuria: Secondary | ICD-10-CM

## 2020-04-26 DIAGNOSIS — Z758 Other problems related to medical facilities and other health care: Secondary | ICD-10-CM

## 2020-04-26 LAB — POCT URINALYSIS DIPSTICK
Glucose, UA: POSITIVE — AB
Ketones, UA: 15
Nitrite, UA: POSITIVE
Protein, UA: POSITIVE — AB
Spec Grav, UA: 1.015 (ref 1.010–1.025)
Urobilinogen, UA: >=8 E.U./dL — AB
pH, UA: 5 (ref 5.0–8.0)

## 2020-04-26 LAB — POCT URINE PREGNANCY: Preg Test, Ur: NEGATIVE

## 2020-04-26 MED ORDER — SULFAMETHOXAZOLE-TRIMETHOPRIM 800-160 MG PO TABS: 1.0000 | ORAL_TABLET | Freq: Two times a day (BID) | ORAL | 0 refills | Status: DC

## 2020-04-26 NOTE — Patient Instructions (Addendum)
https://www.womenshealth.gov/menopause/menopause-basics"> https://www.clinicalkey.com">    Menopausia Menopause La menopausia es el perodo normal de la vida de una mujer en el que los perodos menstruales cesan por completo. Marca el fin natural de la capacidad de [place] mujer de [place]. Se puede definir como la ausencia del perodo menstrual durante 12 meses sin otra causa mdica. La transicin a Secondary school teacher (perimenopausia) con mayor frecuencia ocurre entre los 45 y [place], y puede durar [employer]. Durante la perimenopausia, los niveles hormonales cambian en el [place], lo que puede provocar sntomas y Ship broker. La menopausia podra aumentar el riesgo de sufrir lo siguiente:  Huesos debilitados (osteoporosis), lo cual causa fracturas.  Depresin.  Endurecimiento y Scientist, research (medical) de las arterias (aterosclerosis), lo que puede causar infartos de miocardio y accidentes cerebrovasculares. Cules son las causas? A menudo, la causa de esta afeccin es un cambio natural en los niveles hormonales, que ocurre a medida que envejece. La menopausia tambin puede ser causada por cambios que no son naturales, entre ellos:  [place] para extirpar ambos ovarios (menopausia [place]).  Efectos secundarios de [employer], como la quimioterapia [place] para tratar el cncer (menopausia qumica). Qu incrementa el riesgo? Es ms probable que la menopausia comience a una edad ms temprana si tiene ciertas afecciones o se ha realizado ciertos tratamientos, incluidos los siguientes:  Un tumor en la hipfisis del cerebro.  Una enfermedad que afecte los ovarios y las hormonas.  Ciertos tratamientos para el cncer, como quimioterapia o terapia hormonal, o radioterapia en la pelvis.  Fumar mucho y consumir alcohol de forma excesiva.  Antecedentes familiares de menopausia temprana. Adems, es ms probable que esta afeccin se presente de manera temprana en las mujeres  que son [place]. Cules son los signos o sntomas? Los sntomas de esta afeccin incluyen:  Engineer, maintenance.  Perodos menstruales irregulares.  Sudoracin nocturna.  Cambios en el sentimiento respecto de las [employer]. Es posible que el deseo sexual disminuya o que se sienta ms incmoda respecto de su sexualidad.  Sequedad vaginal y adelgazamiento de las paredes de la vagina. Esto puede causar [employer].  Sequedad de la piel y aparicin de Banker.  Dolores de [place].  Problemas para dormir (insomnio).  Cambios de humor o irritabilidad.  Problemas de memoria.  Aumento de [place].  Crecimiento de vello en la cara y el pecho.  Infecciones en la vejiga o problemas para orinar. Cmo se diagnostica? Esta afeccin se diagnostica en funcin de los antecedentes mdicos, un examen fsico, la edad, los antecedentes menstruales y los sntomas. Tambin le podran realizar estudios hormonales. Cmo se trata? En algunos los casos, no se necesita tratamiento. Usted y el mdico deben decidir juntos si se debe Pensions consultant. El tratamiento se determinar en funcin de su cuadro clnico y de sus preferencias. El tratamiento de este cuadro clnico se centra en el control de los sntomas. El tratamiento puede incluir:  Terapia hormonal para la menopausia.  Medicamentos para tratar sntomas o complicaciones especficos.  Acupuntura.  Vitaminas o suplementos herbales. Antes de Microbiologist, es importante que le avise al mdico si tiene antecedentes personales o familiares de estas afecciones:  Enfermedad cardaca.  Cncer de mama.  Cogulos de [employer].  Diabetes.  Osteoporosis. Siga estas instrucciones en su casa: Estilo de vida  No consuma ningn producto que contenga nicotina o tabaco, como cigarrillos, cigarrillos electrnicos y tabaco de Theatre manager. Si necesita ayuda para dejar de consumir estos productos, consulte al  [employer].  Realice, por lo menos,  de actividad fsica 5das por semana o ms.  Evite las bebidas con alcohol o cafena, as como las [employer]. Esto podra ayudar a prevenir los acaloramientos.  Intente dormir de 7 a 8horas todas las noches.  Si tiene acaloramientos: ? Vstase en capas. ? Evite las cosas que podran [employer] acaloramientos, como las comidas muy condimentadas, los lugares calientes o el estrs. ? Respire profundamente y despacio cuando comience a Scientist, water quality. ? Tenga un ventilador en su casa y en su oficina.  Encuentre modos de [employer], por ejemplo, a travs de la respiracin, meditacin o un diario ntimo.  Considere la posibilidad de asistir a terapia grupal con otras mujeres que tengan sntomas de [place]. [place] recomendaciones al [employer] reuniones de terapia grupal. Comida y bebida  Siga una dieta saludable y equilibrada que incluya cereales integrales, protenas magras, productos lcteos descremados y [place] frutas y verduras.  El mdico podra recomendarle que agregue una mayor cantidad de soja a su dieta. Algunos de los alimentos que contienen soja son el tofu, el tempeh y la [place] de soja.  Consuma muchos alimentos que contengan calcio y vitaminaD para Nutritional therapist salud sea. Algunos productos que contienen mucho calcio son los [employer], el yogur, los frijoles, las [place], las sardinas, el brcoli y la col rizada.   Medicamentos  Use los medicamentos de venta libre y los recetados solamente como se lo haya indicado el mdico.  Hable con el mdico antes de Corporate investment banker a tomar cualquier suplemento herbal. Tome las vitaminas y los suplementos recetados como se lo haya indicado el mdico. Indicaciones generales  Lleve un registro de sus perodos menstruales; incluya lo siguiente: ? El momento en que ocurren. ? Qu tan abundantes son y cunto duran. ? Cunto tiempo transcurre entre cada  perodo menstrual.  Lleve un registro de los sntomas; anote cundo comienzan, con qu frecuencia ocurren y cunto duran.  Use lubricantes o humectantes vaginales para aliviar la sequedad vaginal y Personnel officer durante las relaciones sexuales.  Cumpla con todas las visitas de seguimiento. Esto es importante. Esto incluye la terapia grupal y la psicoterapia.   Comunquese con un mdico si:  An tiene perodos menstruales despus de los 55aos.  Siente dolor durante las [employer].  No tuvo perodos menstruales durante los ltimos y presenta sangrado vaginal. Solicite ayuda de inmediato si tiene:  Depresin grave.  Sangrado vaginal excesivo.  Dolor al Beatrix Shipper.  Latidos cardacos rpidos o irregulares (palpitaciones).  Dolor de cabeza intenso.  Dolor en el abdomen o indigestin grave. Resumen  La menopausia es un perodo normal de la vida de Nurse, mental health en el que los perodos menstruales cesan por completo. Se define normalmente como la ausencia del periodo menstrual durante 12 meses sin otra causa mdica.  La transicin a Secondary school teacher (perimenopausia) con mayor frecuencia ocurre entre los 45 y [place], y puede durar varios aos.  Los sntomas pueden controlarse mediante medicamentos, cambios en el estilo de vida y terapias complementarias, como acupuntura.  Siga una dieta equilibrada que incluya muchos nutrientes para Biochemist, clinical salud sea y la salud cardaca, y para Chief Operating Officer los sntomas durante la [place]. Esta informacin no tiene Theme park manager el consejo del mdico. Asegrese de hacerle al mdico cualquier pregunta que tenga. Document Revised: 08/05/2019 Document Reviewed: 08/05/2019 Elsevier Patient Education  2021 Elsevier Inc. Infeccin urinaria en los adultos Urinary Tract Infection, Adult Una infeccin urinaria (IU) puede ocurrir en Corporate treasurer de las vas urinarias.  Las vas urinarias incluyen lo siguiente:  Los  riones.  Los urteres.  La vejiga.  La uretra. Estos rganos fabrican, almacenan y eliminan el pis (orina) del cuerpo. Cules son las causas? La causa de esta infeccin es la presencia de grmenes (bacterias) en la zona genital. Estos grmenes proliferan y causan hinchazn (inflamacin) de las vas urinarias. Qu incrementa el riesgo? Los siguientes factores pueden hacer que sea ms propensa a Clinical cytogeneticist afeccin:  Usar un tubo delgado y pequeo (catter) para drenar el pis.  Imposibilidad de Sales executive momento de orinar o defecar (incontinencia).  Ser mujer. Si usted [employer] Corporation, estas cosas pueden aumentar el riesgo: ? Usar estos mtodos para evitar un embarazo:  Un medicamento que [employer] espermatozoides (espermicida).  Un dispositivo que impide el paso de los espermatozoides (diafragma). ? Tenerniveles bajos de una hormona femenina (estrgeno). ? Estar embarazada. Es ms probable que sufra esta afeccin si:  Tiene genes que [employer].  Es sexualmente activa.  Toma antibiticos.  Tiene dificultad para orinar debido a: ? Su prstata es ms grande de lo normal, si usted es hombre. ? Obstruccin en la parte del cuerpo que drena el pis de la vejiga. ? Clculo renal. ? Un trastorno nervioso que afecta la vejiga. ? No bebe una cantidad suficiente de lquido. ? No hace pis con la frecuencia suficiente.  Tiene otras afecciones, como: ? Diabetes. ? Un sistema que combate las enfermedades (sistema inmunitario) debilitado. ? Anemia drepanoctica. ? Gota. ? Lesin en la columna vertebral. Cules son los signos o sntomas? Los sntomas de esta afeccin incluyen:  Necesidad inmediata de hacer pis.  Hacer poca cantidad de pis con mucha frecuencia.  Dolor o ardor al [employer].  Sangre en el pis.  Pis que huele mal o anormal.  Dificultad para hacer pis.  Pis turbio.  Lquido que sale de la vagina, si es [place].  Dolor en la barriga o en la parte  baja de la espalda. Otros sntomas pueden incluir los siguientes:  Vmitos.  Falta de apetito.  Sentirse confundido (confuso). Este puede ser Financial risk analyst sntoma en los adultos [place].  Sentirse cansado y malhumorado (irritable).  Grant Ruts.  Materia fecal lquida (diarrea). Cmo se trata?  Recibir antibiticos.  Recibir otros medicamentos.  Beber una cantidad suficiente de agua. En algunos casos, es posible que deba consultar a Music therapist. Siga estas instrucciones en su casa: Medicamentos  Use los medicamentos de venta libre y los recetados solamente como se lo haya indicado el mdico.  Si le recetaron un antibitico, tmelo como se lo haya indicado el mdico. No deje de tomarlo aunque comience a sentirse mejor. Instrucciones generales  Asegrese de hacer lo siguiente: ? Haga pis hasta que la vejiga quede vaca. ? Nocontenga el pis durante mucho tiempo. ? Vaciar la vejiga despus de Doctor, hospital. ? Lmpiese de adelante hacia atrs despus de hacer pis o defecar, si es mujer. Use cada trozo de papel higinico solo una vez cuando se limpie.  Beba suficiente lquido como para Pharmacologist la orina de color amarillo plido.  Cumpla con todas las visitas de seguimiento.   Comunquese con un mdico si:  No mejora despus de 1 o 2das de tratamiento.  Los sntomas desaparecen y Stage manager. Solicite ayuda de inmediato si:  Tiene un dolor muy intenso en la espalda.  Tiene dolor muy intenso en la parte baja de la barriga.  Tiene fiebre.  Tiene escalofros.  Se siente como si fuera a vomitar o  vomita. Resumen  Una infeccin urinaria (IU) puede ocurrir en Corporate treasurer de las vas [place].  Esta afeccin es causada por la presencia de grmenes en la zona genital.  Existen muchos factores de riesgo de sufrir una IU.  El tratamiento incluye antibiticos.  Beba suficiente lquido como para Pharmacologist la orina de color amarillo plido. Esta informacin no  tiene Theme park manager el consejo del mdico. Asegrese de hacerle al mdico cualquier pregunta que tenga. Document Revised: 11/14/2019 Document Reviewed: 11/14/2019 Elsevier Patient Education  2021 Elsevier Inc. Sulfamethoxazole; Trimethoprim, SMX-TMP tablets Qu es este medicamento? SULFAMETOXAZOL; TRIMETOPRIMA o SMX-TMP es una combinacin de un antibitico del grupo de las sulfonamidas y un segundo antibitico, trimetoprima. Se utiliza para tratar o prevenir ciertos tipos de infecciones bacterianas. No es efectivo para resfros, gripe u otras infecciones de origen viral. Este medicamento puede ser utilizado para otros usos; si tiene alguna pregunta consulte con su proveedor de atencin mdica o con su farmacutico. MARCAS COMUNES: Bacter-Aid DS, Bactrim, Bactrim DS, Septra, Septra DS Qu le debo informar a mi profesional de la salud antes de tomar este medicamento? Necesitan saber si usted presenta alguno de los [employer] o situaciones: deficiencia de G6PD VIH o SIDA enfermedad renal enfermedad heptica cantidad baja de plaquetas cantidad baja de glbulos rojos desnutricin problemas estomacales o intestinales, tales como colitis enfermedad tiroidea una reaccin alrgica o inusual al sulfametoxasol, a la trimetoprima, a las sulfonamidas, a otros frmacos, alimentos, colorantes o conservantes si est embarazada o buscando quedar embarazada si est amamantando a un beb Cmo debo utilizar este medicamento? Tome este medicamento por va oral con un vaso de agua. Siga las instrucciones de la etiqueta del [place]. Use su medicamento a intervalos regulares. No lo use con una frecuencia mayor a la indicada. Complete todas las dosis de su medicamento como se le haya indicado, aun si se siente mejor. No omita ninguna dosis ni suspenda el uso de su medicamento antes de lo indicado. Hable con su pediatra para informarse acerca del uso de este medicamento en nios. Aunque este medicamento  se puede recetar a nios tan pequeos como de 2 meses de edad con ciertas afecciones, existen precauciones que deben tomarse. Sobredosis: Pngase en contacto inmediatamente con un centro toxicolgico o una sala de urgencia si usted cree que haya tomado demasiado medicamento. ATENCIN: [employer] es solo para usted. No comparta este medicamento con nadie. Qu sucede si me olvido de una dosis? Si olvida una dosis, adminstrela lo antes posible. Si es casi la hora de la prxima dosis, administre solo esa dosis. No se administre dosis adicionales o dobles. Qu puede interactuar con este medicamento? No use este medicamento con ninguno de los siguientes frmacos: dofetilida Este medicamento tambin puede interactuar con los siguientes frmacos: amantadina pldoras anticonceptivas ciertos medicamentos para la presin sangunea o enfermedad cardiaca ciertos medicamentos para la depresin, tales como amitriptilina ciertos medicamentos para la diabetes, tales como glipizida o gliburida ciertos medicamentos que tratan o previenen cogulos sanguneos, como warfarina ciclosporina digoxina diurticos indometacina metotrexato fenitona procainamida pirimetamina zidovudina Puede ser que esta lista no menciona todas las posibles interacciones. Informe a su profesional de [employer] de [employer] productos a base de hierbas, medicamentos de [place] o suplementos nutritivos que est tomando. Si usted fuma, consume bebidas alcohlicas o si utiliza drogas ilegales, indqueselo tambin a su profesional de [employer]. Algunas sustancias pueden interactuar con su medicamento. A qu debo estar atento al usar [employer]? Informe a su proveedor de Psychologist, prison and probation services  si sus sntomas no comienzan a mejorar o si empeoran. No trate la diarrea con productos de [employer]. Contacte a su proveedor de atencin mdica si tiene diarrea por ms de [address]  [address], o si es grave y [place]. Este frmaco puede causar reacciones graves en  la piel. Pueden suceder semanas a meses despus de comenzar a Actuary. Contacte a su proveedor de atencin mdica de inmediato si nota que tiene fiebre o sntomas gripales con una erupcin. La erupcin puede ser roja o Clarisa Fling, y luego puede convertirse en ampollas o descamacin de la piel. O bien, es posible que observe una erupcin roja con hinchazn en la cara, los labios o los ganglios linfticos en el cuello o debajo de los brazos. Este frmaco puede aumentar su sensibilidad al sol. Evite la [employer]. Si no la Network engineer, utilice ropa protectora y crema de Orthoptist. No utilice lmparas solares, camas solares ni cabinas solares. Proceda con cuidado al cepillar sus dientes, usar hilo dental o Chemical engineer palillos para los dientes, ya que podra contraer una infeccin o Geophysicist/field seismologist con mayor facilidad. Si se somete a algn tratamiento dental, informe a su dentista que est usando este frmaco. Qu efectos secundarios puedo tener al [employer] este medicamento? Efectos secundarios que debe informar a su mdico o a Producer, television/film/video de la salud tan pronto como sea posible: Therapist, art (erupcin cutnea, comezn/picazn o urticaria; hinchazn de la cara, los labios o la lengua) diarrea con sangre o acuosa tos cambios en el ritmo cardiaco (problemas para respirar; Journalist, newspaper; mareos; frecuencia cardiaca irregular, rpida; sensacin de desmayo o aturdimiento, cadas) fiebre niveles elevados de potasio (dolor en el pecho; frecuencia cardiaca rpida, irregular; debilidad muscular) lesin renal (dificultad para orinar o cambios en la cantidad de orina) lesin en el hgado (orina amarilla oscura o [place]; sensacin general de estar enfermo o sntomas gripales; prdida del apetito; dolor en la regin abdominal superior derecha; debilidad o cansancio inusuales; color amarillento de los ojos o la piel) presin sangunea baja (mareos; sensacin de desmayo o aturdimiento; cadas; debilidad o  cansancio inusuales) niveles bajos de azcar en la sangre (ansiedad; confusin; mareos; aumento del apetito; debilidad o cansancio inusuales; aumento de la sudoracin; temblores; piel fra y sudorosa; irritabilidad; Engineer, mining de cabeza; visin borrosa; frecuencia cardiaca rpida; prdida del conocimiento) recuentos bajos de glbulos rojos (dificultad para respirar; sensacin de desmayo; aturdimiento; cadas; debilidad o cansancio inusuales) erupcin, fiebre y ganglios linfticos inflamados enrojecimiento, formacin de Chartered loss adjuster, descamacin o distensin de la piel, incluso dentro de la boca problemas para respirar sangrado o moretones inusuales Efectos secundarios que generalmente no requieren atencin mdica (infrmelos a su mdico o a su profesional de la salud si persisten o si son molestos): falta o prdida del apetito nuseas vmito Puede ser que esta lista no menciona todos los posibles efectos secundarios. Comunquese a su mdico por asesoramiento mdico [employer]. Usted puede informar los efectos secundarios a la FDA por telfono al 1-800-FDA-1088. Dnde debo guardar mi medicina? Mantenga fuera del alcance de los nios. Guarde a una temperatura de entre 15 y 25 grados Celsius (59 a 77 grados Fahrenheit). Proteja de Statistician. Mantenga el recipiente bien cerrado. Deseche todo el frmaco que no haya utilizado despus de la fecha de vencimiento. ATENCIN: Este folleto es un resumen. Puede ser que no cubra toda la posible informacin. Si usted tiene preguntas acerca de esta medicina, consulte con su mdico, su farmacutico o su profesional de Radiographer, therapeutic.  2021 Elsevier/Gold  Standard (2019-07-08 00:00:00)

## 2020-04-26 NOTE — Progress Notes (Signed)
[facility]  Sick Visit  Subjective:  Patient ID: Teresa Orozco, female    DOB: 12-17-1975  Age: 45 y.o. MRN: [id number]  CC:  Chief Complaint  Patient presents with  . Abdominal Pain    HPI Teresa Orozco is a 45 year old female who presents for Sick Visit today.    Patient Active Problem List   Diagnosis Date Noted  . Upper back pain on right side 11/06/2019  . Muscle spasm 11/06/2019  . History of COVID-19 11/06/2019  . Pneumonia due to COVID-19 virus 09/09/2019  . UTI (urinary tract infection) 09/09/2019  . Severe sepsis (HCC) 09/09/2019  . Hypokalemia 09/09/2019  . Goiter 09/09/2019  . Well woman exam with routine gynecological exam 08/27/2018  . ASCUS with positive high risk HPV cervical 10/02/2017  . Grand multipara 04/23/2017  . Language barrier 04/23/2017  . Advanced maternal age in multigravida >40 07/26/2015   Current Status: This will be Ms. Teresa Orozco's initial office visit with me. She is currently seeing Thad Ranger, NP for her PCP needs. Since her last office visit, she has c/o of dysuria and lower abdominal/back pain/discomfort, and itching X 1 week now. She has been taking OTC cream which has not been effective. She denies urinary frequency, discharge, dysuria, urinary burning, odor, and hematuria. She denies fevers, chills, fatigue, recent infections, weight loss, and night sweats. She has not had any headaches, visual changes, dizziness, and falls. No chest pain, heart palpitations, cough and shortness of breath reported. No reports of GI problems such as nausea, vomiting, diarrhea, and constipation. She has no reports of blood in stools, and hematuria.No depression or anxiety reported today. She is taking all medications as prescribed. She denies pain today. We used video Hispanic Interpreter today.   Past Medical History:  Diagnosis Date  . Medical history non-contributory     Past Surgical History:  Procedure  Laterality Date  . DILATION AND CURETTAGE OF UTERUS  2008    No family history on file.  Social History   Socioeconomic History  . Marital status: Married    Spouse name: Not on file  . Number of children: 6  . Years of education: Not on file  . Highest education level: 6th grade  Occupational History  . Not on file  Tobacco Use  . Smoking status: Never Smoker  . Smokeless tobacco: Never Used  Vaping Use  . Vaping Use: Never used  Substance and Sexual Activity  . Alcohol use: No  . Drug use: No  . Sexual activity: Yes    Birth control/protection: None  Other Topics Concern  . Not on file  Social History Narrative  . Not on file   Social Determinants of Health   Financial Resource Strain: Not on file  Food Insecurity: Not on file  Transportation Needs: Not on file  Physical Activity: Not on file  Stress: Not on file  Social Connections: Not on file  Intimate Partner Violence: Not on file    Outpatient Medications Prior to Visit  Medication Sig Dispense Refill  . acetaminophen (TYLENOL) 325 MG tablet Take 650 mg by mouth every 6 (six) hours as needed for fever.    . Prenatal Vit-Fe Fumarate-FA (PRENATABS FA) 29-1 MG TABS Take 1 tablet by mouth daily. 30 tablet 5  . tiZANidine (ZANAFLEX) 4 MG tablet Take 1 tablet (4 mg total) by mouth every 6 (six) hours as needed for muscle spasms. 30 tablet 0  No facility-administered medications prior to visit.    No Known Allergies  ROS Review of Systems  Constitutional: Negative.   Eyes: Negative.   Respiratory: Negative.   Cardiovascular: Negative.   Gastrointestinal: Negative.   Musculoskeletal: Negative.   Psychiatric/Behavioral: Negative.       Objective:    Physical Exam Vitals and nursing note reviewed.  Constitutional:      Appearance: She is well-developed.  Cardiovascular:     Rate and Rhythm: Normal rate and regular rhythm.     Pulses: Normal pulses.     Heart sounds: Normal heart sounds.   Pulmonary:     Effort: Pulmonary effort is normal.     Breath sounds: Normal breath sounds.  Neurological:     Mental Status: She is alert.    BP 132/90   Pulse 92   Ht 5' 4.5" (1.638 m)   Wt 167 lb (75.8 kg)   LMP 01/19/2020   SpO2 99%   BMI 28.22 kg/m  Wt Readings from Last 3 Encounters:  04/26/20 167 lb (75.8 kg)  12/01/19 172 lb 9.6 oz (78.3 kg)  11/06/19 169 lb (76.7 kg)    Health Maintenance Due  Topic Date Due  . COLONOSCOPY (Pts 45-57yrs Insurance coverage will need to be confirmed)  Never done    There are no preventive care reminders to display for this patient.  Lab Results  Component Value Date   TSH 1.550 12/01/2019   Lab Results  Component Value Date   WBC 8.7 12/01/2019   HGB 13.0 12/01/2019   HCT 38.8 12/01/2019   MCV 90 12/01/2019   PLT 326 12/01/2019   Lab Results  Component Value Date   NA 139 12/01/2019   K 3.9 12/01/2019   CO2 22 11/06/2019   GLUCOSE 94 12/01/2019   BUN 7 12/01/2019   CREATININE 0.58 12/01/2019   BILITOT 0.3 12/01/2019   ALKPHOS 78 12/01/2019   AST 16 12/01/2019   ALT 17 11/06/2019   PROT 6.7 12/01/2019   ALBUMIN 4.1 12/01/2019   CALCIUM 8.6 (L) 12/01/2019   ANIONGAP 10 09/13/2019   Lab Results  Component Value Date   CHOL 192 09/04/2018   Lab Results  Component Value Date   HDL 46 09/04/2018   Lab Results  Component Value Date   LDLCALC 107 (H) 09/04/2018   Lab Results  Component Value Date   TRIG 25 09/09/2019   No results found for: [facility] Lab Results  Component Value Date   HGBA1C 5.3 12/01/2019   Assessment & Plan:   1. Language barrier  2. Acute cystitis without hematuria - sulfamethoxazole-trimethoprim (BACTRIM DS) 800-160 MG tablet; Take 1 tablet by mouth 2 (two) times daily.  Dispense: 14 tablet; Refill: 0 - Urine Culture  3. Dysuria - POCT urinalysis dipstick  4. Missed period - POCT urine pregnancy  5. Follow up She will follow up in 3 months.    Meds ordered this  encounter  Medications  . sulfamethoxazole-trimethoprim (BACTRIM DS) 800-160 MG tablet    Sig: Take 1 tablet by mouth 2 (two) times daily.    Dispense:  14 tablet    Refill:  0    Orders Placed This Encounter  Procedures  . Urine Culture  . POCT urinalysis dipstick  . POCT urine pregnancy    Referral Orders  No referral(s) requested today    Raliegh Ip, MSN, ANE, FNP-BC [facility] [address] [place]  [place], [place] [address] [phone] fax   Problem List Items Addressed This Visit      Genitourinary   UTI (urinary tract infection)   Relevant Medications   sulfamethoxazole-trimethoprim (BACTRIM DS) 800-160 MG tablet   Other Relevant Orders   Urine Culture     Other   Language barrier - Primary    Other Visit Diagnoses    Dysuria       Relevant Orders   POCT urinalysis dipstick (Completed)   Missed period       Relevant Orders   POCT urine pregnancy (Completed)   Follow up          Meds ordered this encounter  Medications  . sulfamethoxazole-trimethoprim (BACTRIM DS) 800-160 MG tablet    Sig: Take 1 tablet by mouth 2 (two) times daily.    Dispense:  14 tablet    Refill:  0    Follow-up: No follow-ups on file.    Kallie Locks, FNP

## 2020-07-26 ENCOUNTER — Encounter: Payer: Self-pay | Admitting: Nurse Practitioner

## 2020-07-26 ENCOUNTER — Ambulatory Visit (INDEPENDENT_AMBULATORY_CARE_PROVIDER_SITE_OTHER): Payer: Self-pay | Admitting: Nurse Practitioner

## 2020-07-26 ENCOUNTER — Other Ambulatory Visit: Payer: Self-pay

## 2020-07-26 VITALS — BP 120/79 | HR 74 | Temp 98.4°F | Ht 64.5 in | Wt 179.0 lb

## 2020-07-26 DIAGNOSIS — N926 Irregular menstruation, unspecified: Secondary | ICD-10-CM

## 2020-07-26 DIAGNOSIS — R0602 Shortness of breath: Secondary | ICD-10-CM

## 2020-07-26 DIAGNOSIS — R82998 Other abnormal findings in urine: Secondary | ICD-10-CM

## 2020-07-26 DIAGNOSIS — F329 Major depressive disorder, single episode, unspecified: Secondary | ICD-10-CM

## 2020-07-26 DIAGNOSIS — R002 Palpitations: Secondary | ICD-10-CM

## 2020-07-26 DIAGNOSIS — F419 Anxiety disorder, unspecified: Secondary | ICD-10-CM

## 2020-07-26 LAB — POCT URINALYSIS DIPSTICK
Bilirubin, UA: NEGATIVE
Glucose, UA: NEGATIVE
Nitrite, UA: NEGATIVE
Protein, UA: POSITIVE — AB
Spec Grav, UA: 1.025 (ref 1.010–1.025)
Urobilinogen, UA: 4 E.U./dL — AB
pH, UA: 7 (ref 5.0–8.0)

## 2020-07-26 LAB — POCT URINE PREGNANCY: Preg Test, Ur: NEGATIVE

## 2020-07-26 MED ORDER — ALBUTEROL SULFATE HFA 108 (90 BASE) MCG/ACT IN AERS: 2.0000 | INHALATION_SPRAY | Freq: Four times a day (QID) | RESPIRATORY_TRACT | 2 refills | Status: DC | PRN

## 2020-07-26 MED ORDER — CITALOPRAM HYDROBROMIDE 10 MG PO TABS: 10.0000 mg | ORAL_TABLET | Freq: Every day | ORAL | 3 refills | Status: AC

## 2020-07-26 NOTE — Patient Instructions (Addendum)
[email or web address].Gov">  Trastorno de ansiedad generalizada, en adultos Generalized Anxiety Disorder, Adult El trastorno de ansiedad generalizada (TAG) es una afeccin de salud mental. A diferencia de las preocupaciones normales, la ansiedad relacionada con el TAG no se desencadena por un acontecimiento especfico. Estas preocupaciones no desaparecen ni mejoran con el tiempo. EL TAG interfiere con las relaciones, eltrabajo y [place]. Los sntomas del TAG pueden variar de leves a graves. Las personas con TAG grave pueden tener intensas oleadas de ansiedad con sntomas fsicos que sonsimilares a las crisis de [place]. Cules son las causas? Se desconoce la causa exacta del TAG, pero se cree que influyen los siguientes factores: Diferencias en las sustancias qumicas naturales del cerebro. Genes que se transmiten de padres a hijos. Diferencias en la forma en que se perciben las amenazas. El desarrollo durante la infancia. La personalidad. Qu incrementa el riesgo? Los siguientes factores pueden hacer que sea ms propenso a Clinical cytogeneticist afeccin: Ser mujer. Tener antecedentes familiares de trastornos de ansiedad. Ser muy tmido. Experimentar acontecimientos muy estresantes en la vida, como la muerte de un ser querido. Tener un entorno Energy manager. Cules son los signos o sntomas? Con frecuencia, las personas que padecen el TAG se preocupan excesivamente por muchas cosas en la vida, tales como su salud y [place]. Otros sntomas son: Management consultant y emocionales: Preocupacin excesiva acerca de los desastres naturales. [place] a llegar tarde. Dificultad para concentrarse. Temor de que otras personas juzguen su desempeo. Sntomas fsicos: Fatiga. Dolores de cabeza, tensin muscular, contracciones musculares, temblores o sensacin de tambalearse. Sensacin de que el corazn late [place] rpido. Sentir falta de aire o como que no se puede respirar profundamente. Problemas  para conciliar el sueo o para seguir durmiendo, o sensacin de Designer, jewellery. Sudoracin. Nuseas, diarrea o sndrome del intestino irritable (SII). Sntomas de la conducta: Tener estados de nimo cambiantes o irritabilidad. Evitar situaciones nuevas. Evitar a Magazine features editor. Dificultad extrema para tomar decisiones. Cmo se diagnostica? Esta afeccin se diagnostica en funcin de los sntomas y los antecedentes mdicos. Adems, se le realizar un examen fsico. El mdico puede hacerlealgunas pruebas para descartar que sus sntomas tengan otras causas. Para recibir un diagnstico del TAG, una persona debe tener ansiedad que: Est fuera de su control. Afecte distintos aspectos de su vida, como el trabajo y [employer]. Cause angustia que le impida participar en sus actividades habituales. Incluya al menos tres sntomas del TAG, tales como desasosiego, fatiga, dificultad para concentrarse, irritabilidad, tensin muscular o problemas para dormir. Antes de que su mdico pueda confirmar el diagnstico de TAG, estos sntomas deben estar presentes ms [employer] no lo estn y deben tener unaduracin de seis meses o ms. Cmo se trata? El tratamiento de esta afeccin puede incluir: Medicamentos. Por lo general, los medicamentos antidepresivos se recetan para un control diario a Air cabin crew. Se pueden agregar medicamentos para la ansiedad en casos graves, especialmente cuando ocurren crisis de [place]. Psicoterapia (psicoanlisis). Determinados tipos de psicoterapia pueden ser tiles para tratar el TAG al brindar apoyo, educacin y orientacin. Entre las opciones se incluyen las siguientes: Terapia cognitivo conductual (TCC). Las personas aprenden habilidades para afrontar situaciones y tcnicas para poder calmarse para aliviar sus sntomas fsicos. Aprenden a identificar comportamientos y pensamientos no realistas y a reemplazarlos por comportamientos y pensamientos ms adecuados. Terapia de  aceptacin y compromiso (acceptance and commitment therapy, ACT). Este tratamiento ensea a las personas a ser conscientes como una forma de lidiar con pensamientos y sentimientos no deseados.  Biorretroalimentacin. Este proceso lo capacita para Scientist, physiologicalcontrolar la respuesta del cuerpo Publishing copy(respuesta psicolgica) a travs de tcnicas de respiracin y mtodos de relajacin. Usted trabajar con un terapeuta mientras se usan mquinas para controlar sus sntomas fsicos. Tcnicas para Charity fundraisercontrolar el estrs. Estas incluyen yoga, meditacin y ejercicio. Un especialista en salud mental puede ayudar a determinar qu tratamiento es el mejor para usted. Algunas Naval architectpersonas pueden mejorar con solo un tipo de Penalosaterapia.Sin embargo, Economistotras personas requieren una combinacin de terapias. Siga estas instrucciones en su casa: Estilo de vida Mantenga horarios y [place]a rutina uniforme. Prevea las situaciones estresantes. Stevan BornElabore un plan y reserve tiempo extra para trabajar con su plan. Practique tcnicas de control del estrs o tcnicas para calmarse que su terapeuta o su mdico le haya enseado. Instrucciones generales Baxter Internationalome los medicamentos de venta libre y los recetados solamente como se lo haya indicado el mdico. Comprenda que es probable que tenga retrocesos. Acepte esto y sea amable con usted mismo mientras contina cuidndose mejor. Reconozca y acepte sus logros, aunque le parezcan pequeos. Concurra a todas las visitas de [address]seguimiento como se lo haya indicado el mdico. Esto es importante. Comunquese con un mdico si: Los sntomas no mejoran. Sus sntomas empeoran. Tiene signos de depresin tales como: Un estado de nimo constantemente triste o irritable. Ya no disfruta de Industrial/product designeractividades que le solan causar placer. Cambios en el peso o en sus hbitos de alimentacin. Cambios en los hbitos de sueo. Evita a amigos y familiares. No tiene energa para realizar las tareas habituales. Tiene sentimientos de culpa o de  inutilidad. Solicite ayuda de inmediato si: Tiene pensamientos serios acerca de lastimarse a usted mismo o a Economistotras personas. Si alguna vez siente que puede lastimarse o Physicist, medicallastimar a Economistotras personas, o tiene pensamientos de poner fin a su vida, busque ayuda de inmediato. Dirjase al servicio de urgencias ms cercano o: Comunquese con el servicio de emergencias de su localidad (911 en los [place]). Llame a una lnea de asistencia al suicida y atencin en crisis como National Suicide Prevention Lifeline (Lnea Nacional de Prevencin del Suicidio) al [phone]. Est disponible las 24 horas del da en los [place]. [place]. Enve un mensaje de texto a la lnea para casos de crisis al [phone] (en los EE. UU.). Resumen El trastorno de ansiedad generalizada (TAG) es una afeccin de salud mental que implica preocupacin no desencadenada por un acontecimiento especfico. Con frecuencia, las personas que padecen el TAG se preocupan excesivamente por muchas cosas en la vida, tales como su salud y [place] familia. El TAG puede causar sntomas tales como agitacin, dificultad para concentrarse, problemas para dormir, sudoracin frecuente, nuseas, diarrea, dolores de Turkmenistancabeza y temblores o contracciones musculares. Un especialista en salud mental puede ayudar a determinar qu tratamiento es el mejor para usted. Algunas Naval architectpersonas pueden mejorar con solo un tipo de [place]ab[place]terapia. Sin embargo, Economistotras personas requieren una combinacin de terapias. Esta informacin no tiene Theme park managercomo fin reemplazar el consejo del mdico. Asegresede hacerle al mdico cualquier pregunta que tenga. http://APA.org/depression-guideline"> https://clinicalkey.com"> http://point-of-care.elsevierperformancemanager.com/skills/"> http://point-of-care.elsevierperformancemanager.com">  Cmo sobrellevar la depresin en los adultos Managing Depression, Adult La depresin es una afeccin de salud mental que puede influir en los pensamientos, los sentimientos y Havilandlas  conductas. Recibir el diagnstico de depresin puede traerle alivio si no saba por qu se senta o se comportaba de Chiropodistuna manera determinada. Tambin podra hacerlo sentir abrumado por la incertidumbre acerca de su futuro. Prepararse para sobrellevar los sntomaspuede ayudarle a sentirse ms positivo sobre el futuro. Cmo manejar los Baker Hughes Incorporatedcambios  en el estilo de vida Control del estrs  El estrs es la reaccin del cuerpo ante los cambios y los acontecimientos de la vida, tanto buenos [place]. El estrs puede aumentar los sentimientos de depresin. Aprender a controlar el estrs puede ayudar a disminuir lossentimientos de depresin. Pruebe algunos de los siguientes enfoques para reducir Development worker, community (tcnicas de reduccin del estrs): Escuche msica que le guste y que lo inspire. Intente usar una aplicacin de meditacin o tome una clase de meditacin. Desarrolle una prctica que le ayude a conectarse con su ser espiritual. Camine al [employer], rece o vaya a un lugar de culto. Respire profundo. Para hacerlo, inhale lentamente por la nariz. Haga una pausa cuando alcance el mximo de la inhalacin durante unos segundos y luego exhale lentamente, dejando que los msculos se relajen. Practique yoga para relajarse y trabajar con los msculos. Elija una tcnica para reducir el estrs que se adapte a su estilo de vida y su personalidad. Desarrollar estas tcnicas lleva tiempo y prctica. Resrvate de 5 a 15 minutos por da para hacerlas. Algunos terapeutas pueden ofrecerle capacitacin para aprenderlas. Otras cosas que puede hacer para manejar el estrs: Midwife un registro del estrs. Conocer sus lmites y Designer, jewellery que no cuando piensa que algo es [place]. Prestar atencin a cmo reacciona ante ciertas situaciones. Es posible que no pueda controlar todo, pero s puede controlar su reaccin. Aadir humor a su vida viendo comedias o programas cmicos de TV. Hacerse tiempo para las actividades que disfruta y que lo  Banker.  Medicamentos Los medicamentos, como los antidepresivos, suelen ser parte del tratamiento para la depresin. Hable con su farmacutico o mdico sobre [employer] Mutual [employer] Omaha, suplementos y productos a base de hierbas que toma, sus posibles efectos secundarios, y qu medicamentos y otros productos que se pueden tomar al mismo tiempo sin correr Herbalist. Es importante que informe a su mdico si tiene efectos secundarios. Las Science Applications International mdico puede sugerirle terapia familiar, terapia de pareja o terapiaindividual como parte del tratamiento. Cmo reconocer los [employer] persona tiene una respuesta distinta al tratamiento para la depresin. A medida que se recupere de la depresin, puede comenzar a hacer lo siguiente: Tener ms inters en hacer sus actividades. Sentirse menos desesperanzado. Tener ms energa. Comer en exceso con menos frecuencia o tener un mejor apetito. Tener mejor atencin mental. Es importante reconocer si la depresin no mejora o si empeora. Los sntomas que tuvo en el comienzo pueden volver, por ejemplo: Cansancio (fatiga) o poca energa. Comer demasiado o muy poco. Dormir demasiado o muy poco. Sentirse agotado, agitado o desesperanzado. Dificultad para concentrarse o para tomar decisiones. Dolencias fsicas que no tienen motivo. Sentirse irritable, enojado o agresivo. Si usted o sus familiares advierten que estos sntomas regresan, infrmeselo almdico de inmediato. Siga estas instrucciones en su casa: Actividad  Intente realizar alguna forma de ejercicio todos los [place], como caminar, Lobbyist, nadar o levantar pesas. Practique tcnicas de reduccin del estrs. Active la mente tomando clases o realizando algn trabajo voluntario.  Estilo de vida [place] bien y por el [place]. No consuma ms caf, tabaco, alcohol ni otras sustancias potencialmente peligrosas. Siga una dieta saludable que incluya abundantes frutas, verduras, cereales  integrales, productos lcteos descremados y protenas magras. No consuma muchos alimentos ricos en grasas slidas, azcares agregados o sal (sodio). Instrucciones generales Use los medicamentos de venta libre y los recetados solamente como se lo haya indicado el mdico. Oceanographer a todas las visitas de [facility] se  lo haya indicado el mdico. Esto es importante. Dnde encontrar apoyo Hablar con [employer]  Los amigos y familiares pueden ser fuentes de apoyo y [place]. Hable con amigos o familiares de confianza sobre su afeccin. Explqueles cules son sus sntomas e infrmeles que est trabajando con un mdico para tratar ladepresin. Dgales a sus amigos y familiares cmo tambin pueden ser de [place]. Finanzas Busque proveedores de salud mental adecuados que se adapten a su situacin financiera. Hable con el mdico sobre las opciones para obtener descuentos en los medicamentos. Dnde buscar ms informacin Puede encontrar apoyo en su zona en: Anxiety and Depression Association of [place] [ADAA] (Asociacin de Ansiedad y Depresin de los [place]): www.adaa.org Mental Health [place] (Salud Mental de los [place]): www.mentalhealthamerica.net The First American on Mental Illness (Alianza Nacional Sobre Enfermedades Mentales): www.nami.org Comunquese con un mdico si: Deja de tomar los medicamentos antidepresivos y tiene alguno de estos sntomas: Nuseas. Dolor de [place]. Desvanecimiento. Escalofros y dolores corporales. Imposibilidad de dormir (insomnio). Usted o algn amigo o familiar advierten que su depresin est empeorando. Solicite ayuda de inmediato si: Piensa en lastimarse a usted mismo o a Economist. Si alguna vez siente que puede lastimarse a usted mismo o a Economist, o tiene pensamientos de poner fin a su vida, busque ayuda de inmediato. Dirjase al departamento de emergencias ms cercano o: Comunquese con el servicio de emergencias de su  localidad (911 en los [place]). Llame a una lnea de asistencia al suicida y atencin en crisis como National Suicide Prevention Lifeline (Lnea Nacional de Prevencin del Suicidio) al [phone]. Est disponible las 24 horas del da en los EE. UU. Enve un mensaje de texto a la lnea para casos de crisis al [phone] (en los EE. UU.). Resumen Si le diagnostican depresin, prepararse para sobrellevar los sntomas es una buena forma de sentirse positivo sobre su futuro. Trabaje con su mdico en un plan de tratamiento que incluya tcnicas de reduccin del estrs, medicamentos (si corresponde), terapia y hbitos saludables. Siga hablando con el mdico acerca de cmo funciona el tratamiento. Si tiene pensamientos acerca de quitarse la vida, llame a una lnea de asistencia al suicida o enve un mensaje de texto a una lnea de asistencia en crisis. Esta informacin no tiene Theme park manager el consejo del mdico. Asegresede hacerle al mdico cualquier pregunta que tenga. Document Revised: 02/06/2019 Document Reviewed: 02/06/2019 Elsevier Patient Education  2022 Elsevier Inc.   Document Revised: 01/14/2019 Document Reviewed: 01/14/2019 Elsevier Patient Education  2022 [employer].

## 2020-07-26 NOTE — Progress Notes (Signed)
[facility] Patient [facility] [address] [place], [place]  [address] Phone:  [phone]   Fax:  [phone]   Established Patient Office Visit  Subjective:  Patient ID: Teresa Orozco, female    DOB: 05/29/1975  Age: 45 y.o. MRN: [id number]  CC:  Chief Complaint  Patient presents with   Follow-up    SOB. Nothing has changed since last OV    HPI Teresa Orozco presents for follow up. She  has a past medical history of Medical history non-contributory.   She is getting agitated with any activity . She is unsure if this is due to weight gain or thyroid. She has a history of a goiter and wonders if this has any thing to do with her current symptoms. This is seen with house work. She has gained some weight. She gets shortness of breath. She was  referred to ENT however this was not done due to insurance.  She feeling anxious and has palpation. It feels like her heart is going to jump out of her chest. She has noticed that this 3 times per week. She feels like this is at night.  She feels like something is coming through the door. This has been increasing. She lives with her spouse and  children. She denies any real stress/problems. She denies previous history of anxiety. She feels like this is worse since her COVID infections. She does not notice SOB. She has noticed some depression during the day.    Past Surgical History:  Procedure Laterality Date   DILATION AND CURETTAGE OF UTERUS  2008    History reviewed. No pertinent family history.  Social History   Socioeconomic History   Marital status: Married    Spouse name: Not on file   Number of children: 6   Years of education: Not on file   Highest education level: 6th grade  Occupational History   Not on file  Tobacco Use   Smoking status: Never   Smokeless tobacco: Never  Vaping Use   Vaping Use: Never used  Substance and Sexual Activity   Alcohol use: No   Drug use: No   Sexual activity: Yes    Birth  control/protection: None  Other Topics Concern   Not on file  Social History Narrative   Not on file   Social Determinants of Health   Financial Resource Strain: Not on file  Food Insecurity: Not on file  Transportation Needs: Not on file  Physical Activity: Not on file  Stress: Not on file  Social Connections: Not on file  Intimate Partner Violence: Not on file    Outpatient Medications Prior to Visit  Medication Sig Dispense Refill   acetaminophen (TYLENOL) 325 MG tablet Take 650 mg by mouth every 6 (six) hours as needed for fever.     sulfamethoxazole-trimethoprim (BACTRIM DS) 800-160 MG tablet Take 1 tablet by mouth 2 (two) times daily. 14 tablet 0   No facility-administered medications prior to visit.    No Known Allergies  ROS Review of Systems    Objective:    Physical Exam Constitutional:      Appearance: She is obese.  HENT:     Head: Normocephalic and atraumatic.     Nose: Nose normal.     Mouth/Throat:     Mouth: Mucous membranes are moist.  Neck:     Comments: Goiter Cardiovascular:     Rate and Rhythm: Normal rate and regular rhythm.     Pulses: Normal  pulses.  Pulmonary:     Effort: Pulmonary effort is normal.     Breath sounds: Normal breath sounds.  Abdominal:     General: Bowel sounds are normal.     Palpations: Abdomen is soft.  Musculoskeletal:        General: Normal range of motion.     Cervical back: Normal range of motion.  Skin:    General: Skin is warm and dry.     Capillary Refill: Capillary refill takes less than 2 seconds.  Neurological:     General: No focal deficit present.     Mental Status: She is alert and oriented to person, place, and time.  Psychiatric:        Mood and Affect: Mood normal.        Behavior: Behavior normal.        Thought Content: Thought content normal.        Judgment: Judgment normal.    BP 120/79 (BP Location: Right Arm)   Pulse 74   Temp 98.4 F (36.9 C)   Ht 5' 4.5" (1.638 m)   Wt 179 lb  0.6 oz (81.2 kg)   LMP 01/19/2020   SpO2 99%   BMI 30.26 kg/m  Wt Readings from Last 3 Encounters:  07/26/20 179 lb 0.6 oz (81.2 kg)  04/26/20 167 lb (75.8 kg)  12/01/19 172 lb 9.6 oz (78.3 kg)     Health Maintenance Due  Topic Date Due   COVID-19 Vaccine (1) Never done    There are no preventive care reminders to display for this patient.  Lab Results  Component Value Date   TSH 1.430 07/26/2020   Lab Results  Component Value Date   WBC 8.7 12/01/2019   HGB 13.0 12/01/2019   HCT 38.8 12/01/2019   MCV 90 12/01/2019   PLT 326 12/01/2019   Lab Results  Component Value Date   NA 139 12/01/2019   K 3.9 12/01/2019   CO2 22 11/06/2019   GLUCOSE 94 12/01/2019   BUN 7 12/01/2019   CREATININE 0.58 12/01/2019   BILITOT 0.3 12/01/2019   ALKPHOS 78 12/01/2019   AST 16 12/01/2019   ALT 17 11/06/2019   PROT 6.7 12/01/2019   ALBUMIN 4.1 12/01/2019   CALCIUM 8.6 (L) 12/01/2019   ANIONGAP 10 09/13/2019   Lab Results  Component Value Date   CHOL 192 09/04/2018   Lab Results  Component Value Date   HDL 46 09/04/2018   Lab Results  Component Value Date   LDLCALC 107 (H) 09/04/2018   Lab Results  Component Value Date   TRIG 25 09/09/2019   No results found for: CHOLHDL Lab Results  Component Value Date   HGBA1C 5.3 12/01/2019      Assessment & Plan:   Problem List Items Addressed This Visit   None Visit Diagnoses     Anxiety    -  Primary Worsening requesting regular treatment    Relevant Medications   citalopram (CELEXA) 10 MG tablet   Missed period       Relevant Orders   POCT urine pregnancy (Completed)   Urinalysis Dipstick (Completed)   Palpitations     Persistent may be related to anxiety EKG normal   Relevant Orders   TSH (Completed)   EKG 12-Lead   Leukocytes in urine       Relevant Orders   Urine Culture (Completed)   Reactive depression     Worsening   Relevant Medications   citalopram (CELEXA)  10 MG tablet       Meds ordered  this encounter  Medications   albuterol (VENTOLIN HFA) 108 (90 Base) MCG/ACT inhaler    Sig: Inhale 2 puffs into the lungs every 6 (six) hours as needed for wheezing or shortness of breath.    Dispense:  8 g    Refill:  2    Order Specific Question:   Supervising Provider    Answer:   Quentin Angst [[id number]]   citalopram (CELEXA) 10 MG tablet    Sig: Take 1 tablet (10 mg total) by mouth daily.    Dispense:  30 tablet    Refill:  3    Order Specific Question:   Supervising Provider    Answer:   Quentin Angst [id number]    Follow-up: Return in about 3 months (around 10/26/2020).    Barbette Merino, NP

## 2020-07-27 LAB — TSH: TSH: 1.43 u[IU]/mL (ref 0.450–4.500)

## 2020-07-28 LAB — URINE CULTURE

## 2020-10-29 ENCOUNTER — Ambulatory Visit: Payer: Self-pay | Admitting: Nurse Practitioner

## 2020-11-15 ENCOUNTER — Other Ambulatory Visit: Payer: Self-pay

## 2020-11-15 ENCOUNTER — Encounter: Payer: Self-pay | Admitting: Nurse Practitioner

## 2020-11-15 ENCOUNTER — Ambulatory Visit (INDEPENDENT_AMBULATORY_CARE_PROVIDER_SITE_OTHER): Payer: Self-pay | Admitting: Nurse Practitioner

## 2020-11-15 VITALS — BP 127/83 | HR 91 | Temp 99.0°F | Ht 64.5 in | Wt 177.8 lb

## 2020-11-15 DIAGNOSIS — R309 Painful micturition, unspecified: Secondary | ICD-10-CM

## 2020-11-15 DIAGNOSIS — R0989 Other specified symptoms and signs involving the circulatory and respiratory systems: Secondary | ICD-10-CM

## 2020-11-15 DIAGNOSIS — M549 Dorsalgia, unspecified: Secondary | ICD-10-CM

## 2020-11-15 DIAGNOSIS — N390 Urinary tract infection, site not specified: Secondary | ICD-10-CM

## 2020-11-15 LAB — POCT INFLUENZA A/B
Influenza A, POC: NEGATIVE
Influenza B, POC: NEGATIVE

## 2020-11-15 LAB — POCT URINALYSIS DIP (CLINITEK)
Bilirubin, UA: NEGATIVE
Glucose, UA: NEGATIVE mg/dL
Ketones, POC UA: NEGATIVE mg/dL
Nitrite, UA: NEGATIVE
POC PROTEIN,UA: 30 — AB
Spec Grav, UA: 1.02 (ref 1.010–1.025)
Urobilinogen, UA: 1 E.U./dL
pH, UA: 5.5 (ref 5.0–8.0)

## 2020-11-15 LAB — POCT RAPID STREP A (OFFICE): Rapid Strep A Screen: NEGATIVE

## 2020-11-15 MED ORDER — BENZONATATE 100 MG PO CAPS: 100.0000 mg | ORAL_CAPSULE | Freq: Two times a day (BID) | ORAL | 0 refills | Status: DC | PRN

## 2020-11-15 MED ORDER — PHENAZOPYRIDINE HCL 200 MG PO TABS: 200.0000 mg | ORAL_TABLET | Freq: Three times a day (TID) | ORAL | 0 refills | Status: DC | PRN

## 2020-11-15 MED ORDER — NAPROXEN 500 MG PO TABS: 500.0000 mg | ORAL_TABLET | Freq: Two times a day (BID) | ORAL | 0 refills | Status: AC | PRN

## 2020-11-15 MED ORDER — NITROFURANTOIN MONOHYD MACRO 100 MG PO CAPS: 100.0000 mg | ORAL_CAPSULE | Freq: Two times a day (BID) | ORAL | 0 refills | Status: AC

## 2020-11-15 NOTE — Progress Notes (Signed)
[place],   [address] Phone:  [phone]   Fax:  [phone] Subjective:   Patient ID: Teresa Orozco, female    DOB: [birth year], 45 y.o.   MRN: [id number]  Chief Complaint  Patient presents with   Urinary Tract Infection    Pt is here today for painful urination x 5 days Pt also states that she has had a cough x 2 days and a fever last night. Pt states that she was vomiting this morning.    Teresa Orozco 45 y.o. female with no significant medical history  to the [facility] for upper respiratory symptoms and dysuria. Patient states that she has had burning with urination and back pain x 5 days. Has been taking OTC medications with mild improvement in symptoms. Currently rates back pain 5/10, states that at initiation of symptoms it was a 10/10. Has been taking tylenol for back pain and hot showers. Pain located in the diffuse upper back. Denies any recent trauma or injury. Endorses a subjective fever last night and this morning, that has now resolved. Verbalizes having a non productive cough x 2 days. Known sick contact, daughter had similar symptoms that resolved after 2 days. Has had vomiting after increased coughing. Also verbalizes having a sore throat and generalized pain/ aches.   Had COVID last year with similar symptoms, was hospitalized for 6 days. Denies receiving the COVID vaccine. Denies any other complaints today. Denies any fatigue, chest pain, shortness of breath, HA or dizziness. Denies any blurred vision, numbness or tingling.  Past Medical History:  Diagnosis Date   Medical history non-contributory     Past Surgical History:  Procedure Laterality Date   DILATION AND CURETTAGE OF UTERUS  2008    History reviewed. No pertinent family history.  Social History   Socioeconomic History   Marital status: Married    Spouse name: Not on file   Number of children: 6   Years of education: Not on file   Highest education  level: 6th grade  Occupational History   Not on file  Tobacco Use   Smoking status: Never   Smokeless tobacco: Never  Vaping Use   Vaping Use: Never used  Substance and Sexual Activity   Alcohol use: No   Drug use: No   Sexual activity: Yes    Birth control/protection: None  Other Topics Concern   Not on file  Social History Narrative   Not on file   Social Determinants of Health   Financial Resource Strain: Not on file  Food Insecurity: Not on file  Transportation Needs: Not on file  Physical Activity: Not on file  Stress: Not on file  Social Connections: Not on file  Intimate Partner Violence: Not on file    Outpatient Medications Prior to Visit  Medication Sig Dispense Refill   acetaminophen (TYLENOL) 325 MG tablet Take 650 mg by mouth every 6 (six) hours as needed for fever.     albuterol (VENTOLIN HFA) 108 (90 Base) MCG/ACT inhaler Inhale 2 puffs into the lungs every 6 (six) hours as needed for wheezing or shortness of breath. 8 g 2   citalopram (CELEXA) 10 MG tablet Take 1 tablet (10 mg total) by mouth daily. 30 tablet 3   No facility-administered medications prior to visit.    No Known Allergies  Review of Systems  Constitutional:  Positive for fever and malaise/fatigue. Negative for chills.  HENT:  Positive for sore throat. Negative for  congestion and ear pain.   Eyes: Negative.   Respiratory:  Positive for cough. Negative for hemoptysis, sputum production, shortness of breath and wheezing.   Cardiovascular:  Negative for chest pain, palpitations and leg swelling.  Gastrointestinal:  Negative for abdominal pain, blood in stool, constipation, diarrhea, nausea and vomiting.  Genitourinary:  Positive for dysuria. Negative for flank pain, frequency and urgency.  Musculoskeletal:  Positive for back pain.  Skin: Negative.   Psychiatric/Behavioral:  Negative for depression. The patient is not nervous/anxious.   All other systems reviewed and are negative.      Objective:    Physical Exam Vitals reviewed.  Constitutional:      General: She is not in acute distress.    Appearance: Normal appearance.  HENT:     Head: Normocephalic.     Right Ear: Tympanic membrane, ear canal and external ear normal.     Left Ear: Tympanic membrane, ear canal and external ear normal.     Nose: Nose normal.     Mouth/Throat:     Mouth: Mucous membranes are moist.     Pharynx: Oropharynx is clear. No oropharyngeal exudate or posterior oropharyngeal erythema.  Eyes:     Extraocular Movements: Extraocular movements intact.     Conjunctiva/sclera: Conjunctivae normal.     Pupils: Pupils are equal, round, and reactive to light.  Cardiovascular:     Rate and Rhythm: Normal rate and regular rhythm.     Pulses: Normal pulses.     Heart sounds: Normal heart sounds.     Comments: No obvious peripheral edema Pulmonary:     Effort: Pulmonary effort is normal.     Breath sounds: Normal breath sounds.  Musculoskeletal:     Cervical back: Normal range of motion and neck supple.  Skin:    General: Skin is warm and dry.     Capillary Refill: Capillary refill takes less than 2 seconds.  Neurological:     General: No focal deficit present.     Mental Status: She is alert and oriented to person, place, and time.  Psychiatric:        Mood and Affect: Mood normal.        Behavior: Behavior normal.        Thought Content: Thought content normal.        Judgment: Judgment normal.    BP 127/83   Pulse 91   Temp 99 F (37.2 C)   Ht 5' 4.5" (1.638 m)   Wt 177 lb 12.8 oz (80.6 kg)   SpO2 97%   BMI 30.05 kg/m  Wt Readings from Last 3 Encounters:  11/15/20 177 lb 12.8 oz (80.6 kg)  07/26/20 179 lb 0.6 oz (81.2 kg)  04/26/20 167 lb (75.8 kg)    There is no immunization history for the selected administration types on file for this patient.  Diabetic Foot Exam - Simple   No data filed     Lab Results  Component Value Date   TSH 1.430 07/26/2020   Lab  Results  Component Value Date   WBC 8.7 12/01/2019   HGB 13.0 12/01/2019   HCT 38.8 12/01/2019   MCV 90 12/01/2019   PLT 326 12/01/2019   Lab Results  Component Value Date   NA 139 12/01/2019   K 3.9 12/01/2019   CO2 22 11/06/2019   GLUCOSE 94 12/01/2019   BUN 7 12/01/2019   CREATININE 0.58 12/01/2019   BILITOT 0.3 12/01/2019   ALKPHOS 78 12/01/2019  AST 16 12/01/2019   ALT 17 11/06/2019   PROT 6.7 12/01/2019   ALBUMIN 4.1 12/01/2019   CALCIUM 8.6 (L) 12/01/2019   ANIONGAP 10 09/13/2019   Lab Results  Component Value Date   CHOL 192 09/04/2018   Lab Results  Component Value Date   HDL 46 09/04/2018   Lab Results  Component Value Date   LDLCALC 107 (H) 09/04/2018   Lab Results  Component Value Date   TRIG 25 09/09/2019   TRIG 193 (H) 09/04/2018   No results found for: CHOLHDL Lab Results  Component Value Date   HGBA1C 5.3 12/01/2019   HGBA1C 5.6 09/04/2018   HGBA1C 5.4 01/23/2017       Assessment & Plan:   Problem List Items Addressed This Visit       Genitourinary   UTI (urinary tract infection)   Relevant Medications   nitrofurantoin, macrocrystal-monohydrate, (MACROBID) 100 MG capsule   phenazopyridine (PYRIDIUM) 200 MG tablet   Other Visit Diagnoses     Painful urination    -  Primary   Relevant Medications   nitrofurantoin, macrocrystal-monohydrate, (MACROBID) 100 MG capsule   phenazopyridine (PYRIDIUM) 200 MG tablet   Other Relevant Orders   POCT URINALYSIS DIP (CLINITEK) (Completed)   Urine Culture   Symptoms of upper respiratory infection (URI)       Relevant Medications   benzonatate (TESSALON) 100 MG capsule   Other Relevant Orders   Influenza A/B : negative    Rapid Strep A: negative    Other acute back pain       Relevant Medications   naproxen (NAPROSYN) 500 MG tablet Patient encouraged to continue taking OTC medications as needed for symptoms, in addition to prescribed   Maintain upcoming appointment with PCP, follow up  sooner as needed    I am having Teresa Orozco start on nitrofurantoin (macrocrystal-monohydrate), phenazopyridine, naproxen, and benzonatate. I am also having her maintain her acetaminophen, albuterol, and citalopram.  Meds ordered this encounter  Medications   nitrofurantoin, macrocrystal-monohydrate, (MACROBID) 100 MG capsule    Sig: Take 1 capsule (100 mg total) by mouth 2 (two) times daily for 7 days.    Dispense:  14 capsule    Refill:  0   phenazopyridine (PYRIDIUM) 200 MG tablet    Sig: Take 1 tablet (200 mg total) by mouth 3 (three) times daily as needed for pain.    Dispense:  10 tablet    Refill:  0   naproxen (NAPROSYN) 500 MG tablet    Sig: Take 1 tablet (500 mg total) by mouth 2 (two) times daily as needed for up to 14 days for mild pain or moderate pain.    Dispense:  28 tablet    Refill:  0   benzonatate (TESSALON) 100 MG capsule    Sig: Take 1 capsule (100 mg total) by mouth 2 (two) times daily as needed for cough.    Dispense:  20 capsule    Refill:  0     Teena Dunk, NP

## 2020-11-15 NOTE — Patient Instructions (Signed)
You were seen today in the [facility] for UTI and upper respiratory infection. Labs were collected, results will be available via MyChart or, if abnormal, you will be contacted by clinic staff. You were prescribed medications, please take as directed. Please follow up in 1-2 wks as needed.

## 2020-11-19 LAB — URINE CULTURE

## 2020-11-24 ENCOUNTER — Ambulatory Visit: Payer: Self-pay | Admitting: Nurse Practitioner

## 2020-12-02 ENCOUNTER — Other Ambulatory Visit: Payer: Self-pay

## 2020-12-02 ENCOUNTER — Ambulatory Visit (INDEPENDENT_AMBULATORY_CARE_PROVIDER_SITE_OTHER): Payer: Self-pay | Admitting: Nurse Practitioner

## 2020-12-02 VITALS — BP 137/87 | HR 76 | Temp 97.2°F | Ht 64.5 in | Wt 175.1 lb

## 2020-12-02 DIAGNOSIS — E049 Nontoxic goiter, unspecified: Secondary | ICD-10-CM

## 2020-12-02 DIAGNOSIS — Z1329 Encounter for screening for other suspected endocrine disorder: Secondary | ICD-10-CM

## 2020-12-02 DIAGNOSIS — E876 Hypokalemia: Secondary | ICD-10-CM

## 2020-12-02 DIAGNOSIS — D509 Iron deficiency anemia, unspecified: Secondary | ICD-10-CM

## 2020-12-02 DIAGNOSIS — N898 Other specified noninflammatory disorders of vagina: Secondary | ICD-10-CM

## 2020-12-02 LAB — POCT URINALYSIS DIP (CLINITEK)
Bilirubin, UA: NEGATIVE
Glucose, UA: NEGATIVE mg/dL
Ketones, POC UA: NEGATIVE mg/dL
Leukocytes, UA: NEGATIVE
Nitrite, UA: NEGATIVE
POC PROTEIN,UA: NEGATIVE
Spec Grav, UA: 1.02 (ref 1.010–1.025)
Urobilinogen, UA: 1 E.U./dL
pH, UA: 7.5 (ref 5.0–8.0)

## 2020-12-02 NOTE — Progress Notes (Signed)
[facility] Patient [facility] [address] [place], [place]  [address] Phone:  [phone]   Fax:  [phone]   Established Patient Office Visit  Subjective:  Patient ID: Teresa Orozco, female    DOB: 16-Nov-1975  Age: 45 y.o. MRN: [id number]  CC:  Chief Complaint  Patient presents with   Follow-up    Burning sensation when urinating. Patient is requesting birth control she is interesting in mirena iud she used that in the past.     HPI Teresa Orozco presents for follow up. She  has a past medical history of Medical history non-contributory.   She is having dysuria. She did have did an odor and itch. She feel like this improved with the nitrofurantoin. She is hydration with water. She did have fever and chills on Sunday and Monday but feels like this has improved.   She feels like the cough is better now.   She is currently sexually active and is requesting birth control. She has had the IUD in the past. She would like to have the replaced. She was told that she maybe to get pregnant and she does not want any additional children.   Past Medical History:  Diagnosis Date   Medical history non-contributory     Past Surgical History:  Procedure Laterality Date   DILATION AND CURETTAGE OF UTERUS  2008    No family history on file.  Social History   Socioeconomic History   Marital status: Married    Spouse name: Not on file   Number of children: 6   Years of education: Not on file   Highest education level: 6th grade  Occupational History   Not on file  Tobacco Use   Smoking status: Never   Smokeless tobacco: Never  Vaping Use   Vaping Use: Never used  Substance and Sexual Activity   Alcohol use: No   Drug use: No   Sexual activity: Yes    Birth control/protection: None  Other Topics Concern   Not on file  Social History Narrative   Not on file   Social Determinants of Health   Financial Resource Strain: Not on file  Food Insecurity: Not on file   Transportation Needs: Not on file  Physical Activity: Not on file  Stress: Not on file  Social Connections: Not on file  Intimate Partner Violence: Not on file    Outpatient Medications Prior to Visit  Medication Sig Dispense Refill   acetaminophen (TYLENOL) 325 MG tablet Take 650 mg by mouth every 6 (six) hours as needed for fever.     albuterol (VENTOLIN HFA) 108 (90 Base) MCG/ACT inhaler Inhale 2 puffs into the lungs every 6 (six) hours as needed for wheezing or shortness of breath. 8 g 2   benzonatate (TESSALON) 100 MG capsule Take 1 capsule (100 mg total) by mouth 2 (two) times daily as needed for cough. 20 capsule 0   citalopram (CELEXA) 10 MG tablet Take 1 tablet (10 mg total) by mouth daily. (Patient not taking: Reported on 12/02/2020) 30 tablet 3   phenazopyridine (PYRIDIUM) 200 MG tablet Take 1 tablet (200 mg total) by mouth 3 (three) times daily as needed for pain. 10 tablet 0   No facility-administered medications prior to visit.    No Known Allergies  ROS Review of Systems    Objective:    Physical Exam Constitutional:      Appearance: She is normal weight. She is not ill-appearing, toxic-appearing or diaphoretic.  HENT:     Head: Normocephalic and atraumatic.     Nose: Nose normal.     Mouth/Throat:     Mouth: Mucous membranes are moist.  Neck:     Comments: Goiter  Cardiovascular:     Rate and Rhythm: Normal rate and regular rhythm.     Pulses: Normal pulses.     Heart sounds: Normal heart sounds.  Pulmonary:     Effort: Pulmonary effort is normal.     Breath sounds: Normal breath sounds.  Abdominal:     Palpations: Abdomen is soft.  Musculoskeletal:        General: Normal range of motion.  Skin:    General: Skin is warm and dry.     Capillary Refill: Capillary refill takes less than 2 seconds.  Neurological:     General: No focal deficit present.     Mental Status: She is alert and oriented to person, place, and time.  Psychiatric:        Mood  and Affect: Mood normal.        Behavior: Behavior normal.        Thought Content: Thought content normal.        Judgment: Judgment normal.     Comments: Little nervous related to Goiter    BP 137/87   Pulse 76   Temp (!) 97.2 F (36.2 C)   Ht 5' 4.5" (1.638 m)   Wt 175 lb 0.8 oz (79.4 kg)   SpO2 100%   BMI 29.58 kg/m  Wt Readings from Last 3 Encounters:  12/02/20 175 lb 0.8 oz (79.4 kg)  11/15/20 177 lb 12.8 oz (80.6 kg)  07/26/20 179 lb 0.6 oz (81.2 kg)     There are no preventive care reminders to display for this patient.   There are no preventive care reminders to display for this patient.  Lab Results  Component Value Date   TSH 1.430 07/26/2020   Lab Results  Component Value Date   WBC 8.7 12/01/2019   HGB 13.0 12/01/2019   HCT 38.8 12/01/2019   MCV 90 12/01/2019   PLT 326 12/01/2019   Lab Results  Component Value Date   NA 139 12/01/2019   K 3.9 12/01/2019   CO2 22 11/06/2019   GLUCOSE 94 12/01/2019   BUN 7 12/01/2019   CREATININE 0.58 12/01/2019   BILITOT 0.3 12/01/2019   ALKPHOS 78 12/01/2019   AST 16 12/01/2019   ALT 17 11/06/2019   PROT 6.7 12/01/2019   ALBUMIN 4.1 12/01/2019   CALCIUM 8.6 (L) 12/01/2019   ANIONGAP 10 09/13/2019                                             Lab Results  Component Value Date   CHOL 192 09/04/2018   Lab Results  Component Value Date   HDL 46 09/04/2018   Lab Results  Component Value Date   LDLCALC 107 (H) 09/04/2018   Lab Results  Component Value Date   TRIG 25 09/09/2019   No results found for: CHOLHDL Lab Results  Component Value Date   HGBA1C 5.3 12/01/2019      Assessment & Plan:   Problem List Items Addressed This Visit       Endocrine   Goiter Evaluation pending   Relevant Orders   TSH (Completed)   US THYROID  Other   Hypokalemia Reevaluation   Relevant Orders   Comp. Metabolic Panel (12) (Completed)   Other Visit Diagnoses     Vaginal irritation    -  Primary    Relevant Orders   NuSwab Vaginitis Plus (VG+) (Completed)   POCT URINALYSIS DIP (CLINITEK) (Completed)   Iron deficiency anemia, unspecified iron deficiency anemia type       Relevant Orders   CBC with Differential/Platelet (Completed)   Iron, TIBC and Ferritin Panel (Completed)   Screening for thyroid disorder           No orders of the defined types were placed in this encounter.   Follow-up: No follow-ups on file.    Vevelyn Francois, NP

## 2020-12-02 NOTE — Patient Instructions (Signed)
Bocio Goiter Se llama bocio al agrandamiento de la glndula tiroidea. La glndula tiroidea se ubica en la parte anterior e inferior del cuello. Produce hormonas que afectan muchas partes y sistemas del organismo, incluido el sistema que condiciona la rapidez con la que el cuerpo degrada los alimentos para producir energa (metabolismo). La mayora de los casos de bocio son indoloros y no son motivo de Personnel officer. Algunos casos de bocio pueden afectar la forma en que la tiroides fabrica hormonas tiroideas. El bocio y las enfermedades que lo causan se pueden tratar, si es necesario. Cules son las causas? Las causas ms frecuentes de esta afeccin incluyen las siguientes: Falta (deficiencia) de un mineral denominado yodo. La glndula tiroidea [place] yodo para fabricar hormonas tiroideas. Enfermedades que [employer] clulas sanas del cuerpo (enfermedades autoinmunitarias) y Biomedical scientist la funcin tiroidea, como la enfermedad de Graves o la enfermedad de Hashimoto. Estas enfermedades pueden hacer que el cuerpo produzca demasiadas hormonas tiroideas (hipertiroidismo) o demasiado pocas hormonas (hipotiroidismo). Afecciones que provocan una inflamacin de la tiroides (tiroiditis). Uno o ms crecimientos pequeos en la tiroides (bocio nodular). Otras causas son las siguientes: Problemas mdicos causados por genes anormales que se transmiten de padres a hijos (defectos genticos). Lesin o infeccin de la tiroides. Tumores que pueden o no ser cancerosos. Embarazo. Ciertos medicamentos. Exposicin a la radiacin. En algunos casos, es posible que la causa se desconozca. Qu incrementa el riesgo? Es ms probable que Dietitian en: Las personas que no consumen mucho yodo en la dieta. Las personas con antecedentes familiares de bocio. Mujeres. Las [name] de 40 aos de edad. Las personas que fuman tabaco. Personas que han estado expuestas a la radiacin. Cules son los signos  o sntomas? El sntoma principal de esta afeccin es la hinchazn en la parte anterior e inferior del cuello. Esta hinchazn puede ser Neomia Dear protuberancia muy pequea o un bulto de gran tamao. Otros sntomas pueden incluir lo siguiente: Sensacin de opresin en la garganta. Voz ronca. Tos. Sibilancias. Dificultad para tragar o respirar. Hinchazn de las venas del cuello. Mareos. Cuando el bocio es consecuencia de una hiperactividad de la glndula tiroidea (hipertiroidismo), los sntomas tambin pueden incluir lo siguiente: Nerviosismo o inquietud. Incapacidad para Patent examiner. Prdida de peso sin causa aparente. Diarrea. Cambios en la textura del pelo o la piel. Cambios en los latidos cardacos, como falta de latidos, latidos adicionales o frecuencia cardaca rpida. Ausencia de la [employer]. Temblores en las manos. Aumentar del apetito. Problemas para dormir. Cuando el bocio es consecuencia de una hipoactividad de la glndula tiroidea (hipotiroidismo), los sntomas tambin pueden incluir lo siguiente: Water engineer de falta de energa (letargo). Incapacidad para tolerar el fro. Aumento de peso que no puede explicarse por un cambio en la dieta o en los hbitos de ejercicio fsico. Piel seca. Pelo grueso. Perodos menstruales irregulares. Estreimiento. Tristeza o depresin. Fatiga. En algunos casos, es posible que no haya sntomas y los niveles de la hormona tiroidea sean normales. Cmo se diagnostica? Esta afeccin se puede diagnosticar en funcin de los sntomas, la historia clnica y los antecedentes mdicos. Pueden hacerle estudios, por ejemplo: Anlisis de sangre para controlar la funcin tiroidea. Pruebas de diagnstico por imgenes, por ejemplo: Ecografa. Exploracin por tomografa computarizada (TC). Resonancia magntica (RM). Gammagrafa tiroidea. Extraccin de [place] de tejido (biopsia) del bocio o de algn ndulo. [place] Fetter se analizar para Agricultural consultant. Cmo se trata? El tratamiento de esta afeccin depende de la causa y los sntomas. El [id number]  incluir: Medicamentos para regular los niveles de la hormona tiroidea. Antiinflamatorios o medicamentos con corticoesteroides, si la causa del bocio es una inflamacin. Suplementos de yodo o cambios en la dieta, si la causa del bocio es la carencia de yodo. Tratamiento con yodo radioactivo. [place] para extirpar la tiroides. En algunos casos, es posible que solo necesite controles peridicos con el mdico para Scientist, physiological afeccin y que no necesite tratamiento. Siga estas instrucciones en su casa: Siga las indicaciones del mdico sobre los cambios en la dieta. Use los medicamentos de venta libre y los recetados solamente como se lo haya indicado el mdico. Estos incluyen suplementos. No consuma ningn producto que contenga nicotina o tabaco, como cigarrillos y Administrator, Civil Service. Si necesita ayuda para dejar de consumir estos productos, consulte al mdico. Concurra a todas las visitas de seguimiento como se lo haya indicado el mdico. Esto es importante. Comunquese con un mdico si: Los sntomas no mejoran con Scientist, research (medical). Tiene nuseas, vmitos o diarrea. Solicite ayuda de inmediato si: Tiene un episodio repentino de confusin que no tiene explicacin u otros Air cabin crew. Tiene fiebre. Siente dolor en el pecho. Tiene problemas para respirar o tragar. De repente se siente muy dbil. Se siente muy inquieto. Siente que el corazn late rpidamente. Resumen Se llama bocio al agrandamiento de la glndula tiroidea. La glndula tiroidea se encuentra ubicada en la parte anterior e inferior del cuello. Produce hormonas que afectan muchas partes y sistemas del organismo, incluido el sistema que condiciona la rapidez con la que el cuerpo degrada los alimentos para producir energa (metabolismo). El sntoma principal de esta afeccin es la hinchazn en la parte anterior e  inferior del cuello. Esta hinchazn puede ser Neomia Dear protuberancia muy pequea o un bulto de gran tamao. El tratamiento de esta afeccin depende de la causa y los sntomas. Es posible que necesite medicamentos, suplementos o controles regulares de su afeccin. Esta informacin no tiene Theme park manager el consejo del mdico. Asegrese de hacerle al mdico cualquier pregunta que tenga. Document Revised: 11/07/2019 Document Reviewed: 09/23/2019 Elsevier Patient Education  2022 [employer].

## 2020-12-03 LAB — COMP. METABOLIC PANEL (12)
AST: 18 IU/L (ref 0–40)
Albumin/Globulin Ratio: 1.5 (ref 1.2–2.2)
Albumin: 4.3 g/dL (ref 3.8–4.8)
Alkaline Phosphatase: 76 IU/L (ref 44–121)
BUN/Creatinine Ratio: 11 (ref 9–23)
BUN: 7 mg/dL (ref 6–24)
Bilirubin Total: 0.3 mg/dL (ref 0.0–1.2)
Calcium: 8.8 mg/dL (ref 8.7–10.2)
Chloride: 102 mmol/L (ref 96–106)
Creatinine, Ser: 0.62 mg/dL (ref 0.57–1.00)
Globulin, Total: 2.8 g/dL (ref 1.5–4.5)
Glucose: 89 mg/dL (ref 70–99)
Potassium: 4.2 mmol/L (ref 3.5–5.2)
Sodium: 137 mmol/L (ref 134–144)
Total Protein: 7.1 g/dL (ref 6.0–8.5)
eGFR: 112 mL/min/{1.73_m2} (ref >59)

## 2020-12-03 LAB — CBC WITH DIFFERENTIAL/PLATELET
Basophils Absolute: 0 10*3/uL (ref 0.0–0.2)
Basos: 1 %
EOS (ABSOLUTE): 0.2 10*3/uL (ref 0.0–0.4)
Eos: 3 %
Hematocrit: 38 % (ref 34.0–46.6)
Hemoglobin: 12.4 g/dL (ref 11.1–15.9)
Immature Grans (Abs): 0 10*3/uL (ref 0.0–0.1)
Immature Granulocytes: 0 %
Lymphocytes Absolute: 2 10*3/uL (ref 0.7–3.1)
Lymphs: 34 %
MCH: 28.2 pg (ref 26.6–33.0)
MCHC: 32.6 g/dL (ref 31.5–35.7)
MCV: 87 fL (ref 79–97)
Monocytes Absolute: 0.6 10*3/uL (ref 0.1–0.9)
Monocytes: 10 %
Neutrophils Absolute: 3.1 10*3/uL (ref 1.4–7.0)
Neutrophils: 52 %
Platelets: 403 10*3/uL (ref 150–450)
RBC: 4.39 x10E6/uL (ref 3.77–5.28)
RDW: 14.4 % (ref 11.7–15.4)
WBC: 5.9 10*3/uL (ref 3.4–10.8)

## 2020-12-03 LAB — IRON,TIBC AND FERRITIN PANEL
Ferritin: 81 ng/mL (ref 15–150)
Iron Saturation: 8 % — CL (ref 15–55)
Iron: 32 ug/dL (ref 27–159)
Total Iron Binding Capacity: 383 ug/dL (ref 250–450)
UIBC: 351 ug/dL (ref 131–425)

## 2020-12-03 LAB — TSH: TSH: 1.54 u[IU]/mL (ref 0.450–4.500)

## 2020-12-05 ENCOUNTER — Encounter: Payer: Self-pay | Admitting: Nurse Practitioner

## 2020-12-05 LAB — NUSWAB VAGINITIS PLUS (VG+)
Atopobium vaginae: HIGH Score — AB
Candida albicans, NAA: NEGATIVE
Candida glabrata, NAA: NEGATIVE
Chlamydia trachomatis, NAA: NEGATIVE
Neisseria gonorrhoeae, NAA: NEGATIVE
Trich vag by NAA: NEGATIVE

## 2020-12-06 ENCOUNTER — Other Ambulatory Visit: Payer: Self-pay | Admitting: Nurse Practitioner

## 2020-12-06 MED ORDER — METRONIDAZOLE 500 MG PO TABS: 500.0000 mg | ORAL_TABLET | Freq: Three times a day (TID) | ORAL | 0 refills | Status: AC

## 2020-12-06 MED ORDER — FERROUS SULFATE 325 (65 FE) MG PO TABS: 325.0000 mg | ORAL_TABLET | Freq: Every day | ORAL | 3 refills | Status: DC

## 2020-12-07 ENCOUNTER — Ambulatory Visit (HOSPITAL_COMMUNITY): Payer: Self-pay

## 2021-03-04 ENCOUNTER — Ambulatory Visit: Payer: Self-pay | Admitting: Nurse Practitioner

## 2021-04-19 ENCOUNTER — Ambulatory Visit: Payer: Self-pay | Admitting: Nurse Practitioner

## 2021-04-26 ENCOUNTER — Ambulatory Visit: Payer: Self-pay | Admitting: Nurse Practitioner

## 2021-04-29 ENCOUNTER — Ambulatory Visit (INDEPENDENT_AMBULATORY_CARE_PROVIDER_SITE_OTHER): Payer: Self-pay | Admitting: Nurse Practitioner

## 2021-04-29 ENCOUNTER — Encounter: Payer: Self-pay | Admitting: Nurse Practitioner

## 2021-04-29 VITALS — BP 131/92 | HR 92 | Temp 98.1°F | Ht 64.5 in | Wt 177.4 lb

## 2021-04-29 DIAGNOSIS — E049 Nontoxic goiter, unspecified: Secondary | ICD-10-CM

## 2021-04-29 DIAGNOSIS — R0602 Shortness of breath: Secondary | ICD-10-CM

## 2021-04-29 DIAGNOSIS — N898 Other specified noninflammatory disorders of vagina: Secondary | ICD-10-CM | POA: Insufficient documentation

## 2021-04-29 DIAGNOSIS — D509 Iron deficiency anemia, unspecified: Secondary | ICD-10-CM

## 2021-04-29 LAB — POCT URINALYSIS DIP (CLINITEK)
Bilirubin, UA: NEGATIVE
Glucose, UA: NEGATIVE mg/dL
Ketones, POC UA: NEGATIVE mg/dL
Leukocytes, UA: NEGATIVE
Nitrite, UA: NEGATIVE
POC PROTEIN,UA: NEGATIVE
Spec Grav, UA: >=1.03 — AB (ref 1.010–1.025)
Urobilinogen, UA: 0.2 E.U./dL
pH, UA: 7.5 (ref 5.0–8.0)

## 2021-04-29 MED ORDER — ALBUTEROL SULFATE HFA 108 (90 BASE) MCG/ACT IN AERS: 2.0000 | INHALATION_SPRAY | Freq: Four times a day (QID) | RESPIRATORY_TRACT | 2 refills | Status: DC | PRN

## 2021-04-29 NOTE — Progress Notes (Signed)
@Patient  ID: , female    DOB: [birth year], 46 y.o.   MRN: [id number] ? ?Chief Complaint  ?Patient presents with  ? Follow-up  ?  Patient is here today for her 3 month follow up and to discuss vaginal irritation x 2 weeks.  ? ? ?Referring provider: ?[id number], NP ? ?HPI ? ?Patient presents today for 33-month follow-up.  Patient does have a history of goiter and anemia.  We will check labs today.  Patient is not on any medication for thyroid and it appears that her thyroid levels have been within normal range.  Overall patient states that she has been doing well she does have periods of fatigue at times.  Patient does have does have past history of shortness of breath and does use albuterol inhaler for this.  She states that she does need a refill her inhaler today.  Denies f/c/s, n/v/d, hemoptysis, PND, chest pain or edema. ? ? ? ? ? ? ?No Known Allergies ? ?There is no immunization history for the selected administration types on file for this patient. ? ?Past Medical History:  ?Diagnosis Date  ? Medical history non-contributory   ? ? ?Tobacco History: ?Social History  ? ?Tobacco Use  ?Smoking Status Never  ?Smokeless Tobacco Never  ? ?Counseling given: Not Answered ? ? ?Outpatient Encounter Medications as of 04/29/2021  ?Medication Sig  ? acetaminophen (TYLENOL) 325 MG tablet Take 650 mg by mouth every 6 (six) hours as needed for fever.  ? albuterol (VENTOLIN HFA) 108 (90 Base) MCG/ACT inhaler Inhale 2 puffs into the lungs every 6 (six) hours as needed for wheezing or shortness of breath.  ? benzonatate (TESSALON) 100 MG capsule Take 1 capsule (100 mg total) by mouth 2 (two) times daily as needed for cough. (Patient not taking: Reported on 04/29/2021)  ? citalopram (CELEXA) 10 MG tablet Take 1 tablet (10 mg total) by mouth daily. (Patient not taking: Reported on 12/02/2020)  ? ferrous sulfate 325 (65 FE) MG tablet Take 1 tablet (325 mg total) by mouth daily. (Patient not taking: Reported on 04/29/2021)  ?  [DISCONTINUED] albuterol (VENTOLIN HFA) 108 (90 Base) MCG/ACT inhaler Inhale 2 puffs into the lungs every 6 (six) hours as needed for wheezing or shortness of breath. (Patient not taking: Reported on 04/29/2021)  ? ?No facility-administered encounter medications on file as of 04/29/2021.  ? ? ? ?Review of Systems ? ?Review of Systems  ?Constitutional: Negative.   ?HENT: Negative.    ?Cardiovascular: Negative.   ?Gastrointestinal: Negative.   ?Allergic/Immunologic: Negative.   ?Neurological: Negative.   ?Psychiatric/Behavioral: Negative.     ? ? ? ?Physical Exam ? ?BP (!) 131/92   Pulse 92   Temp 98.1 ?F (36.7 ?C)   Ht 5' 4.5" (1.638 m)   Wt 177 lb 6.4 oz (80.5 kg)   SpO2 98%   BMI 29.98 kg/m?  ? ?Wt Readings from Last 5 Encounters:  ?04/29/21 177 lb 6.4 oz (80.5 kg)  ?12/02/20 175 lb 0.8 oz (79.4 kg)  ?11/15/20 177 lb 12.8 oz (80.6 kg)  ?07/26/20 179 lb 0.6 oz (81.2 kg)  ?04/26/20 167 lb (75.8 kg)  ? ? ? ?Physical Exam ?Vitals and nursing note reviewed.  ?Constitutional:   ?   General: She is not in acute distress. ?   Appearance: She is well-developed.  ?Cardiovascular:  ?   Rate and Rhythm: Normal rate and regular rhythm.  ?Pulmonary:  ?   Effort: Pulmonary effort is normal.  ?  Breath sounds: Normal breath sounds.  ?Neurological:  ?   Mental Status: She is alert and oriented to person, place, and time.  ? ? ? ?Lab Results: ? ?CBC ?   ?Component Value Date/Time  ? WBC 5.9 12/02/2020 1128  ? WBC 8.2 09/13/2019 0240  ? RBC 4.39 12/02/2020 1128  ? RBC 3.91 09/13/2019 0240  ? HGB 12.4 12/02/2020 1128  ? HCT 38.0 12/02/2020 1128  ? PLT 403 12/02/2020 1128  ? MCV 87 12/02/2020 1128  ? MCH 28.2 12/02/2020 1128  ? MCH 29.9 09/13/2019 0240  ? MCHC 32.6 12/02/2020 1128  ? MCHC 32.9 09/13/2019 0240  ? RDW 14.4 12/02/2020 1128  ? LYMPHSABS 2.0 12/02/2020 1128  ? MONOABS 0.9 09/13/2019 0240  ? EOSABS 0.2 12/02/2020 1128  ? BASOSABS 0.0 12/02/2020 1128  ? ? ?BMET ?   ?Component Value Date/Time  ? NA 137 12/02/2020 1128  ?  K 4.2 12/02/2020 1128  ? CL 102 12/02/2020 1128  ? CO2 22 11/06/2019 1123  ? GLUCOSE 89 12/02/2020 1128  ? GLUCOSE 118 (H) 09/13/2019 0240  ? BUN 7 12/02/2020 1128  ? CREATININE 0.62 12/02/2020 1128  ? CALCIUM 8.8 12/02/2020 1128  ? GFRNONAA 112 12/01/2019 1222  ? GFRAA 130 12/01/2019 1222  ? ? ?BNP ?   ?Component Value Date/Time  ? BNP 18.9 09/08/2019 2241  ? ? ?ProBNP ?No results found for: PROBNP ? ?Imaging: ?No results found. ? ? ?Assessment & Plan:  ? ?Vaginal irritation ?- POCT URINALYSIS DIP (CLINITEK) ? ?2. Iron deficiency anemia, unspecified iron deficiency anemia type ? ?- Iron, TIBC and Ferritin Panel ?- CBC ?- Comprehensive metabolic panel ? ?3. Goiter ? ?- TSH ? ?Follow up: ? ?Follow up in 3 months or sooner if needed - reassess goiter - may need [place] ? ? ? ? ?Ivonne Andrew, NP ?04/29/2021 ? ?

## 2021-04-29 NOTE — Patient Instructions (Addendum)
1. Vaginal irritation ? ?- POCT URINALYSIS DIP (CLINITEK) ? ?2. Iron deficiency anemia, unspecified iron deficiency anemia type ? ?- Iron, TIBC and Ferritin Panel ?- CBC ?- Comprehensive metabolic panel ? ?3. Goiter ? ?- TSH ? ?Follow up: ? ?Follow up in 3 months or sooner if needed - reassess goiter - may need [place] ? ? ? ?

## 2021-04-29 NOTE — Assessment & Plan Note (Signed)
-   POCT URINALYSIS DIP (CLINITEK) ? ?2. Iron deficiency anemia, unspecified iron deficiency anemia type ? ?- Iron, TIBC and Ferritin Panel ?- CBC ?- Comprehensive metabolic panel ? ?3. Goiter ? ?- TSH ? ?Follow up: ? ?Follow up in 3 months or sooner if needed - reassess goiter - may need [place] ?

## 2021-04-30 LAB — COMPREHENSIVE METABOLIC PANEL
ALT: 16 IU/L (ref 0–32)
AST: 20 IU/L (ref 0–40)
Albumin/Globulin Ratio: 1.8 (ref 1.2–2.2)
Albumin: 4.4 g/dL (ref 3.8–4.8)
Alkaline Phosphatase: 85 IU/L (ref 44–121)
BUN/Creatinine Ratio: 16 (ref 9–23)
BUN: 10 mg/dL (ref 6–24)
Bilirubin Total: 0.4 mg/dL (ref 0.0–1.2)
CO2: 26 mmol/L (ref 20–29)
Calcium: 8.9 mg/dL (ref 8.7–10.2)
Chloride: 106 mmol/L (ref 96–106)
Creatinine, Ser: 0.64 mg/dL (ref 0.57–1.00)
Globulin, Total: 2.5 g/dL (ref 1.5–4.5)
Glucose: 79 mg/dL (ref 70–99)
Potassium: 4 mmol/L (ref 3.5–5.2)
Sodium: 147 mmol/L — ABNORMAL HIGH (ref 134–144)
Total Protein: 6.9 g/dL (ref 6.0–8.5)
eGFR: 110 mL/min/{1.73_m2} (ref >59)

## 2021-04-30 LAB — CBC
Hematocrit: 37.9 % (ref 34.0–46.6)
Hemoglobin: 12.1 g/dL (ref 11.1–15.9)
MCH: 28.3 pg (ref 26.6–33.0)
MCHC: 31.9 g/dL (ref 31.5–35.7)
MCV: 89 fL (ref 79–97)
Platelets: 375 10*3/uL (ref 150–450)
RBC: 4.28 x10E6/uL (ref 3.77–5.28)
RDW: 14.1 % (ref 11.7–15.4)
WBC: 10 10*3/uL (ref 3.4–10.8)

## 2021-04-30 LAB — IRON,TIBC AND FERRITIN PANEL
Ferritin: 19 ng/mL (ref 15–150)
Iron Saturation: 11 % — ABNORMAL LOW (ref 15–55)
Iron: 47 ug/dL (ref 27–159)
Total Iron Binding Capacity: 410 ug/dL (ref 250–450)
UIBC: 363 ug/dL (ref 131–425)

## 2021-04-30 LAB — TSH: TSH: 1.28 u[IU]/mL (ref 0.450–4.500)

## 2021-05-02 ENCOUNTER — Other Ambulatory Visit: Payer: Self-pay | Admitting: Nurse Practitioner

## 2021-05-02 DIAGNOSIS — D649 Anemia, unspecified: Secondary | ICD-10-CM

## 2021-05-02 MED ORDER — FERROUS SULFATE 325 (65 FE) MG PO TABS: 325.0000 mg | ORAL_TABLET | Freq: Every day | ORAL | 3 refills | Status: AC

## 2021-05-04 ENCOUNTER — Other Ambulatory Visit: Payer: Self-pay | Admitting: Nurse Practitioner

## 2021-05-04 LAB — NUSWAB VAGINITIS PLUS (VG+)
Candida albicans, NAA: NEGATIVE
Candida glabrata, NAA: POSITIVE — AB
Chlamydia trachomatis, NAA: NEGATIVE
Neisseria gonorrhoeae, NAA: NEGATIVE
Trich vag by NAA: NEGATIVE

## 2021-05-04 MED ORDER — FLUCONAZOLE 150 MG PO TABS
150.0000 mg | ORAL_TABLET | Freq: Every day | ORAL | 0 refills | Status: DC
Start: 2021-05-04 — End: 2022-01-06

## 2021-05-04 MED ORDER — METRONIDAZOLE 500 MG PO TABS: 500.0000 mg | ORAL_TABLET | Freq: Two times a day (BID) | ORAL | 0 refills | Status: AC

## 2021-06-03 ENCOUNTER — Encounter: Payer: Self-pay | Admitting: Nurse Practitioner

## 2021-06-03 ENCOUNTER — Ambulatory Visit (INDEPENDENT_AMBULATORY_CARE_PROVIDER_SITE_OTHER): Payer: Self-pay | Admitting: Nurse Practitioner

## 2021-06-03 VITALS — BP 141/93 | HR 81 | Temp 98.2°F | Ht 64.5 in | Wt 181.4 lb

## 2021-06-03 DIAGNOSIS — R0602 Shortness of breath: Secondary | ICD-10-CM

## 2021-06-03 DIAGNOSIS — H6691 Otitis media, unspecified, right ear: Secondary | ICD-10-CM

## 2021-06-03 MED ORDER — AMOXICILLIN 875 MG PO TABS: 875.0000 mg | ORAL_TABLET | Freq: Two times a day (BID) | ORAL | 0 refills | Status: AC

## 2021-06-03 MED ORDER — PREDNISONE 20 MG PO TABS: 20.0000 mg | ORAL_TABLET | Freq: Every day | ORAL | 0 refills | Status: AC

## 2021-06-03 MED ORDER — ALBUTEROL SULFATE HFA 108 (90 BASE) MCG/ACT IN AERS: 2.0000 | INHALATION_SPRAY | Freq: Four times a day (QID) | RESPIRATORY_TRACT | 2 refills | Status: DC | PRN

## 2021-06-03 NOTE — Assessment & Plan Note (Signed)
-   amoxicillin (AMOXIL) 875 MG tablet; Take 1 tablet (875 mg total) by mouth 2 (two) times daily for 10 days.  Dispense: 20 tablet; Refill: 0  2. Shortness of breath  - albuterol (VENTOLIN HFA) 108 (90 Base) MCG/ACT inhaler; Inhale 2 puffs into the lungs every 6 (six) hours as needed for wheezing or shortness of breath.  Dispense: 8 g; Refill: 2 - predniSONE (DELTASONE) 20 MG tablet; Take 1 tablet (20 mg total) by mouth daily with breakfast for 5 days.  Dispense: 5 tablet; Refill: 0   Stay well hydrated  Stay active  Deep breathing exercises  May take tylenol for fever or pain  May take mucinex DM twice daily    Follow up:  Follow up if needed

## 2021-06-03 NOTE — Patient Instructions (Addendum)
1. Right otitis media, unspecified otitis media type  - amoxicillin (AMOXIL) 875 MG tablet; Take 1 tablet (875 mg total) by mouth 2 (two) times daily for 10 days.  Dispense: 20 tablet; Refill: 0  2. Shortness of breath  - albuterol (VENTOLIN HFA) 108 (90 Base) MCG/ACT inhaler; Inhale 2 puffs into the lungs every 6 (six) hours as needed for wheezing or shortness of breath.  Dispense: 8 g; Refill: 2 - predniSONE (DELTASONE) 20 MG tablet; Take 1 tablet (20 mg total) by mouth daily with breakfast for 5 days.  Dispense: 5 tablet; Refill: 0   Stay well hydrated  Stay active  Deep breathing exercises  May take tylenol for fever or pain  May take mucinex DM twice daily    Follow up:  Follow up if needed

## 2021-06-03 NOTE — Progress Notes (Signed)
@Patient  ID: , female    DOB: 15-Aug-1975, 46 y.o.   MRN: [id number]  Chief Complaint  Patient presents with   Follow-up    We will be using the language services line during this visit. Patient is here today for cold symptoms x 1 week. Symptoms- head and chest congestion, ear pains    Referring provider: [id number], NP   HPI  Patient presents today for sick visit.  Patient states that her symptoms started this past Monday.  Patient has had sinus congestion, chest congestion, cough, fever, right ear pain. Denies f/c/s, n/v/d, hemoptysis, PND, chest pain or edema.     No Known Allergies  There is no immunization history for the selected administration types on file for this patient.  Past Medical History:  Diagnosis Date   Medical history non-contributory     Tobacco History: Social History   Tobacco Use  Smoking Status Never  Smokeless Tobacco Never   Counseling given: Not Answered   Outpatient Encounter Medications as of 06/03/2021  Medication Sig   amoxicillin (AMOXIL) 875 MG tablet Take 1 tablet (875 mg total) by mouth 2 (two) times daily for 10 days.   ferrous sulfate 325 (65 FE) MG tablet Take 1 tablet (325 mg total) by mouth daily.   fluconazole (DIFLUCAN) 150 MG tablet Take 1 tablet (150 mg total) by mouth daily.   predniSONE (DELTASONE) 20 MG tablet Take 1 tablet (20 mg total) by mouth daily with breakfast for 5 days.   [DISCONTINUED] albuterol (VENTOLIN HFA) 108 (90 Base) MCG/ACT inhaler Inhale 2 puffs into the lungs every 6 (six) hours as needed for wheezing or shortness of breath.   acetaminophen (TYLENOL) 325 MG tablet Take 650 mg by mouth every 6 (six) hours as needed for fever. (Patient not taking: Reported on 06/03/2021)   albuterol (VENTOLIN HFA) 108 (90 Base) MCG/ACT inhaler Inhale 2 puffs into the lungs every 6 (six) hours as needed for wheezing or shortness of breath.   benzonatate (TESSALON) 100 MG capsule Take 1 capsule (100 mg total)  by mouth 2 (two) times daily as needed for cough. (Patient not taking: Reported on 04/29/2021)   citalopram (CELEXA) 10 MG tablet Take 1 tablet (10 mg total) by mouth daily. (Patient not taking: Reported on 12/02/2020)   No facility-administered encounter medications on file as of 06/03/2021.     Review of Systems  Review of Systems  Constitutional: Negative.   HENT:  Positive for congestion, ear pain, postnasal drip, sinus pressure and sinus pain.   Cardiovascular: Negative.   Gastrointestinal: Negative.   Allergic/Immunologic: Negative.   Neurological: Negative.   Psychiatric/Behavioral: Negative.        Physical Exam  BP (!) 141/93   Pulse 81   Temp 98.2 F (36.8 C)   Ht 5' 4.5" (1.638 m)   Wt 181 lb 6.4 oz (82.3 kg)   SpO2 98%   BMI 30.66 kg/m   Wt Readings from Last 5 Encounters:  06/03/21 181 lb 6.4 oz (82.3 kg)  04/29/21 177 lb 6.4 oz (80.5 kg)  12/02/20 175 lb 0.8 oz (79.4 kg)  11/15/20 177 lb 12.8 oz (80.6 kg)  07/26/20 179 lb 0.6 oz (81.2 kg)     Physical Exam Vitals and nursing note reviewed.  Constitutional:      General: She is not in acute distress.    Appearance: She is well-developed.  HENT:     Right Ear: Tympanic membrane is erythematous and bulging.  Nose: Congestion present.  Cardiovascular:     Rate and Rhythm: Normal rate and regular rhythm.  Pulmonary:     Effort: Pulmonary effort is normal.     Breath sounds: Normal breath sounds.  Neurological:     Mental Status: She is alert and oriented to person, place, and time.      Assessment & Plan:   Right otitis media - amoxicillin (AMOXIL) 875 MG tablet; Take 1 tablet (875 mg total) by mouth 2 (two) times daily for 10 days.  Dispense: 20 tablet; Refill: 0  2. Shortness of breath  - albuterol (VENTOLIN HFA) 108 (90 Base) MCG/ACT inhaler; Inhale 2 puffs into the lungs every 6 (six) hours as needed for wheezing or shortness of breath.  Dispense: 8 g; Refill: 2 - predniSONE  (DELTASONE) 20 MG tablet; Take 1 tablet (20 mg total) by mouth daily with breakfast for 5 days.  Dispense: 5 tablet; Refill: 0   Stay well hydrated  Stay active  Deep breathing exercises  May take tylenol for fever or pain  May take mucinex DM twice daily    Follow up:  Follow up if needed     Ivonne Andrew, NP 06/03/2021

## 2021-07-28 ENCOUNTER — Ambulatory Visit (INDEPENDENT_AMBULATORY_CARE_PROVIDER_SITE_OTHER): Payer: Self-pay | Admitting: Family Medicine

## 2021-07-28 ENCOUNTER — Encounter: Payer: Self-pay | Admitting: Family Medicine

## 2021-07-28 VITALS — BP 142/96 | HR 81 | Temp 98.0°F | Ht 64.5 in | Wt 187.0 lb

## 2021-07-28 DIAGNOSIS — R300 Dysuria: Secondary | ICD-10-CM

## 2021-07-28 DIAGNOSIS — Z789 Other specified health status: Secondary | ICD-10-CM

## 2021-07-28 DIAGNOSIS — R3915 Urgency of urination: Secondary | ICD-10-CM

## 2021-07-28 DIAGNOSIS — N390 Urinary tract infection, site not specified: Secondary | ICD-10-CM

## 2021-07-28 LAB — POCT URINALYSIS DIP (CLINITEK)
Bilirubin, UA: NEGATIVE
Glucose, UA: NEGATIVE mg/dL
Ketones, POC UA: NEGATIVE mg/dL
Nitrite, UA: NEGATIVE
POC PROTEIN,UA: 30 — AB
Spec Grav, UA: 1.02 (ref 1.010–1.025)
Urobilinogen, UA: 1 E.U./dL
pH, UA: 7 (ref 5.0–8.0)

## 2021-07-28 MED ORDER — SULFAMETHOXAZOLE-TRIMETHOPRIM 800-160 MG PO TABS: 1.0000 | ORAL_TABLET | Freq: Two times a day (BID) | ORAL | 0 refills | Status: AC

## 2021-07-28 NOTE — Progress Notes (Signed)
Established Patient Office Visit  Subjective   Patient ID: Teresa Orozco, female    DOB: September 11, 1975  Age: 45 y.o. MRN: [id number]  Chief Complaint  Patient presents with   OFFICE VISIT    Pt states she has a burning sensation when she urinates, vaginal irritations also has pain in her waist. Pt stated she has this issues for the past 6 days she did take OTC medication    Teresa Orozco is a 46 year old female that presents with complaints of dysuria, low back pain, abdominal pain, urgency, and vaginal pressure over the past 6 days.  Patient also endorses recurrent urinary tract infections.  She endorses hematuria.  Patient has not attempted any over-the-counter interventions to relieve this problem.  Dysuria  This is a chronic problem. The quality of the pain is described as aching and burning. The pain is at a severity of 6/10. The pain is moderate. She is Not sexually active. There is No history of pyelonephritis. Associated symptoms include flank pain, hematuria and urgency. She has tried acetaminophen for the symptoms.    Patient Active Problem List   Diagnosis Date Noted   Right otitis media 06/03/2021   Vaginal irritation 04/29/2021   Upper back pain on right side 11/06/2019   Muscle spasm 11/06/2019   History of COVID-19 11/06/2019   Pneumonia due to COVID-19 virus 09/09/2019   UTI (urinary tract infection) 09/09/2019   Severe sepsis ([place]) 09/09/2019   Hypokalemia 09/09/2019   Goiter 09/09/2019   Well woman exam with routine gynecological exam 08/27/2018   ASCUS with positive high risk HPV cervical 10/02/2017   Grand multipara 04/23/2017   Language barrier 04/23/2017   Advanced maternal age in multigravida >40 07/26/2015   Past Medical History:  Diagnosis Date   Medical history non-contributory    Past Surgical History:  Procedure Laterality Date   DILATION AND CURETTAGE OF UTERUS  2008   Social History   Tobacco Use   Smoking status: Never   Smokeless tobacco: Never   Vaping Use   Vaping Use: Never used  Substance Use Topics   Alcohol use: No   Drug use: No   Social History   Socioeconomic History   Marital status: Married    Spouse name: Not on file   Number of children: 6   Years of education: Not on file   Highest education level: 6th grade  Occupational History   Not on file  Tobacco Use   Smoking status: Never   Smokeless tobacco: Never  Vaping Use   Vaping Use: Never used  Substance and Sexual Activity   Alcohol use: No   Drug use: No   Sexual activity: Yes    Birth control/protection: None  Other Topics Concern   Not on file  Social History Narrative   Not on file   Social Determinants of Health   Financial Resource Strain: Not on file  Food Insecurity: Not on file  Transportation Needs: No Transportation Needs (08/27/2018)   PRAPARE - Transportation    Lack of Transportation (Medical): No    Lack of Transportation (Non-Medical): No  Physical Activity: Not on file  Stress: Not on file  Social Connections: Not on file  Intimate Partner Violence: Not on file   Family Status  Relation Name Status   Mother  Alive   Father  Alive   Sister  Alive   Brother  Alive   No family history on file. No Known Allergies    Review of Systems  Constitutional: Negative.   HENT: Negative.    Eyes: Negative.   Cardiovascular: Negative.   Genitourinary:  Positive for dysuria, flank pain, hematuria and urgency.  Skin: Negative.   Neurological: Negative.   Psychiatric/Behavioral: Negative.        Objective:     BP (!) 142/96 (BP Location: Left Arm, Patient Position: Sitting, Cuff Size: Normal)   Pulse 81   Temp 98 F (36.7 C)   Ht 5' 4.5" (1.638 m)   Wt 187 lb (84.8 kg)   SpO2 100%   BMI 31.60 kg/m  BP Readings from Last 3 Encounters:  07/28/21 (!) 142/96  06/03/21 (!) 141/93  04/29/21 (!) 131/92   Wt Readings from Last 3 Encounters:  07/28/21 187 lb (84.8 kg)  06/03/21 181 lb 6.4 oz (82.3 kg)  04/29/21 177 lb  6.4 oz (80.5 kg)      Physical Exam Constitutional:      Appearance: Normal appearance.  Cardiovascular:     Rate and Rhythm: Normal rate and regular rhythm.     Pulses: Normal pulses.  Pulmonary:     Effort: Pulmonary effort is normal.  Abdominal:     General: Bowel sounds are normal.  Musculoskeletal:        General: Normal range of motion.  Skin:    General: Skin is warm.  Neurological:     General: No focal deficit present.     Mental Status: She is alert. Mental status is at baseline.  Psychiatric:        Mood and Affect: Mood normal.        Behavior: Behavior normal.        Thought Content: Thought content normal.        Judgment: Judgment normal.      Results for orders placed or performed in visit on 07/28/21  POCT URINALYSIS DIP (CLINITEK)  Result Value Ref Range   Color, UA yellow yellow   Clarity, UA cloudy (A) clear   Glucose, UA negative negative mg/dL   Bilirubin, UA negative negative   Ketones, POC UA negative negative mg/dL   Spec Grav, UA 1.020 1.010 - 1.025   Blood, UA small (A) negative   pH, UA 7.0 5.0 - 8.0   POC PROTEIN,UA =30 (A) negative, trace   Urobilinogen, UA 1.0 0.2 or 1.0 E.U./dL   Nitrite, UA Negative Negative   Leukocytes, UA Large (3+) (A) Negative    Last CBC Lab Results  Component Value Date   WBC 10.0 04/29/2021   HGB 12.1 04/29/2021   HCT 37.9 04/29/2021   MCV 89 04/29/2021   MCH 28.3 04/29/2021   RDW 14.1 04/29/2021   PLT 375 08/67/6195   Last metabolic panel Lab Results  Component Value Date   GLUCOSE 79 04/29/2021   NA 147 (H) 04/29/2021   K 4.0 04/29/2021   CL 106 04/29/2021   CO2 26 04/29/2021   BUN 10 04/29/2021   CREATININE 0.64 04/29/2021   EGFR 110 04/29/2021   CALCIUM 8.9 04/29/2021   PROT 6.9 04/29/2021   ALBUMIN 4.4 04/29/2021   LABGLOB 2.5 04/29/2021   AGRATIO 1.8 04/29/2021   BILITOT 0.4 04/29/2021   ALKPHOS 85 04/29/2021   AST 20 04/29/2021   ALT 16 04/29/2021   ANIONGAP 10 09/13/2019    Last lipids Lab Results  Component Value Date   CHOL 192 09/04/2018   HDL 46 09/04/2018   LDLCALC 107 (H) 09/04/2018   TRIG 25 09/09/2019   Last hemoglobin A1c Lab  Results  Component Value Date   HGBA1C 5.3 12/01/2019   Last thyroid functions Lab Results  Component Value Date   TSH 1.280 04/29/2021   Last vitamin D No results found for: "25OHVITD2", "25OHVITD3", "VD25OH" Last vitamin B12 and Folate Lab Results  Component Value Date   PHXTAVWP79 480 12/01/2019      The 10-year ASCVD risk score (Arnett DK, et al., 2019) is: 1.3%    Assessment & Plan:   Problem List Items Addressed This Visit   None Visit Diagnoses     Burning with urination    -  Primary   Relevant Orders   POCT URINALYSIS DIP (CLINITEK) (Completed)   NuSwab Vaginitis Plus (VG+)   Urine Culture     1. Burning with urination  - POCT URINALYSIS DIP (CLINITEK) - NuSwab Vaginitis Plus (VG+) - Urine Culture  2. Urinary tract infection without hematuria, site unspecified We will start Bactrim empirically while awaiting urine culture results. - sulfamethoxazole-trimethoprim (BACTRIM DS) 800-160 MG tablet; Take 1 tablet by mouth 2 (two) times daily for 7 days.  Dispense: 14 tablet; Refill: 0  3. Language barrier Patient primarily speaks spanish, used video interpreter  4. Urinary urgency  - POCT URINALYSIS DIP (CLINITEK) - NuSwab Vaginitis Plus (VG+) - Urine Culture   Donia Pounds  APRN, MSN, FNP-C [facility] [address] [place], [place] [address] [phone]   Return if symptoms worsen or fail to improve.   Donia Pounds  APRN, MSN, FNP-C [facility] [address] [place], [place] [address] [phone]

## 2021-07-28 NOTE — Patient Instructions (Signed)
Infeccin urinaria en los adultos Urinary Tract Infection, Adult Una infeccin urinaria (IU) puede ocurrir en cualquier lugar de las vas urinarias. Las vas urinarias incluyen lo siguiente: Los riones. Los urteres. La vejiga. La uretra. Estos rganos fabrican, almacenan y eliminan el pis (orina) del cuerpo. Cules son las causas? La causa de esta infeccin es la presencia de grmenes (bacterias) en la zona genital. Estos grmenes proliferan y causan hinchazn (inflamacin) de las vas urinarias. Qu incrementa el riesgo? Los siguientes factores pueden hacer que sea ms propensa a contraer esta afeccin: Usar un tubo delgado y pequeo (catter) para drenar el pis. Imposibilidad de controlar el momento de orinar o defecar (incontinencia). Ser mujer. Si usted es mujer, estas cosas pueden aumentar el riesgo: Usar estos mtodos para evitar un embarazo: Un medicamento que mata los espermatozoides (espermicida). Un dispositivo que impide el paso de los espermatozoides (diafragma). Tener niveles bajos de una hormona femenina (estrgeno). Estar embarazada. Es ms probable que sufra esta afeccin si: Tiene genes que aumentan su riesgo. Es sexualmente activa. Toma antibiticos. Tiene dificultad para orinar debido a: Su prstata es ms grande de lo normal, si usted es hombre. Obstruccin en la parte del cuerpo que drena el pis de la vejiga. Clculo renal. Un trastorno nervioso que afecta la vejiga. No bebe una cantidad suficiente de lquido. No hace pis con la frecuencia suficiente. Tiene otras afecciones, como: Diabetes. Un sistema que combate las enfermedades (sistema inmunitario) debilitado. Anemia drepanoctica. Gota. Lesin en la columna vertebral. Cules son los signos o sntomas? Los sntomas de esta afeccin incluyen: Necesidad inmediata de hacer pis. Hacer poca cantidad de pis con mucha frecuencia. Dolor o ardor al hacer pis. Sangre en el pis. Pis que huele mal o  anormal. Dificultad para hacer pis. Pis turbio. Lquido que sale de la vagina, si es mujer. Dolor en la barriga o en la parte baja de la espalda. Otros sntomas pueden incluir los siguientes: Vmitos. Falta de apetito. Sentirse confundido (confuso). Este puede ser el primer sntoma en los adultos mayores. Sentirse cansado y malhumorado (irritable). Fiebre. Materia fecal lquida (diarrea). Cmo se trata? Recibir antibiticos. Recibir otros medicamentos. Beber una cantidad suficiente de agua. En algunos casos, es posible que deba consultar a un especialista. Siga estas instrucciones en su casa:  Medicamentos Use los medicamentos de venta libre y los recetados solamente como se lo haya indicado el mdico. Si le recetaron un antibitico, tmelo como se lo haya indicado el mdico. No deje de tomarlo aunque comience a sentirse mejor. Instrucciones generales Asegrese de hacer lo siguiente: Haga pis hasta que la vejiga quede vaca. No contenga el pis durante mucho tiempo. Vaciar la vejiga despus de tener sexo. Lmpiese de adelante hacia atrs despus de hacer pis o defecar, si es mujer. Use cada trozo de papel higinico solo una vez cuando se limpie. Beba suficiente lquido como para mantener la orina de color amarillo plido. Cumpla con todas las visitas de seguimiento. Comunquese con un mdico si: No mejora despus de 1 o 2 das de tratamiento. Los sntomas desaparecen y luego reaparecen. Solicite ayuda de inmediato si: Tiene un dolor muy intenso en la espalda. Tiene dolor muy intenso en la parte baja de la barriga. Tiene fiebre. Tiene escalofros. Se siente como si fuera a vomitar o vomita. Resumen Una infeccin urinaria (IU) puede ocurrir en cualquier lugar de las vas urinarias. Esta afeccin es causada por la presencia de grmenes en la zona genital. Existen muchos factores de riesgo de sufrir una IU. El tratamiento   incluye antibiticos. Beba suficiente lquido como para  mantener la orina de color amarillo plido. Esta informacin no tiene como fin reemplazar el consejo del mdico. Asegrese de hacerle al mdico cualquier pregunta que tenga. Document Revised: 11/14/2019 Document Reviewed: 11/14/2019 Elsevier Patient Education  2023 Elsevier Inc.  

## 2021-07-30 LAB — URINE CULTURE

## 2021-07-31 LAB — NUSWAB VAGINITIS PLUS (VG+)
Atopobium vaginae: HIGH Score — AB
Candida albicans, NAA: POSITIVE — AB
Candida glabrata, NAA: POSITIVE — AB
Chlamydia trachomatis, NAA: NEGATIVE
Neisseria gonorrhoeae, NAA: NEGATIVE
Trich vag by NAA: NEGATIVE

## 2021-08-01 ENCOUNTER — Other Ambulatory Visit: Payer: Self-pay | Admitting: Family Medicine

## 2021-08-01 DIAGNOSIS — B379 Candidiasis, unspecified: Secondary | ICD-10-CM

## 2021-08-01 MED ORDER — FLUCONAZOLE 150 MG PO TABS: 150.0000 mg | ORAL_TABLET | ORAL | 0 refills | Status: AC

## 2021-08-01 NOTE — Progress Notes (Signed)
Please inform patient patient that swab yielded yeast. Diflucan 150 mg once every week for 2 weeks.   Nolon Nations  APRN, MSN, FNP-C [facility] [address] [place], [place] [address] [phone]

## 2021-08-01 NOTE — Progress Notes (Signed)
Meds ordered this encounter  Medications   fluconazole (DIFLUCAN) 150 MG tablet    Sig: Take 1 tablet (150 mg total) by mouth once a week for 2 doses.    Dispense:  2 tablet    Refill:  0    Order Specific Question:   Supervising Provider    Answer:   Quentin Angst [[id number]]  Nolon Nations  APRN, MSN, FNP-C [facility] [address] [place], [place] [address] [phone]

## 2021-08-03 ENCOUNTER — Ambulatory Visit (INDEPENDENT_AMBULATORY_CARE_PROVIDER_SITE_OTHER): Payer: Self-pay | Admitting: Nurse Practitioner

## 2021-08-03 ENCOUNTER — Encounter: Payer: Self-pay | Admitting: Nurse Practitioner

## 2021-08-03 VITALS — BP 124/89 | HR 75 | Temp 98.0°F | Ht 64.5 in | Wt 183.2 lb

## 2021-08-03 DIAGNOSIS — E049 Nontoxic goiter, unspecified: Secondary | ICD-10-CM

## 2021-08-03 MED ORDER — IBUPROFEN 600 MG PO TABS: 600.0000 mg | ORAL_TABLET | Freq: Three times a day (TID) | ORAL | 0 refills | Status: AC | PRN

## 2021-08-03 NOTE — Patient Instructions (Signed)
1. Goiter  - US THYROID; Future  2. Pain and inflammation hands  - will order ibuprofen   Follow up:  Follow up in 6 months or sooner if needed

## 2021-08-03 NOTE — Assessment & Plan Note (Signed)
-   US THYROID; Future  2. Pain and inflammation hands  - will order ibuprofen   Follow up:  Follow up in 6 months or sooner if needed

## 2021-08-03 NOTE — Progress Notes (Addendum)
$@Patientg$  ID: Teresa Orozco, female    DOB: 16-Apr-1975, 46 y.o.   MRN: [id number]  Chief Complaint  Patient presents with   Follow-up    Pt is here for 3 months follow up. Pt is concern about her hand finger and neck is swollen.    Referring provider: Fenton Foy, NP   HPI  Patient presents today for 7-monthfollow-up.  Patient is concerned about inflammation and pain to bilateral hands.  She also has noticed swelling to her neck.  We will order an ultrasound of her thyroid. Denies f/c/s, n/v/d, hemoptysis, PND, leg swelling Denies chest pain or edema       No Known Allergies  There is no immunization history for the selected administration types on file for this patient.  Past Medical History:  Diagnosis Date   Medical history non-contributory     Tobacco History: Social History   Tobacco Use  Smoking Status Never  Smokeless Tobacco Never   Counseling given: Not Answered   Outpatient Encounter Medications as of 08/03/2021  Medication Sig   albuterol (VENTOLIN HFA) 108 (90 Base) MCG/ACT inhaler Inhale 2 puffs into the lungs every 6 (six) hours as needed for wheezing or shortness of breath.   ferrous sulfate 325 (65 FE) MG tablet Take 1 tablet (325 mg total) by mouth daily.   ibuprofen (ADVIL) 600 MG tablet Take 1 tablet (600 mg total) by mouth every 8 (eight) hours as needed.   acetaminophen (TYLENOL) 325 MG tablet Take 650 mg by mouth every 6 (six) hours as needed for fever.   benzonatate (TESSALON) 100 MG capsule Take 1 capsule (100 mg total) by mouth 2 (two) times daily as needed for cough. (Patient not taking: Reported on 04/29/2021)   citalopram (CELEXA) 10 MG tablet Take 1 tablet (10 mg total) by mouth daily. (Patient not taking: Reported on 12/02/2020)   fluconazole (DIFLUCAN) 150 MG tablet Take 1 tablet (150 mg total) by mouth daily. (Patient not taking: Reported on 07/28/2021)   fluconazole (DIFLUCAN) 150 MG tablet Take 1 tablet (150 mg total) by mouth once a  week for 2 doses.   sulfamethoxazole-trimethoprim (BACTRIM DS) 800-160 MG tablet Take 1 tablet by mouth 2 (two) times daily for 7 days.   No facility-administered encounter medications on file as of 08/03/2021.     Review of Systems  Review of Systems  Constitutional: Negative.   HENT: Negative.    Cardiovascular: Negative.   Gastrointestinal: Negative.   Musculoskeletal:        Pain and inflammation to hands  Allergic/Immunologic: Negative.   Neurological: Negative.   Psychiatric/Behavioral: Negative.         Physical Exam  BP 124/89 (BP Location: Right Arm, Patient Position: Sitting, Cuff Size: Normal)   Pulse 75   Temp 98 F (36.7 C)   Ht 5' 4.5" (1.638 m)   Wt 183 lb 4 oz (83.1 kg)   SpO2 99%   BMI 30.97 kg/m   Wt Readings from Last 5 Encounters:  08/03/21 183 lb 4 oz (83.1 kg)  07/28/21 187 lb (84.8 kg)  06/03/21 181 lb 6.4 oz (82.3 kg)  04/29/21 177 lb 6.4 oz (80.5 kg)  12/02/20 175 lb 0.8 oz (79.4 kg)     Physical Exam Vitals and nursing note reviewed.  Constitutional:      General: She is not in acute distress.    Appearance: She is well-developed.  Neck:     Thyroid: Thyromegaly present.  Cardiovascular:  Rate and Rhythm: Normal rate and regular rhythm.  Pulmonary:     Effort: Pulmonary effort is normal.     Breath sounds: Normal breath sounds.  Neurological:     Mental Status: She is alert and oriented to person, place, and time.      Lab Results:  CBC    Component Value Date/Time   WBC 10.0 04/29/2021 1151   WBC 8.2 09/13/2019 0240   RBC 4.28 04/29/2021 1151   RBC 3.91 09/13/2019 0240   HGB 12.1 04/29/2021 1151   HCT 37.9 04/29/2021 1151   PLT 375 04/29/2021 1151   MCV 89 04/29/2021 1151   MCH 28.3 04/29/2021 1151   MCH 29.9 09/13/2019 0240   MCHC 31.9 04/29/2021 1151   MCHC 32.9 09/13/2019 0240   RDW 14.1 04/29/2021 1151   LYMPHSABS 2.0 12/02/2020 1128   MONOABS 0.9 09/13/2019 0240   EOSABS 0.2 12/02/2020 1128   BASOSABS  0.0 12/02/2020 1128    BMET    Component Value Date/Time   NA 147 (H) 04/29/2021 1151   K 4.0 04/29/2021 1151   CL 106 04/29/2021 1151   CO2 26 04/29/2021 1151   GLUCOSE 79 04/29/2021 1151   GLUCOSE 118 (H) 09/13/2019 0240   BUN 10 04/29/2021 1151   CREATININE 0.64 04/29/2021 1151   CALCIUM 8.9 04/29/2021 1151   GFRNONAA 112 12/01/2019 1222   GFRAA 130 12/01/2019 1222    BNP    Component Value Date/Time   BNP 18.9 09/08/2019 2241    ProBNP No results found for: "PROBNP"  Imaging: No results found.   Assessment & Plan:   Goiter - US THYROID; Future  2. Pain and inflammation hands  - will order ibuprofen   Follow up:  Follow up in 6 months or sooner if needed     Fenton Foy, NP 08/03/2021

## 2021-12-30 ENCOUNTER — Telehealth: Payer: Self-pay | Admitting: Nurse Practitioner

## 2022-01-06 ENCOUNTER — Ambulatory Visit (INDEPENDENT_AMBULATORY_CARE_PROVIDER_SITE_OTHER): Payer: Self-pay | Admitting: Nurse Practitioner

## 2022-01-06 ENCOUNTER — Encounter: Payer: Self-pay | Admitting: Nurse Practitioner

## 2022-01-06 VITALS — BP 121/84 | Temp 98.1°F | Ht 64.5 in | Wt 183.0 lb

## 2022-01-06 DIAGNOSIS — N760 Acute vaginitis: Secondary | ICD-10-CM

## 2022-01-06 DIAGNOSIS — R208 Other disturbances of skin sensation: Secondary | ICD-10-CM | POA: Insufficient documentation

## 2022-01-06 LAB — POCT URINALYSIS DIPSTICK
Bilirubin, UA: NEGATIVE
Glucose, UA: NEGATIVE
Ketones, UA: NEGATIVE
Leukocytes, UA: NEGATIVE
Nitrite, UA: NEGATIVE
Protein, UA: NEGATIVE
Spec Grav, UA: >=1.03 — AB (ref 1.010–1.025)
Urobilinogen, UA: 0.2 E.U./dL
pH, UA: 6 (ref 5.0–8.0)

## 2022-01-06 MED ORDER — FLUCONAZOLE 150 MG PO TABS: 150.0000 mg | ORAL_TABLET | Freq: Once | ORAL | 0 refills | Status: AC

## 2022-01-06 NOTE — Progress Notes (Signed)
Acute Office Visit  Subjective:     Patient ID: Teresa Orozco, female    DOB: 05-04-1975, 46 y.o.   MRN: [id number]  Chief Complaint  Patient presents with   Vaginal Itching    Pt stated--vaginal itching, burning sensation when urinating, cramping lower stomach, right hip pain--1 week.    Vaginal Itching Associated symptoms include abdominal pain and dysuria. Pertinent negatives include no back pain, chills, constipation, fever, frequency, headaches, nausea, urgency or vomiting.   Teresa Orozco with past medical history of ASCUS with positive high risk HPV, right otitis media, goiter presents with complaints of vaginal itching, burning sensation with urination, lower abdominal cramping, vaginal odor with whitish-yellowish discharge since the past 1 week.  She has been taking Tylenol as needed for her pain.  She has 1 sexual partner.  She denies fever, chills, malaise.  She was treated for vaginal candidiasis some months ago   Review of Systems  Constitutional:  Negative for chills, fever, malaise/fatigue and weight loss.  Respiratory:  Negative for cough, hemoptysis, sputum production and shortness of breath.   Cardiovascular:  Negative for chest pain, palpitations, orthopnea, claudication and leg swelling.  Gastrointestinal:  Positive for abdominal pain. Negative for blood in stool, constipation, nausea and vomiting.  Genitourinary:  Positive for dysuria. Negative for frequency and urgency.  Musculoskeletal:  Negative for back pain, myalgias and neck pain.  Neurological:  Negative for dizziness, tingling, tremors, sensory change and headaches.  Psychiatric/Behavioral:  Negative for hallucinations, memory loss and substance abuse. The patient is not nervous/anxious and does not have insomnia.         Objective:    BP 121/84   Temp 98.1 F (36.7 C)   Ht 5' 4.5" (1.638 m)   Wt 183 lb (83 kg)   BMI 30.93 kg/m    Physical Exam Constitutional:      General: She is not in acute  distress.    Appearance: She is obese. She is not ill-appearing, toxic-appearing or diaphoretic.  Eyes:     General: No scleral icterus.       Right eye: No discharge.        Left eye: No discharge.  Cardiovascular:     Rate and Rhythm: Normal rate and regular rhythm.     Pulses: Normal pulses.     Heart sounds: Normal heart sounds. No murmur heard.    No friction rub. No gallop.  Pulmonary:     Effort: No respiratory distress.     Breath sounds: No stridor. No wheezing, rhonchi or rales.  Chest:     Chest wall: No tenderness.  Abdominal:     General: There is no distension.     Palpations: Abdomen is soft. There is no mass.     Tenderness: There is abdominal tenderness. There is no guarding or rebound.     Hernia: No hernia is present.  Musculoskeletal:        General: No swelling, tenderness, deformity or signs of injury.     Right lower leg: No edema.     Left lower leg: No edema.  Skin:    General: Skin is warm and dry.     Coloration: Skin is not jaundiced or pale.     Findings: No bruising or erythema.  Neurological:     Mental Status: She is alert and oriented to person, place, and time.     Sensory: No sensory deficit.     Motor: No weakness.     Coordination:  Coordination normal.     Gait: Gait normal.  Psychiatric:        Mood and Affect: Mood normal.        Behavior: Behavior normal.        Thought Content: Thought content normal.        Judgment: Judgment normal.     Results for orders placed or performed in visit on 01/06/22  POCT urinalysis dipstick  Result Value Ref Range   Color, UA yellow    Clarity, UA clear    Glucose, UA Negative Negative   Bilirubin, UA neg    Ketones, UA neg    Spec Grav, UA >=1.030 (A) 1.010 - 1.025   Blood, UA large    pH, UA 6.0 5.0 - 8.0   Protein, UA Negative Negative   Urobilinogen, UA 0.2 0.2 or 1.0 E.U./dL   Nitrite, UA neg    Leukocytes, UA Negative Negative   Appearance     Odor          Assessment &  Plan:   Problem List Items Addressed This Visit       Genitourinary   Acute vaginitis - Primary    Urine is negative for nitrite, leukocytes.  Has some blood in her urine - POCT urinalysis dipstick - NuSwab Vaginitis Plus (VG+)  Patient encouraged to avoid use of harsh soap, scented soap while cleaning her vaginal..  wear breathable underwear. She should drink at least 64 ounces of water daily to maintain hydration.  Will empirically treat her with fluconazole 150 mg one-time dose since its the holiday weekend.       Relevant Medications   fluconazole (DIFLUCAN) 150 MG tablet   Other Relevant Orders   POCT urinalysis dipstick (Completed)   NuSwab Vaginitis Plus (VG+)    Meds ordered this encounter  Medications   fluconazole (DIFLUCAN) 150 MG tablet    Sig: Take 1 tablet (150 mg total) by mouth once for 1 dose.    Dispense:  1 tablet    Refill:  0    No follow-ups on file.  Donell Beers, FNP

## 2022-01-06 NOTE — Assessment & Plan Note (Addendum)
Urine is negative for nitrite, leukocytes.  Has some blood in her urine - POCT urinalysis dipstick - NuSwab Vaginitis Plus (VG+)  Patient encouraged to avoid use of harsh soap, scented soap while cleaning her vaginal..  wear breathable underwear. She should drink at least 64 ounces of water daily to maintain hydration.  Will empirically treat her with fluconazole 150 mg one-time dose since its the holiday weekend.

## 2022-01-06 NOTE — Patient Instructions (Addendum)
  Acute vaginitis  - fluconazole (DIFLUCAN) 150 MG tablet; Take 1 tablet (150 mg total) by mouth once for 1 dose.  Dispense: 1 tablet; Refill: 0   No use ropa ajustada, como pantimedias o pantalones ajustados. Use ropa interior de algodn, que permite el paso del aire. No use duchas vaginales, jabn perfumado, cremas ni talcos. Cuando vaya al bao, siempre higiencese de adelante [employer]. Si tiene diabetes, mantenga bajo control los niveles de [employer]. Pregntele al mdico otras maneras de prevenir las infecciones por levaduras    Thanks for choosing [facility] we consider it a privelige to serve you.

## 2022-01-11 LAB — NUSWAB VAGINITIS PLUS (VG+)
Candida albicans, NAA: NEGATIVE
Candida glabrata, NAA: NEGATIVE
Chlamydia trachomatis, NAA: NEGATIVE
Neisseria gonorrhoeae, NAA: NEGATIVE
Trich vag by NAA: NEGATIVE

## 2022-01-11 NOTE — Progress Notes (Signed)
Her tests were negative for STI, UTI. If patient continues to have symptoms will refer her to OBGYN. I observed that she has always had blood in her urine each time we do  a urinalysis. Please discus referral to nephrology for further work up, is she agrees to the plan, kindly put in the referral. Thanks

## 2022-01-11 NOTE — Telephone Encounter (Signed)
error 

## 2022-01-12 ENCOUNTER — Encounter: Payer: Self-pay | Admitting: *Deleted

## 2022-02-02 ENCOUNTER — Other Ambulatory Visit: Payer: Self-pay

## 2022-02-02 DIAGNOSIS — R319 Hematuria, unspecified: Secondary | ICD-10-CM

## 2022-02-03 ENCOUNTER — Ambulatory Visit: Payer: Self-pay | Admitting: Nurse Practitioner

## 2022-03-06 ENCOUNTER — Ambulatory Visit (INDEPENDENT_AMBULATORY_CARE_PROVIDER_SITE_OTHER): Payer: Self-pay | Admitting: Nurse Practitioner

## 2022-03-06 ENCOUNTER — Encounter: Payer: Self-pay | Admitting: Nurse Practitioner

## 2022-03-06 VITALS — BP 139/81 | HR 69 | Temp 97.4°F | Ht 63.0 in | Wt 186.4 lb

## 2022-03-06 DIAGNOSIS — R0602 Shortness of breath: Secondary | ICD-10-CM

## 2022-03-06 DIAGNOSIS — E040 Nontoxic diffuse goiter: Secondary | ICD-10-CM

## 2022-03-06 DIAGNOSIS — Z0000 Encounter for general adult medical examination without abnormal findings: Secondary | ICD-10-CM

## 2022-03-06 DIAGNOSIS — Z1329 Encounter for screening for other suspected endocrine disorder: Secondary | ICD-10-CM

## 2022-03-06 DIAGNOSIS — Z8616 Personal history of COVID-19: Secondary | ICD-10-CM

## 2022-03-06 DIAGNOSIS — E559 Vitamin D deficiency, unspecified: Secondary | ICD-10-CM

## 2022-03-06 NOTE — Patient Instructions (Signed)
1. Thyroid disorder screen  - Thyroid Panel With TSH; Future  2. Routine adult health maintenance  - CBC - Comprehensive metabolic panel - Vitamin 123456  3. Vitamin D deficiency  - Vitamin D, 25-hydroxy  4. Goiter diffuse  - Ambulatory referral to Endocrinology  5. Shortness of breath  - DG Chest 2 View  6. History of COVID-19  - DG Chest 2 View  Follow up:  Follow up in 3 months

## 2022-03-06 NOTE — Progress Notes (Signed)
$@PatientM$  ID: Teresa Orozco, female    DOB: 1975/10/30, 47 y.o.   MRN: [id number]  Chief Complaint  Patient presents with   Follow-up    Over  all health with fatigue     Referring provider: Fenton Foy, NP   HPI  47 year old female with history of pneumonia, goiter, muscle spasms, history of COVID.   Patient presents today for follow-up visit.  Patient complains today of ongoing fatigue.  She did have COVID requiring hospitalization in 2021.  CT did show pneumonia at that time.  It also showed goiter.  Patient has been ordered follow-up thyroid ultrasound and chest x-ray which she has failed to complete.  We discussed that she does need to have these done.  We will refer her to endocrinology for further workup and evaluation on goiter.  Patient refuses to go for chest x-ray today.  We will check blood work today. Denies f/c/s, n/v/d, hemoptysis, PND, leg swelling Denies chest pain or edema    No Known Allergies  There is no immunization history for the selected administration types on file for this patient.  Past Medical History:  Diagnosis Date   Medical history non-contributory     Tobacco History: Social History   Tobacco Use  Smoking Status Never  Smokeless Tobacco Never   Counseling given: Not Answered   Outpatient Encounter Medications as of 03/06/2022  Medication Sig   acetaminophen (TYLENOL) 325 MG tablet Take 650 mg by mouth every 6 (six) hours as needed for fever.   albuterol (VENTOLIN HFA) 108 (90 Base) MCG/ACT inhaler Inhale 2 puffs into the lungs every 6 (six) hours as needed for wheezing or shortness of breath.   citalopram (CELEXA) 10 MG tablet Take 1 tablet (10 mg total) by mouth daily. (Patient not taking: Reported on 12/02/2020)   ferrous sulfate 325 (65 FE) MG tablet Take 1 tablet (325 mg total) by mouth daily. (Patient not taking: Reported on 03/06/2022)   ibuprofen (ADVIL) 600 MG tablet Take 1 tablet (600 mg total) by mouth every 8 (eight) hours as  needed. (Patient not taking: Reported on 01/06/2022)   No facility-administered encounter medications on file as of 03/06/2022.     Review of Systems  Review of Systems  Constitutional: Negative.   HENT: Negative.    Cardiovascular: Negative.   Gastrointestinal: Negative.   Allergic/Immunologic: Negative.   Neurological: Negative.   Psychiatric/Behavioral: Negative.         Physical Exam  BP 139/81   Pulse 69   Temp (!) 97.4 F (36.3 C)   Ht 5' 3"$  (1.6 m)   Wt 186 lb 6.4 oz (84.6 kg)   SpO2 100%   BMI 33.02 kg/m   Wt Readings from Last 5 Encounters:  03/06/22 186 lb 6.4 oz (84.6 kg)  01/06/22 183 lb (83 kg)  08/03/21 183 lb 4 oz (83.1 kg)  07/28/21 187 lb (84.8 kg)  06/03/21 181 lb 6.4 oz (82.3 kg)     Physical Exam Vitals and nursing note reviewed.  Constitutional:      General: She is not in acute distress.    Appearance: She is well-developed.  Cardiovascular:     Rate and Rhythm: Normal rate and regular rhythm.  Pulmonary:     Effort: Pulmonary effort is normal.     Breath sounds: Normal breath sounds.  Neurological:     Mental Status: She is alert and oriented to person, place, and time.      Lab Results:  CBC  Component Value Date/Time   WBC 7.5 03/06/2022 0927   WBC 8.2 09/13/2019 0240   RBC 4.15 03/06/2022 0927   RBC 3.91 09/13/2019 0240   HGB 12.0 03/06/2022 0927   HCT 35.6 03/06/2022 0927   PLT 370 03/06/2022 0927   MCV 86 03/06/2022 0927   MCH 28.9 03/06/2022 0927   MCH 29.9 09/13/2019 0240   MCHC 33.7 03/06/2022 0927   MCHC 32.9 09/13/2019 0240   RDW 14.3 03/06/2022 0927   LYMPHSABS 2.0 12/02/2020 1128   MONOABS 0.9 09/13/2019 0240   EOSABS 0.2 12/02/2020 1128   BASOSABS 0.0 12/02/2020 1128    BMET    Component Value Date/Time   NA 140 03/06/2022 0927   K 4.0 03/06/2022 0927   CL 106 03/06/2022 0927   CO2 22 03/06/2022 0927   GLUCOSE 96 03/06/2022 0927   GLUCOSE 118 (H) 09/13/2019 0240   BUN 8 03/06/2022 0927    CREATININE 0.53 (L) 03/06/2022 0927   CALCIUM 8.6 (L) 03/06/2022 0927   GFRNONAA 112 12/01/2019 1222   GFRAA 130 12/01/2019 1222    BNP    Component Value Date/Time   BNP 18.9 09/08/2019 2241    ProBNP No results found for: "PROBNP"  Imaging: No results found.   Assessment & Plan:   Thyroid disorder screen - Thyroid Panel With TSH; Future  2. Routine adult health maintenance  - CBC - Comprehensive metabolic panel - Vitamin 123456  3. Vitamin D deficiency  - Vitamin D, 25-hydroxy  4. Goiter diffuse  - Ambulatory referral to Endocrinology  5. Shortness of breath  - DG Chest 2 View  6. History of COVID-19  - DG Chest 2 View  Follow up:  Follow up in 3 months     Fenton Foy, NP 03/08/2022

## 2022-03-07 LAB — CBC
Hematocrit: 35.6 % (ref 34.0–46.6)
Hemoglobin: 12 g/dL (ref 11.1–15.9)
MCH: 28.9 pg (ref 26.6–33.0)
MCHC: 33.7 g/dL (ref 31.5–35.7)
MCV: 86 fL (ref 79–97)
Platelets: 370 10*3/uL (ref 150–450)
RBC: 4.15 x10E6/uL (ref 3.77–5.28)
RDW: 14.3 % (ref 11.7–15.4)
WBC: 7.5 10*3/uL (ref 3.4–10.8)

## 2022-03-07 LAB — COMPREHENSIVE METABOLIC PANEL
ALT: 16 IU/L (ref 0–32)
AST: 16 IU/L (ref 0–40)
Albumin/Globulin Ratio: 1.6 (ref 1.2–2.2)
Albumin: 4 g/dL (ref 3.9–4.9)
Alkaline Phosphatase: 81 IU/L (ref 44–121)
BUN/Creatinine Ratio: 15 (ref 9–23)
BUN: 8 mg/dL (ref 6–24)
Bilirubin Total: 0.5 mg/dL (ref 0.0–1.2)
CO2: 22 mmol/L (ref 20–29)
Calcium: 8.6 mg/dL — ABNORMAL LOW (ref 8.7–10.2)
Chloride: 106 mmol/L (ref 96–106)
Creatinine, Ser: 0.53 mg/dL — ABNORMAL LOW (ref 0.57–1.00)
Globulin, Total: 2.5 g/dL (ref 1.5–4.5)
Glucose: 96 mg/dL (ref 70–99)
Potassium: 4 mmol/L (ref 3.5–5.2)
Sodium: 140 mmol/L (ref 134–144)
Total Protein: 6.5 g/dL (ref 6.0–8.5)
eGFR: 115 mL/min/{1.73_m2} (ref >59)

## 2022-03-07 LAB — VITAMIN B12: Vitamin B-12: 829 pg/mL (ref 232–1245)

## 2022-03-07 LAB — VITAMIN D 25 HYDROXY (VIT D DEFICIENCY, FRACTURES): Vit D, 25-Hydroxy: 7 ng/mL — ABNORMAL LOW (ref 30.0–100.0)

## 2022-03-08 ENCOUNTER — Encounter: Payer: Self-pay | Admitting: Nurse Practitioner

## 2022-03-08 DIAGNOSIS — Z1329 Encounter for screening for other suspected endocrine disorder: Secondary | ICD-10-CM | POA: Insufficient documentation

## 2022-03-08 NOTE — Assessment & Plan Note (Signed)
-   Thyroid Panel With TSH; Future  2. Routine adult health maintenance  - CBC - Comprehensive metabolic panel - Vitamin 123456  3. Vitamin D deficiency  - Vitamin D, 25-hydroxy  4. Goiter diffuse  - Ambulatory referral to Endocrinology  5. Shortness of breath  - DG Chest 2 View  6. History of COVID-19  - DG Chest 2 View  Follow up:  Follow up in 3 months

## 2022-06-05 ENCOUNTER — Ambulatory Visit: Payer: Self-pay | Admitting: Nurse Practitioner

## 2023-06-08 ENCOUNTER — Encounter: Payer: Self-pay | Admitting: Nurse Practitioner

## 2023-06-08 ENCOUNTER — Ambulatory Visit (INDEPENDENT_AMBULATORY_CARE_PROVIDER_SITE_OTHER): Payer: Self-pay | Admitting: Nurse Practitioner

## 2023-06-08 VITALS — BP 138/91 | HR 86 | Temp 98.2°F | Wt 195.5 lb

## 2023-06-08 DIAGNOSIS — Z1329 Encounter for screening for other suspected endocrine disorder: Secondary | ICD-10-CM

## 2023-06-08 DIAGNOSIS — Z0000 Encounter for general adult medical examination without abnormal findings: Secondary | ICD-10-CM

## 2023-06-08 DIAGNOSIS — Z1211 Encounter for screening for malignant neoplasm of colon: Secondary | ICD-10-CM

## 2023-06-08 DIAGNOSIS — R0602 Shortness of breath: Secondary | ICD-10-CM

## 2023-06-08 DIAGNOSIS — E559 Vitamin D deficiency, unspecified: Secondary | ICD-10-CM

## 2023-06-08 DIAGNOSIS — Z8639 Personal history of other endocrine, nutritional and metabolic disease: Secondary | ICD-10-CM

## 2023-06-08 MED ORDER — ALBUTEROL SULFATE HFA 108 (90 BASE) MCG/ACT IN AERS: 2.0000 | INHALATION_SPRAY | Freq: Four times a day (QID) | RESPIRATORY_TRACT | 2 refills | Status: AC | PRN

## 2023-06-08 NOTE — Progress Notes (Signed)
 Subjective   Patient ID: Teresa Orozco, female    DOB: 1975-07-01, 48 y.o.   MRN: [id number]  Chief Complaint  Patient presents with   Medical Management of Chronic Issues    Patient would like her thyroid checked    Referring provider: Jerrlyn Morel, NP  Teresa Orozco is a 48 y.o. female with Past Medical History: No date: Medical history non-contributory   HPI  Patient presents today for an acute visit.  She states that she has been having numbness and swelling to her bilateral hands.  She thinks this is due to her thyroid and would like to have this checked.  Upon chart review patient does have a history of a goiter that she is unaware of.  We will order ultrasound of her thyroid and check thyroid levels today.  We discussed that the numbness and swelling to her hands could be due to a separate issue such as carpal tunnel.  Patient does also have a history of vitamin D  deficiency.  We will recheck this today.  Patient is due for colon cancer screening.  We will order Cologuard today. Denies f/c/s, n/v/d, hemoptysis, PND, leg swelling Denies chest pain or edema   Numbness and swelling in hands   No Known Allergies  There is no immunization history for the selected administration types on file for this patient.  Tobacco History: Social History   Tobacco Use  Smoking Status Never  Smokeless Tobacco Never   Counseling given: Not Answered   Outpatient Encounter Medications as of 06/08/2023  Medication Sig   acetaminophen  (TYLENOL ) 325 MG tablet Take 650 mg by mouth every 6 (six) hours as needed for fever. (Patient not taking: Reported on 06/08/2023)   albuterol  (VENTOLIN  HFA) 108 (90 Base) MCG/ACT inhaler Inhale 2 puffs into the lungs every 6 (six) hours as needed for wheezing or shortness of breath.   citalopram  (CELEXA ) 10 MG tablet Take 1 tablet (10 mg total) by mouth daily. (Patient not taking: Reported on 06/08/2023)   ferrous sulfate  325 (65 FE) MG tablet Take 1 tablet  (325 mg total) by mouth daily. (Patient not taking: Reported on 03/06/2022)   ibuprofen  (ADVIL ) 600 MG tablet Take 1 tablet (600 mg total) by mouth every 8 (eight) hours as needed. (Patient not taking: Reported on 06/08/2023)   [DISCONTINUED] albuterol  (VENTOLIN  HFA) 108 (90 Base) MCG/ACT inhaler Inhale 2 puffs into the lungs every 6 (six) hours as needed for wheezing or shortness of breath. (Patient not taking: Reported on 06/08/2023)   No facility-administered encounter medications on file as of 06/08/2023.    Review of Systems  Review of Systems  Constitutional: Negative.   HENT: Negative.    Cardiovascular: Negative.   Gastrointestinal: Negative.   Allergic/Immunologic: Negative.   Neurological: Negative.   Psychiatric/Behavioral: Negative.       Objective:   BP (!) 138/91   Pulse 86   Temp 98.2 F (36.8 C) (Oral)   Wt 195 lb 8 oz (88.7 kg)   SpO2 100%   BMI 34.63 kg/m   Wt Readings from Last 5 Encounters:  06/08/23 195 lb 8 oz (88.7 kg)  03/06/22 186 lb 6.4 oz (84.6 kg)  01/06/22 183 lb (83 kg)  08/03/21 183 lb 4 oz (83.1 kg)  07/28/21 187 lb (84.8 kg)     Physical Exam Vitals and nursing note reviewed.  Constitutional:      General: She is not in acute distress.    Appearance: She is well-developed.  Cardiovascular:  Rate and Rhythm: Normal rate and regular rhythm.  Pulmonary:     Effort: Pulmonary effort is normal.     Breath sounds: Normal breath sounds.  Neurological:     Mental Status: She is alert and oriented to person, place, and time.       Assessment & Plan:   Screen for colon cancer -     Cologuard  Thyroid disorder screen -     Thyroid Panel With TSH  Vitamin D  deficiency -     VITAMIN D  25 Hydroxy (Vit-D Deficiency, Fractures)  Routine adult health maintenance -     CBC -     Comprehensive metabolic panel with GFR  History of goiter -     US  THYROID; Future -     Thyroid Panel With TSH  Shortness of breath -     Albuterol   Sulfate HFA; Inhale 2 puffs into the lungs every 6 (six) hours as needed for wheezing or shortness of breath.  Dispense: 8 g; Refill: 2     Return in about 4 weeks (around 07/06/2023) for follow up thyroid, weight gain, goiter, hand swelling.   Jerrlyn Morel, NP 06/08/2023

## 2023-06-08 NOTE — Patient Instructions (Signed)
 1. Screen for colon cancer (Primary)  - Cologuard  2. Thyroid disorder screen  - Thyroid Panel With TSH  3. Vitamin D  deficiency  - Vitamin D , 25-hydroxy  4. Routine adult health maintenance  - CBC - Comprehensive metabolic panel with GFR  5. History of goiter  - US  THYROID; Future - Thyroid Panel With TSH  6. Shortness of breath  - albuterol  (VENTOLIN  HFA) 108 (90 Base) MCG/ACT inhaler; Inhale 2 puffs into the lungs every 6 (six) hours as needed for wheezing or shortness of breath.  Dispense: 8 g; Refill: 2

## 2023-06-09 LAB — THYROID PANEL WITH TSH
Free Thyroxine Index: 1.5 (ref 1.2–4.9)
T3 Uptake Ratio: 21 % — ABNORMAL LOW (ref 24–39)
T4, Total: 7 ug/dL (ref 4.5–12.0)
TSH: 1.58 u[IU]/mL (ref 0.450–4.500)

## 2023-06-09 LAB — COMPREHENSIVE METABOLIC PANEL WITH GFR
ALT: 18 IU/L (ref 0–32)
AST: 21 IU/L (ref 0–40)
Albumin: 4.2 g/dL (ref 3.9–4.9)
Alkaline Phosphatase: 81 IU/L (ref 44–121)
BUN/Creatinine Ratio: 17 (ref 9–23)
BUN: 12 mg/dL (ref 6–24)
Bilirubin Total: 0.4 mg/dL (ref 0.0–1.2)
CO2: 21 mmol/L (ref 20–29)
Calcium: 9 mg/dL (ref 8.7–10.2)
Chloride: 103 mmol/L (ref 96–106)
Creatinine, Ser: 0.69 mg/dL (ref 0.57–1.00)
Globulin, Total: 2.7 g/dL (ref 1.5–4.5)
Glucose: 90 mg/dL (ref 70–99)
Potassium: 4.4 mmol/L (ref 3.5–5.2)
Sodium: 138 mmol/L (ref 134–144)
Total Protein: 6.9 g/dL (ref 6.0–8.5)
eGFR: 107 mL/min/{1.73_m2} (ref >59)

## 2023-06-09 LAB — CBC
Hematocrit: 38.9 % (ref 34.0–46.6)
Hemoglobin: 12.4 g/dL (ref 11.1–15.9)
MCH: 29.5 pg (ref 26.6–33.0)
MCHC: 31.9 g/dL (ref 31.5–35.7)
MCV: 93 fL (ref 79–97)
Platelets: 331 10*3/uL (ref 150–450)
RBC: 4.2 x10E6/uL (ref 3.77–5.28)
RDW: 15.4 % (ref 11.7–15.4)
WBC: 9.5 10*3/uL (ref 3.4–10.8)

## 2023-06-09 LAB — VITAMIN D 25 HYDROXY (VIT D DEFICIENCY, FRACTURES): Vit D, 25-Hydroxy: 7.6 ng/mL — ABNORMAL LOW (ref 30.0–100.0)

## 2023-06-12 ENCOUNTER — Other Ambulatory Visit: Payer: Self-pay | Admitting: Nurse Practitioner

## 2023-06-12 ENCOUNTER — Ambulatory Visit: Payer: Self-pay | Admitting: Nurse Practitioner

## 2023-06-12 DIAGNOSIS — E559 Vitamin D deficiency, unspecified: Secondary | ICD-10-CM

## 2023-06-12 MED ORDER — VITAMIN D (ERGOCALCIFEROL) 1.25 MG (50000 UNIT) PO CAPS: 50000.0000 [IU] | ORAL_CAPSULE | ORAL | 2 refills | Status: AC

## 2023-07-09 ENCOUNTER — Ambulatory Visit (INDEPENDENT_AMBULATORY_CARE_PROVIDER_SITE_OTHER): Payer: Self-pay | Admitting: Nurse Practitioner

## 2023-07-09 ENCOUNTER — Encounter: Payer: Self-pay | Admitting: Nurse Practitioner

## 2023-07-09 VITALS — BP 139/89 | HR 75 | Wt 194.0 lb

## 2023-07-09 DIAGNOSIS — Z1211 Encounter for screening for malignant neoplasm of colon: Secondary | ICD-10-CM

## 2023-07-09 DIAGNOSIS — Z3689 Encounter for other specified antenatal screening: Secondary | ICD-10-CM

## 2023-07-09 DIAGNOSIS — N926 Irregular menstruation, unspecified: Secondary | ICD-10-CM

## 2023-07-09 LAB — POCT URINE PREGNANCY: Preg Test, Ur: NEGATIVE

## 2023-07-09 NOTE — Patient Instructions (Signed)
 Menopausia: qu debe saber Menopause: What to Know La menopausia es el momento de la vida de una mujer en el que los perodos menstruales se detienen. Marca el final de su capacidad para quedar embarazada. Se puede definir como no tener el perodo durante 12 meses sin otra causa mdica. La etapa en que comienza a pasar a la menopausia se llama perimenopausia. Suele ocurrir [employer] 45 y los 55 aos. Puede durar [employer]. Durante la perimenopausia, los niveles hormonales del cuerpo [place]. Esto puede causar sntomas y Audiological scientist a Radiographer, therapeutic. La menopausia puede hacer que sea ms propensa a tener lo siguiente: [employer], que se quiebran con ms facilidad. Depresin. Esta ocurre cuando se siente tristeza o desesperanza. Endurecimiento y Scientist, research (medical) de las arterias. Estos pueden causar infarto de miocardio y accidente cerebrovascular. Cules son las causas? En la [employer], la menopausia es un cambio natural del cuerpo y los niveles hormonales que ocurre con el paso del [place]. Pero en algunos casos, puede deberse a cambios que no son naturales. Incluyen lo siguiente: [place] para [employer]. Efectos secundarios de algunos medicamentos. Qu incrementa el riesgo? Es ms probable que pase por la menopausia de forma temprana si: Tiene un crecimiento anormal (tumor) de la hipfisis en el cerebro. Tiene una enfermedad que [employer] ovarios. Recibe determinados tratamientos para el cncer. Incluyen lo siguiente: Quimioterapia. Terapia hormonal. Radioterapia en la zona que est entre las caderas (pelvis). Fuma mucho o bebe mucho alcohol. Otras personas de su familia han pasado por la menopausia de forma temprana. Es muy delgada. Cules son los signos o sntomas? Es posible que tenga: [place]. Perodos irregulares. Sudoracin nocturna. Cambios en cmo se siente con respecto al sexo. Usted puede: Tener menos deseo sexual. Sentir ms incomodidad  en torno a su sexualidad. Sequedad vaginal y adelgazamiento de las paredes de la vagina. Esto puede hacer que tener sexo le genere dolor. Cambios en la piel, por ejemplo: Piel seca. Arrugas nuevas. Dolores de [place]. Otros sntomas pueden incluir: Dificultad para dormir. Cambios en el estado de nimo. Problemas de memoria. Aumento de [place]. Crecimiento de vello en la cara y el pecho. Infecciones en la vejiga o dificultad para orinar. Cmo se diagnostica? Pueden diagnosticarle esta afeccin en funcin de lo siguiente: Sus antecedentes mdicos. Un examen. Su edad. Sus antecedentes de [employer]. Sus sntomas. Pruebas hormonales. Cmo se trata? En algunos los casos, no se necesita tratamiento. Hable con su mdico acerca de si debera recibir algn tratamiento. Los tratamientos pueden incluir lo siguiente: Terapia hormonal para la menopausia (THM). Medicamentos para tratar ciertos sntomas. Acupuntura. Vitaminas o suplementos herbales. Antes de Microbiologist, informe al mdico si usted o alguien de su familia tiene o ha tenido: Enfermedad cardaca. Cncer de mama. Cogulos de [place]. Diabetes. Osteoporosis. Siga estas instrucciones en su casa: Comida y bebida  Consuma una dieta equilibrada. Debe incluir: Nils Pyle y verduras frescas. Cereales integrales. Protenas magras. Lcteos con bajo contenido de [place]. Consuma muchos alimentos que contengan calcio y vitamina D. Estos pueden ayudar a [employer]. Los alimentos y bebidas con alto contenido de calcio y hierro son, Eusebio Me otros: [place] y [employer]. Frijoles. Almendras. Sardinas. Brcoli y col rizada. Para ayudar a prevenir los acaloramientos, no consuma lo siguiente: Alcohol. Bebidas con cafena. Comidas condimentadas. Estilo de vida No fume, vapee ni consuma nicotina o tabaco. Intente dormir de 7 a 8 horas todas las noches. Si tiene acaloramientos, es recomendable que: Se  vista  con capas de ropa. Evite las cosas que podran [employer] acaloramientos, como los lugares calientes o el estrs. Respire profundamente y despacio cuando comience a Scientist, water quality. Tenga un ventilador en su casa y en su oficina. Busque maneras de Charity fundraiser. Tal vez deba intentar lo siguiente: Respiracin profunda. Meditacin. Escribir un diario. Consulte al [employer] terapia grupal. La terapia puede ayudarla a obtener apoyo de otras personas que estn pasando por la menopausia. Instrucciones generales  Hable con su mdico antes de tomar cualquier suplemento a base de hierbas. Lleve un registro de los sntomas. Registre: Cundo comienzan. Con qu frecuencia los tiene. Cunto tiempo duran. Use lubricantes o humectantes vaginales. Estos pueden ayudar con: Sequedad vaginal. La comodidad durante el sexo. Comunquese con un mdico si: Es mayor de 84 aos y an tiene Environmental manager. Siente dolor durante el sexo. No ha tenido un perodo menstrual durante 12 meses y luego comienza a Geophysicist/field seismologist por la vagina. Siente dolor al [employer]. Tiene dolores de [place] muy intensos. Solicite ayuda de inmediato si: Est muy deprimida. Tiene mucho sangrado de la vagina. [employer] late [place] rpido. Siente un dolor muy intenso en el vientre o indigestin que no se alivia con medicamentos. Esta informacin no tiene Theme park manager el consejo del mdico. Asegrese de hacerle al mdico cualquier pregunta que tenga. Document Revised: 10/12/2022 Document Reviewed: 10/12/2022 Elsevier Patient Education  2024 [employer].

## 2023-07-09 NOTE — Progress Notes (Signed)
 Subjective   Patient ID: Teresa Orozco, female    DOB: 01-01-1976, 48 y.o.   MRN: [id number]  Chief Complaint  Patient presents with   Medical Management of Chronic Issues    Referring provider: Oley Bascom RAMAN, NP  Teresa Orozco is a 48 y.o. female with Past Medical History: No date: Medical history non-contributory   HPI  Patient presents today for a follow-up visit.  We discussed labs from last visit.  Patient does need to get an ultrasound for her thyroid .  The imaging center did call her but she did not return her call.  She states that she will call them back and schedule the appointment.  She is concerned about menopause.  She did not have a period for 6 months and started back this week.  She is concerned for pregnancy.  We discussed that urine pregnancy test in office today was negative.  We will place a referral to OB/GYN for follow-up. Denies f/c/s, n/v/d, hemoptysis, PND, leg swelling Denies chest pain or edema     No Known Allergies  There is no immunization history for the selected administration types on file for this patient.  Tobacco History: Social History   Tobacco Use  Smoking Status Never  Smokeless Tobacco Never   Counseling given: Not Answered   Outpatient Encounter Medications as of 07/09/2023  Medication Sig   acetaminophen  (TYLENOL ) 325 MG tablet Take 650 mg by mouth every 6 (six) hours as needed for fever. (Patient not taking: Reported on 06/08/2023)   albuterol  (VENTOLIN  HFA) 108 (90 Base) MCG/ACT inhaler Inhale 2 puffs into the lungs every 6 (six) hours as needed for wheezing or shortness of breath.   citalopram  (CELEXA ) 10 MG tablet Take 1 tablet (10 mg total) by mouth daily. (Patient not taking: Reported on 06/08/2023)   ferrous sulfate  325 (65 FE) MG tablet Take 1 tablet (325 mg total) by mouth daily. (Patient not taking: Reported on 03/06/2022)   ibuprofen  (ADVIL ) 600 MG tablet Take 1 tablet (600 mg total) by mouth every 8 (eight) hours as  needed. (Patient not taking: Reported on 06/08/2023)   Vitamin D , Ergocalciferol , (DRISDOL ) 1.25 MG (50000 UNIT) CAPS capsule Take 1 capsule (50,000 Units total) by mouth every 7 (seven) days.   No facility-administered encounter medications on file as of 07/09/2023.    Review of Systems  Review of Systems  Constitutional: Negative.   HENT: Negative.    Cardiovascular: Negative.   Gastrointestinal: Negative.   Genitourinary:  Positive for menstrual problem.  Allergic/Immunologic: Negative.   Neurological: Negative.   Psychiatric/Behavioral: Negative.       Objective:   BP 139/89   Pulse 75   Wt 194 lb (88 kg)   SpO2 99%   BMI 34.37 kg/m   Wt Readings from Last 5 Encounters:  07/09/23 194 lb (88 kg)  06/08/23 195 lb 8 oz (88.7 kg)  03/06/22 186 lb 6.4 oz (84.6 kg)  01/06/22 183 lb (83 kg)  08/03/21 183 lb 4 oz (83.1 kg)     Physical Exam Vitals and nursing note reviewed.  Constitutional:      General: She is not in acute distress.    Appearance: She is well-developed.   Cardiovascular:     Rate and Rhythm: Normal rate and regular rhythm.  Pulmonary:     Effort: Pulmonary effort is normal.     Breath sounds: Normal breath sounds.   Neurological:     Mental Status: She is alert and oriented to person,  place, and time.       Assessment & Plan:   Screening for colon cancer -     Cologuard  Screening for pregnancy-associated plasma protein A -     POCT urine pregnancy  Irregular periods -     Ambulatory referral to Obstetrics / Gynecology     Return in about 3 months (around 10/09/2023).   Bascom GORMAN Borer, NP 07/09/2023

## 2024-01-09 ENCOUNTER — Ambulatory Visit: Payer: Self-pay | Admitting: Nurse Practitioner
